# Patient Record
Sex: Female | Born: 1960
Health system: Southern US, Community
[De-identification: ages and names within clinical notes are randomized; demographics above are authoritative.]

## PROBLEM LIST (undated history)

## (undated) ENCOUNTER — Ambulatory Visit

## (undated) DIAGNOSIS — IMO0002 Reserved for concepts with insufficient information to code with codable children: Secondary | ICD-10-CM

## (undated) DIAGNOSIS — Z803 Family history of malignant neoplasm of breast: Secondary | ICD-10-CM

## (undated) DIAGNOSIS — Z800 Family history of malignant neoplasm of digestive organs: Secondary | ICD-10-CM

## (undated) DIAGNOSIS — R011 Cardiac murmur, unspecified: Secondary | ICD-10-CM

## (undated) DIAGNOSIS — E785 Hyperlipidemia, unspecified: Secondary | ICD-10-CM

## (undated) DIAGNOSIS — I10 Essential (primary) hypertension: Secondary | ICD-10-CM

## (undated) DIAGNOSIS — E119 Type 2 diabetes mellitus without complications: Secondary | ICD-10-CM

## (undated) DIAGNOSIS — R42 Dizziness and giddiness: Secondary | ICD-10-CM

## (undated) DIAGNOSIS — Z8041 Family history of malignant neoplasm of ovary: Secondary | ICD-10-CM

## (undated) DIAGNOSIS — K219 Gastro-esophageal reflux disease without esophagitis: Secondary | ICD-10-CM

## (undated) DIAGNOSIS — T7840XA Allergy, unspecified, initial encounter: Secondary | ICD-10-CM

## (undated) DIAGNOSIS — Z807 Family history of other malignant neoplasms of lymphoid, hematopoietic and related tissues: Secondary | ICD-10-CM

## (undated) DIAGNOSIS — R87619 Unspecified abnormal cytological findings in specimens from cervix uteri: Secondary | ICD-10-CM

## (undated) DIAGNOSIS — N939 Abnormal uterine and vaginal bleeding, unspecified: Secondary | ICD-10-CM

## (undated) HISTORY — DX: Hyperlipidemia, unspecified: E78.5

## (undated) HISTORY — DX: Family history of malignant neoplasm of breast: Z80.3

## (undated) HISTORY — PX: TUBAL LIGATION: SHX77

## (undated) HISTORY — DX: Type 2 diabetes mellitus without complications: E11.9

## (undated) HISTORY — DX: Family history of other malignant neoplasms of lymphoid, hematopoietic and related tissues: Z80.7

## (undated) HISTORY — PX: KNEE ARTHROSCOPY W/ ACL RECONSTRUCTION: SHX1858

## (undated) HISTORY — PX: KNEE SURGERY: SHX244

## (undated) HISTORY — DX: Cardiac murmur, unspecified: R01.1

## (undated) HISTORY — PX: COLPOSCOPY: SHX161

## (undated) HISTORY — DX: Family history of malignant neoplasm of ovary: Z80.41

## (undated) HISTORY — PX: DILATION AND CURETTAGE OF UTERUS: SHX78

## (undated) HISTORY — DX: Reserved for concepts with insufficient information to code with codable children: IMO0002

## (undated) HISTORY — DX: Allergy, unspecified, initial encounter: T78.40XA

## (undated) HISTORY — DX: Unspecified abnormal cytological findings in specimens from cervix uteri: R87.619

## (undated) HISTORY — DX: Family history of malignant neoplasm of digestive organs: Z80.0

---

## 2000-02-06 ENCOUNTER — Encounter (INDEPENDENT_AMBULATORY_CARE_PROVIDER_SITE_OTHER): Payer: Self-pay | Admitting: Specialist

## 2000-02-06 ENCOUNTER — Other Ambulatory Visit: Admission: RE | Admit: 2000-02-06 | Discharge: 2000-02-06 | Payer: Self-pay | Admitting: *Deleted

## 2000-09-20 ENCOUNTER — Other Ambulatory Visit: Admission: RE | Admit: 2000-09-20 | Discharge: 2000-09-20 | Payer: Self-pay | Admitting: *Deleted

## 2001-11-13 ENCOUNTER — Other Ambulatory Visit: Admission: RE | Admit: 2001-11-13 | Discharge: 2001-11-13 | Payer: Self-pay | Admitting: *Deleted

## 2002-11-13 ENCOUNTER — Other Ambulatory Visit: Admission: RE | Admit: 2002-11-13 | Discharge: 2002-11-13 | Payer: Self-pay | Admitting: Obstetrics and Gynecology

## 2004-08-24 ENCOUNTER — Other Ambulatory Visit: Admission: RE | Admit: 2004-08-24 | Discharge: 2004-08-24 | Payer: Self-pay | Admitting: Obstetrics and Gynecology

## 2005-09-15 ENCOUNTER — Other Ambulatory Visit: Admission: RE | Admit: 2005-09-15 | Discharge: 2005-09-15 | Payer: Self-pay | Admitting: Obstetrics and Gynecology

## 2009-02-07 ENCOUNTER — Emergency Department (HOSPITAL_BASED_OUTPATIENT_CLINIC_OR_DEPARTMENT_OTHER): Admission: EM | Admit: 2009-02-07 | Discharge: 2009-02-07 | Payer: Self-pay | Admitting: Emergency Medicine

## 2009-05-04 ENCOUNTER — Ambulatory Visit: Payer: Self-pay | Admitting: Diagnostic Radiology

## 2009-05-04 ENCOUNTER — Emergency Department (HOSPITAL_BASED_OUTPATIENT_CLINIC_OR_DEPARTMENT_OTHER): Admission: EM | Admit: 2009-05-04 | Discharge: 2009-05-04 | Payer: Self-pay | Admitting: Emergency Medicine

## 2009-10-21 ENCOUNTER — Emergency Department (HOSPITAL_BASED_OUTPATIENT_CLINIC_OR_DEPARTMENT_OTHER): Admission: EM | Admit: 2009-10-21 | Discharge: 2009-10-21 | Payer: Self-pay | Admitting: Emergency Medicine

## 2011-02-14 ENCOUNTER — Emergency Department (INDEPENDENT_AMBULATORY_CARE_PROVIDER_SITE_OTHER): Payer: Self-pay

## 2011-02-14 ENCOUNTER — Emergency Department (HOSPITAL_BASED_OUTPATIENT_CLINIC_OR_DEPARTMENT_OTHER)
Admission: EM | Admit: 2011-02-14 | Discharge: 2011-02-14 | Disposition: A | Discharge status: Home / Self Care | Payer: Self-pay | Attending: Emergency Medicine | Admitting: Emergency Medicine

## 2011-02-14 DIAGNOSIS — M549 Dorsalgia, unspecified: Secondary | ICD-10-CM | POA: Insufficient documentation

## 2011-02-14 DIAGNOSIS — R0602 Shortness of breath: Secondary | ICD-10-CM | POA: Insufficient documentation

## 2011-02-14 DIAGNOSIS — M62838 Other muscle spasm: Secondary | ICD-10-CM | POA: Insufficient documentation

## 2011-02-14 DIAGNOSIS — E669 Obesity, unspecified: Secondary | ICD-10-CM | POA: Insufficient documentation

## 2011-02-14 DIAGNOSIS — M542 Cervicalgia: Secondary | ICD-10-CM | POA: Insufficient documentation

## 2011-02-14 LAB — POCT CARDIAC MARKERS: Myoglobin, poc: 68.7 ng/mL (ref 12–200)

## 2011-02-14 LAB — BASIC METABOLIC PANEL
CO2: 28 mEq/L (ref 19–32)
Calcium: 9.4 mg/dL (ref 8.4–10.5)
Chloride: 108 mEq/L (ref 96–112)
GFR calc Af Amer: >60 mL/min (ref >60)
Glucose, Bld: 110 mg/dL — ABNORMAL HIGH (ref 70–99)
Sodium: 144 mEq/L (ref 135–145)

## 2011-02-14 LAB — DIFFERENTIAL
Basophils Absolute: 0 10*3/uL (ref 0.0–0.1)
Basophils Relative: 0 % (ref 0–1)
Eosinophils Relative: 3 % (ref 0–5)
Lymphocytes Relative: 37 % (ref 12–46)
Monocytes Absolute: 0.6 10*3/uL (ref 0.1–1.0)
Monocytes Relative: 8 % (ref 3–12)

## 2011-02-14 LAB — CBC
HCT: 38 % (ref 36.0–46.0)
Hemoglobin: 12.7 g/dL (ref 12.0–15.0)
MCH: 29.1 pg (ref 26.0–34.0)
MCHC: 33.4 g/dL (ref 30.0–36.0)
RDW: 13.1 % (ref 11.5–15.5)

## 2011-03-15 LAB — URINALYSIS, ROUTINE W REFLEX MICROSCOPIC
Glucose, UA: NEGATIVE mg/dL
Hgb urine dipstick: NEGATIVE
Protein, ur: NEGATIVE mg/dL
Specific Gravity, Urine: 1.023 (ref 1.005–1.030)
Urobilinogen, UA: 1 mg/dL (ref 0.0–1.0)

## 2011-03-15 LAB — DIFFERENTIAL
Eosinophils Absolute: 0.3 10*3/uL (ref 0.0–0.7)
Lymphocytes Relative: 36 % (ref 12–46)
Lymphs Abs: 3 10*3/uL (ref 0.7–4.0)
Monocytes Relative: 7 % (ref 3–12)
Neutro Abs: 4.4 10*3/uL (ref 1.7–7.7)
Neutrophils Relative %: 52 % (ref 43–77)

## 2011-03-15 LAB — COMPREHENSIVE METABOLIC PANEL
ALT: 17 U/L (ref 0–35)
BUN: 13 mg/dL (ref 6–23)
CO2: 26 mEq/L (ref 19–32)
Calcium: 8.9 mg/dL (ref 8.4–10.5)
Creatinine, Ser: 0.7 mg/dL (ref 0.4–1.2)
GFR calc non Af Amer: >60 mL/min (ref >60)
Glucose, Bld: 86 mg/dL (ref 70–99)
Total Protein: 7.1 g/dL (ref 6.0–8.3)

## 2011-03-15 LAB — CBC
HCT: 38.5 % (ref 36.0–46.0)
Hemoglobin: 13.4 g/dL (ref 12.0–15.0)
MCHC: 34.6 g/dL (ref 30.0–36.0)
MCV: 87.8 fL (ref 78.0–100.0)
RBC: 4.39 MIL/uL (ref 3.87–5.11)
RDW: 13 % (ref 11.5–15.5)

## 2011-03-21 LAB — BASIC METABOLIC PANEL
Calcium: 9.3 mg/dL (ref 8.4–10.5)
Chloride: 108 mEq/L (ref 96–112)
Creatinine, Ser: 0.7 mg/dL (ref 0.4–1.2)
GFR calc Af Amer: >60 mL/min (ref >60)
Sodium: 142 mEq/L (ref 135–145)

## 2011-03-21 LAB — POCT CARDIAC MARKERS
CKMB, poc: 1.3 ng/mL (ref 1.0–8.0)
CKMB, poc: <1 ng/mL — ABNORMAL LOW (ref 1.0–8.0)
Myoglobin, poc: 99 ng/mL (ref 12–200)
Troponin i, poc: <0.05 ng/mL (ref 0.00–0.09)
Troponin i, poc: <0.05 ng/mL (ref 0.00–0.09)

## 2011-03-21 LAB — DIFFERENTIAL
Lymphs Abs: 2.4 10*3/uL (ref 0.7–4.0)
Monocytes Absolute: 0.5 10*3/uL (ref 0.1–1.0)
Monocytes Relative: 6 % (ref 3–12)
Neutro Abs: 4.9 10*3/uL (ref 1.7–7.7)
Neutrophils Relative %: 61 % (ref 43–77)

## 2011-03-21 LAB — CBC
Hemoglobin: 13.9 g/dL (ref 12.0–15.0)
RBC: 4.57 MIL/uL (ref 3.87–5.11)
WBC: 8.1 10*3/uL (ref 4.0–10.5)

## 2011-03-21 LAB — POCT B-TYPE NATRIURETIC PEPTIDE (BNP): B Natriuretic Peptide, POC: <5 pg/mL (ref 0–100)

## 2012-02-16 ENCOUNTER — Other Ambulatory Visit: Payer: Self-pay | Admitting: Obstetrics and Gynecology

## 2012-02-16 DIAGNOSIS — N6310 Unspecified lump in the right breast, unspecified quadrant: Secondary | ICD-10-CM

## 2012-02-20 ENCOUNTER — Ambulatory Visit (INDEPENDENT_AMBULATORY_CARE_PROVIDER_SITE_OTHER): Payer: Self-pay | Admitting: *Deleted

## 2012-02-20 VITALS — BP 122/86 | HR 72 | Temp 97.6°F | Ht 63.0 in | Wt 255.0 lb

## 2012-02-20 DIAGNOSIS — Z1239 Encounter for other screening for malignant neoplasm of breast: Secondary | ICD-10-CM

## 2012-02-20 NOTE — Patient Instructions (Signed)
Taught patient how to perform BSE and gave educational materials to take home. Patient did not need a Pap smear today due to last Pap smear was 02/05/12 at the free Pap smear screening at the [facility]. Patient stated she has a history of abnormal Pap smears. Told her based on her recent result will depend on the follow up needed and when next Pap smear will be recommended. Let patient know have not received Pap smear result and that will get a letter if normal and a phone call if abnormal. Let her know [facility] will cover follow up if needed.  Patient is scheduled for a diagnostic mammogram and possible right breast ultrasound tomorrow February 21, 2012 at 1430 at the [facility]. Patient aware of appointment and will be there. Let patient know will follow up with her within the next couple weeks with results. Patient verbalized understanding.

## 2012-02-20 NOTE — Progress Notes (Signed)
Patient referred to [facility] due to two lumps palpated at the free Breast screening at the [facility] on 02/05/12  Pap Smear:    Pap smear not performed today. Patients last Pap smear was 02/05/12 at the [facility] free Pap smear screening and awaiting results. Per patient has had multiple abnormal Pap smears. Patient stated prior to recent Pap smear that last Pap smear was in 2006 and was abnormal requiring a colposcopy for follow up. No Pap smear results in EPIC.  Physical exam: Breasts Breasts symmetrical. No skin abnormalities bilateral breasts. No nipple retraction bilateral breasts. No nipple discharge bilateral breasts. No lymphadenopathy. Palpated two small lumps in right breast around 9 o'clock next to areola and around 10 o'clock. See attached scanned report with description.Palpated a small pea sized lump in the left axilla. Patient complained of tenderness in the left axilla when palpated lump. Otherwise no complaints of pain or tenderness on exam. Patient referred to the [facility] for Diagnostic Mammogram and possible breast ultrasound Wednesday, February 21, 2012 at 1430.         Pelvic/Bimanual No Pap smear completed today since last Pap smear was 02/05/12. Pap smear not indicated per [facility] guidelines.

## 2012-02-21 ENCOUNTER — Ambulatory Visit
Admission: RE | Admit: 2012-02-21 | Discharge: 2012-02-21 | Disposition: A | Discharge status: Home / Self Care | Payer: No Typology Code available for payment source | Source: Ambulatory Visit | Attending: Obstetrics and Gynecology | Admitting: Obstetrics and Gynecology

## 2012-02-21 ENCOUNTER — Other Ambulatory Visit: Payer: Self-pay | Admitting: Obstetrics and Gynecology

## 2012-02-21 DIAGNOSIS — N6310 Unspecified lump in the right breast, unspecified quadrant: Secondary | ICD-10-CM

## 2012-02-27 ENCOUNTER — Telehealth: Payer: Self-pay | Admitting: *Deleted

## 2012-02-27 NOTE — Telephone Encounter (Signed)
Telephoned pt at home #. Spoke with patient and advised pap smear results were normal and diagnostic mammogram was benign.

## 2015-08-21 ENCOUNTER — Encounter (HOSPITAL_BASED_OUTPATIENT_CLINIC_OR_DEPARTMENT_OTHER): Payer: Self-pay | Admitting: Emergency Medicine

## 2015-08-21 ENCOUNTER — Emergency Department (HOSPITAL_BASED_OUTPATIENT_CLINIC_OR_DEPARTMENT_OTHER): Payer: Self-pay

## 2015-08-21 ENCOUNTER — Emergency Department (HOSPITAL_BASED_OUTPATIENT_CLINIC_OR_DEPARTMENT_OTHER)
Admission: EM | Admit: 2015-08-21 | Discharge: 2015-08-21 | Disposition: A | Discharge status: Home / Self Care | Payer: Self-pay | Attending: Emergency Medicine | Admitting: Emergency Medicine

## 2015-08-21 DIAGNOSIS — R42 Dizziness and giddiness: Secondary | ICD-10-CM | POA: Insufficient documentation

## 2015-08-21 DIAGNOSIS — R011 Cardiac murmur, unspecified: Secondary | ICD-10-CM | POA: Insufficient documentation

## 2015-08-21 DIAGNOSIS — Z79899 Other long term (current) drug therapy: Secondary | ICD-10-CM | POA: Insufficient documentation

## 2015-08-21 LAB — CBC
HCT: 41 % (ref 36.0–46.0)
Hemoglobin: 13.1 g/dL (ref 12.0–15.0)
MCH: 28.5 pg (ref 26.0–34.0)
MCHC: 32 g/dL (ref 30.0–36.0)
MCV: 89.1 fL (ref 78.0–100.0)
PLATELETS: 225 10*3/uL (ref 150–400)
RBC: 4.6 MIL/uL (ref 3.87–5.11)
RDW: 13.4 % (ref 11.5–15.5)
WBC: 7.9 10*3/uL (ref 4.0–10.5)

## 2015-08-21 LAB — DIFFERENTIAL
Basophils Absolute: 0 10*3/uL (ref 0.0–0.1)
Basophils Relative: 0 % (ref 0–1)
Eosinophils Absolute: 0.2 10*3/uL (ref 0.0–0.7)
Eosinophils Relative: 3 % (ref 0–5)
LYMPHS PCT: 48 % — AB (ref 12–46)
Lymphs Abs: 3.9 10*3/uL (ref 0.7–4.0)
Monocytes Absolute: 0.7 10*3/uL (ref 0.1–1.0)
Monocytes Relative: 9 % (ref 3–12)
NEUTROS PCT: 40 % — AB (ref 43–77)
Neutro Abs: 3.1 10*3/uL (ref 1.7–7.7)

## 2015-08-21 LAB — PROTIME-INR
INR: 0.95 (ref 0.00–1.49)
PROTHROMBIN TIME: 12.9 s (ref 11.6–15.2)

## 2015-08-21 LAB — COMPREHENSIVE METABOLIC PANEL
ALBUMIN: 3.6 g/dL (ref 3.5–5.0)
ALK PHOS: 51 U/L (ref 38–126)
ALT: 18 U/L (ref 14–54)
AST: 24 U/L (ref 15–41)
Anion gap: 7 (ref 5–15)
BUN: 7 mg/dL (ref 6–20)
CALCIUM: 8.8 mg/dL — AB (ref 8.9–10.3)
CO2: 28 mmol/L (ref 22–32)
CREATININE: 0.75 mg/dL (ref 0.44–1.00)
Chloride: 108 mmol/L (ref 101–111)
GFR calc Af Amer: >60 mL/min (ref >60)
GFR calc non Af Amer: >60 mL/min (ref >60)
GLUCOSE: 104 mg/dL — AB (ref 65–99)
Potassium: 3.3 mmol/L — ABNORMAL LOW (ref 3.5–5.1)
SODIUM: 143 mmol/L (ref 135–145)
Total Bilirubin: 0.4 mg/dL (ref 0.3–1.2)
Total Protein: 6.9 g/dL (ref 6.5–8.1)

## 2015-08-21 LAB — APTT: aPTT: 30 seconds (ref 24–37)

## 2015-08-21 LAB — TROPONIN I: Troponin I: <0.03 ng/mL (ref <0.031)

## 2015-08-21 LAB — CBG MONITORING, ED: Glucose-Capillary: 84 mg/dL (ref 65–99)

## 2015-08-21 MED ORDER — MECLIZINE HCL 25 MG PO TABS
50.0000 mg | ORAL_TABLET | Freq: Once | ORAL | Status: AC
  Administered 2015-08-21: 50 mg via ORAL
  Filled 2015-08-21: qty 2

## 2015-08-21 MED ORDER — ONDANSETRON HCL 4 MG/2ML IJ SOLN
4.0000 mg | Freq: Once | INTRAMUSCULAR | Status: AC
  Administered 2015-08-21: 4 mg via INTRAVENOUS
  Filled 2015-08-21: qty 2

## 2015-08-21 MED ORDER — PROMETHAZINE HCL 25 MG PO TABS: 25.0000 mg | ORAL_TABLET | Freq: Four times a day (QID) | ORAL | Status: DC | PRN

## 2015-08-21 MED ORDER — PROMETHAZINE HCL 25 MG/ML IJ SOLN
25.0000 mg | Freq: Once | INTRAMUSCULAR | Status: AC
  Administered 2015-08-21: 25 mg via INTRAVENOUS
  Filled 2015-08-21: qty 1

## 2015-08-21 MED ORDER — SODIUM CHLORIDE 0.9 % IV BOLUS (SEPSIS)
1000.0000 mL | Freq: Once | INTRAVENOUS | Status: AC
  Administered 2015-08-21: 1000 mL via INTRAVENOUS

## 2015-08-21 NOTE — ED Notes (Signed)
MD at bedside. 

## 2015-08-21 NOTE — Discharge Instructions (Signed)
Return without fail for worsening symptoms, including speech or vision changes, difficulty walking, or any other symptoms concerning to you.  Benign Positional Vertigo Vertigo means you feel like you or your surroundings are moving when they are not. Benign positional vertigo is the most common form of vertigo. Benign means that the cause of your condition is not serious. Benign positional vertigo is more common in older adults. CAUSES  Benign positional vertigo is the result of an upset in the labyrinth system. This is an area in the middle ear that helps control your balance. This may be caused by a viral infection, head injury, or repetitive motion. However, often no specific cause is found. SYMPTOMS  Symptoms of benign positional vertigo occur when you move your head or eyes in different directions. Some of the symptoms may include:  Loss of balance and falls.  Vomiting.  Blurred vision.  Dizziness.  Nausea.  Involuntary eye movements (nystagmus). DIAGNOSIS  Benign positional vertigo is usually diagnosed by physical exam. If the specific cause of your benign positional vertigo is unknown, your caregiver may perform imaging tests, such as magnetic resonance imaging (MRI) or computed tomography (CT). TREATMENT  Your caregiver may recommend movements or procedures to correct the benign positional vertigo. Medicines such as meclizine, benzodiazepines, and medicines for nausea may be used to treat your symptoms. In rare cases, if your symptoms are caused by certain conditions that affect the inner ear, you may need surgery. HOME CARE INSTRUCTIONS   Follow your caregiver's instructions.  Move slowly. Do not make sudden body or head movements.  Avoid driving.  Avoid operating heavy machinery.  Avoid performing any tasks that would be dangerous to you or others during a vertigo episode.  Drink enough fluids to keep your urine clear or pale yellow. SEEK IMMEDIATE MEDICAL CARE IF:    You develop problems with walking, weakness, numbness, or using your arms, hands, or legs.  You have difficulty speaking.  You develop severe headaches.  Your nausea or vomiting continues or gets worse.  You develop visual changes.  Your family or friends notice any behavioral changes.  Your condition gets worse.  You have a fever.  You develop a stiff neck or sensitivity to light. MAKE SURE YOU:   Understand these instructions.  Will watch your condition.  Will get help right away if you are not doing well or get worse. Document Released: 09/04/2006 Document Revised: 02/19/2012 Document Reviewed: 08/17/2011 [facility] Patient Information 2015 [place], [place]. This information is not intended to replace advice given to you by your health care provider. Make sure you discuss any questions you have with your health care provider.  Dizziness  Dizziness means you feel unsteady or lightheaded. You might feel like you are going to pass out (faint). HOME CARE   Drink enough fluids to keep your pee (urine) clear or pale yellow.  Take your medicines exactly as told by your doctor. If you take blood pressure medicine, always stand up slowly from the lying or sitting position. Hold on to something to steady yourself.  If you need to stand in one place for a long time, move your legs often. Tighten and relax your leg muscles.  Have someone stay with you until you feel okay.  Do not drive or use heavy machinery if you feel dizzy.  Do not drink alcohol. GET HELP RIGHT AWAY IF:   You feel dizzy or lightheaded and it gets worse.  You feel sick to your stomach (nauseous), or you  throw up (vomit).  You have trouble talking or walking.  You feel weak or have trouble using your arms, hands, or legs.  You cannot think clearly or have trouble forming sentences.  You have chest pain, belly (abdominal) pain, sweating, or you are short of breath.  Your vision changes.  You are  bleeding.  You have problems from your medicine that seem to be getting worse. MAKE SURE YOU:   Understand these instructions.  Will watch your condition.  Will get help right away if you are not doing well or get worse. Document Released: 11/16/2011 Document Revised: 02/19/2012 Document Reviewed: 11/16/2011 [facility] Patient Information 2015 [place], [place]. This information is not intended to replace advice given to you by your health care provider. Make sure you discuss any questions you have with your health care provider.

## 2015-08-21 NOTE — ED Notes (Signed)
Dizziness & nausea resolved

## 2015-08-21 NOTE — ED Notes (Signed)
Pt states she has felt dizzy off and on since about 1110 this morning on her way to work, along with nausea and "a funny feeling" in both eyes.

## 2015-08-21 NOTE — ED Notes (Signed)
Patient transported to CT 

## 2015-08-21 NOTE — ED Provider Notes (Signed)
CSN: [id number]     Arrival date & time 08/21/15  1252 History   First MD Initiated Contact with Patient 08/21/15 1339     Chief Complaint  Patient presents with  . Dizziness     (Consider location/radiation/quality/duration/timing/severity/associated sxs/prior Treatment) HPI 54 year old female who presents with dizziness. History of obesity. Reports that around 11 AM today, she experienced intermittent dizziness. Reports that whenever she would hitting her head backwards related backwards she would have the sensation that the room was spinning. This is associated with diaphoresis and nausea. She had some associating gait instability due to the dizziness. Reports that this would get better, but worse again when she would lay her head backwards. Reports that in 2010 she was diagnosed with vertigo, but this was not a recurrent issue and feels like symptoms today may be similar. Has also had recent mild cold with congestion and runny nose, but symptoms have now resolved. Denies any headache, double vision, dysarthria or aphasia, focal weakness or numbness, or dysphasia. Denies any recent trauma.    Past Medical History  Diagnosis Date  . Abnormal Pap smear     colposcopy  . Heart murmur    Past Surgical History  Procedure Laterality Date  . Dilation and curettage of uterus    . Knee surgery    . Colposcopy     Family History  Problem Relation Age of Onset  . Heart disease Paternal Grandmother   . Hypertension Paternal Grandmother   . Diabetes Maternal Grandmother   . Hypertension Maternal Grandmother   . Cancer Father     colon  . Breast cancer Mother   . Heart disease Mother   . Diabetes Mother   . Hypertension Mother   . Heart disease Brother   . Diabetes Brother   . Diabetes Sister   . Diabetes Brother   . Hodgkin's lymphoma Sister    Social History  Substance Use Topics  . Smoking status: Never Smoker   . Smokeless tobacco: Never Used  . Alcohol Use: No   OB  History    Gravida Para Term Preterm AB TAB SAB Ectopic Multiple Living   3 3 3       3      Review of Systems 10/14 systems reviewed and are negative other than those stated in the HPI   Allergies  Iodine  Home Medications   Prior to Admission medications   Medication Sig Start Date End Date Taking? Authorizing Provider  Multiple Vitamin (MULTIVITAMIN) tablet Take 1 tablet by mouth daily.    Historical Provider, MD   BP 116/74 mmHg  Pulse 62  Temp(Src) 98.5 F (36.9 C) (Oral)  Resp 19  Ht 5\' 3"  (1.6 m)  Wt 278 lb (126.1 kg)  BMI 49.26 kg/m2  SpO2 100%  LMP  (LMP Unknown) Physical Exam Physical Exam  Nursing note and vitals reviewed. Constitutional: Well developed, well nourished, non-toxic, and in no acute distress Head: Normocephalic and atraumatic.  Mouth/Throat: Oropharynx is clear and moist.  Neck: Normal range of motion. Neck supple.  Cardiovascular: Normal rate and regular rhythm.   Pulmonary/Chest: Effort normal and breath sounds normal.  Abdominal: Soft. There is no tenderness. There is no rebound and no guarding.  Musculoskeletal: Normal range of motion.  Skin: Skin is warm and dry.  Psychiatric: Cooperative Neurological:  Alert, oriented to person, place, time, and situation. Memory grossly in tact. Fluent speech. No dysarthria or aphasia.  Cranial nerves: VF are full. Fundoscopic exam-unable to get good  visualization of the discs. Pupils are symmetric, and reactive to light. EOMI without nystagmus. No abnormal test of skew. No gaze deviation. Facial muscles symmetric with activation. Sensation to light touch over face in tact bilaterally. Hearing grossly in tact. Palate elevates symmetrically. Head turn and shoulder shrug are intact. Tongue midline.  Muscle bulk and tone normal. No pronator drift. Moves all extremities symmetrically. No drift with leg raise bilaterally. Sensation to light touch is in tact throughout in bilateral upper and lower  extremities. Coordination reveals no dysmetria with finger to nose. Gait is narrow-based and steady. Non-ataxic.   ED Course  Procedures (including critical care time) Labs Review Labs Reviewed  DIFFERENTIAL - Abnormal; Notable for the following:    Neutrophils Relative % 40 (*)    Lymphocytes Relative 48 (*)    All other components within normal limits  COMPREHENSIVE METABOLIC PANEL - Abnormal; Notable for the following:    Potassium 3.3 (*)    Glucose, Bld 104 (*)    Calcium 8.8 (*)    All other components within normal limits  PROTIME-INR  APTT  CBC  TROPONIN I  CBG MONITORING, ED    Imaging Review Ct Head Wo Contrast  08/21/2015   CLINICAL DATA:  Dizziness and nausea  EXAM: CT HEAD WITHOUT CONTRAST  TECHNIQUE: Contiguous axial images were obtained from the base of the skull through the vertex without intravenous contrast.  COMPARISON:  None.  FINDINGS: No acute hemorrhage, infarct, or mass lesion is identified. No midline shift. Ventricles are normal in size. Orbits are unremarkable. No skull fracture. Mild ethmoid sinusitis.  IMPRESSION: No acute intracranial finding.   Electronically Signed   By: Conchita Paris M.D.   On: 08/21/2015 14:28   I have personally reviewed and evaluated these images and lab results as part of my medical decision-making.   EKG Interpretation   Date/Time:  Saturday August 21 2015 13:17:00 EDT Ventricular Rate:  62 PR Interval:  158 QRS Duration: 94 QT Interval:  446 QTC Calculation: 452 R Axis:   46 Text Interpretation:  Normal sinus rhythm Normal ECG No significant change  since last tracing Confirmed by Danette Weinfeld MD, Hinton Dyer ([id number]) on 08/21/2015 1:17:26  PM      MDM   Final diagnoses:  Vertigo    In short, this is a 54 year old female who presents with dizziness, consistent with vertigo. Vital signs are within normal limits on arrival. She is not toxic and in no acute distress. Symptoms reproducible when she is laid flat. Her  neurological exam is intact. No bidirectional or vertical nystagmus, no abnormal test of skew, no dysmetria or abnormal coordination testing, and no ataxia with ambulation. Findings are not consistent with central etiology of her vertigo. The patient has history of peripheral vertigo, and given that symptoms are induced by certain positional changes, this is most consistent with peripheral vertigo.`CT head in triage is unremarkable, and I do not suspect CVA or TIA. Blood work is unremarkable. She is given meclizine and Phenergan for symptoms, and continues to be able to ambulate steadily. She is felt appropriate for discharge home with supportive care. Strict return follow-up instructions are reviewed. She expressed understanding of all discharge instructions and felt comfortable to plan of care.   Forde Dandy, MD 08/21/15 1758

## 2017-05-21 ENCOUNTER — Other Ambulatory Visit: Payer: Self-pay

## 2017-05-25 LAB — CYTOLOGY - PAP: Diagnosis: NEGATIVE

## 2017-07-15 ENCOUNTER — Emergency Department (HOSPITAL_BASED_OUTPATIENT_CLINIC_OR_DEPARTMENT_OTHER): Payer: Self-pay

## 2017-07-15 ENCOUNTER — Encounter (HOSPITAL_BASED_OUTPATIENT_CLINIC_OR_DEPARTMENT_OTHER): Payer: Self-pay | Admitting: Adult Health

## 2017-07-15 ENCOUNTER — Emergency Department (HOSPITAL_BASED_OUTPATIENT_CLINIC_OR_DEPARTMENT_OTHER)
Admission: EM | Admit: 2017-07-15 | Discharge: 2017-07-15 | Disposition: A | Discharge status: Home / Self Care | Payer: Self-pay | Attending: Emergency Medicine | Admitting: Emergency Medicine

## 2017-07-15 DIAGNOSIS — H81399 Other peripheral vertigo, unspecified ear: Secondary | ICD-10-CM

## 2017-07-15 DIAGNOSIS — R42 Dizziness and giddiness: Secondary | ICD-10-CM | POA: Insufficient documentation

## 2017-07-15 HISTORY — DX: Dizziness and giddiness: R42

## 2017-07-15 LAB — BASIC METABOLIC PANEL
ANION GAP: 6 (ref 5–15)
BUN: 7 mg/dL (ref 6–20)
CALCIUM: 8.8 mg/dL — AB (ref 8.9–10.3)
CO2: 28 mmol/L (ref 22–32)
CREATININE: 0.68 mg/dL (ref 0.44–1.00)
Chloride: 106 mmol/L (ref 101–111)
GFR calc non Af Amer: >60 mL/min (ref >60)
Glucose, Bld: 121 mg/dL — ABNORMAL HIGH (ref 65–99)
Potassium: 3.5 mmol/L (ref 3.5–5.1)
SODIUM: 140 mmol/L (ref 135–145)

## 2017-07-15 LAB — CBC WITH DIFFERENTIAL/PLATELET
BASOS ABS: 0 10*3/uL (ref 0.0–0.1)
BASOS PCT: 1 %
EOS ABS: 0.2 10*3/uL (ref 0.0–0.7)
Eosinophils Relative: 3 %
HEMATOCRIT: 41.7 % (ref 36.0–46.0)
Hemoglobin: 13.6 g/dL (ref 12.0–15.0)
Lymphocytes Relative: 41 %
Lymphs Abs: 2.6 10*3/uL (ref 0.7–4.0)
MCH: 29.2 pg (ref 26.0–34.0)
MCHC: 32.6 g/dL (ref 30.0–36.0)
MCV: 89.5 fL (ref 78.0–100.0)
Monocytes Absolute: 0.5 10*3/uL (ref 0.1–1.0)
Monocytes Relative: 8 %
NEUTROS ABS: 3 10*3/uL (ref 1.7–7.7)
NEUTROS PCT: 47 %
Platelets: 224 10*3/uL (ref 150–400)
RBC: 4.66 MIL/uL (ref 3.87–5.11)
RDW: 13.4 % (ref 11.5–15.5)
WBC: 6.3 10*3/uL (ref 4.0–10.5)

## 2017-07-15 MED ORDER — LORAZEPAM 1 MG PO TABS: 1.0000 mg | ORAL_TABLET | Freq: Three times a day (TID) | ORAL | 0 refills | Status: DC | PRN

## 2017-07-15 MED ORDER — MECLIZINE HCL 25 MG PO TABS
25.0000 mg | ORAL_TABLET | Freq: Once | ORAL | Status: AC
  Administered 2017-07-15: 25 mg via ORAL
  Filled 2017-07-15: qty 1

## 2017-07-15 NOTE — ED Notes (Signed)
Pt on auto VS  

## 2017-07-15 NOTE — Discharge Instructions (Signed)
Ativan as prescribed as needed for dizziness.  Follow-up with your primary Dr. if not improving in the next week.

## 2017-07-15 NOTE — ED Provider Notes (Signed)
[facility] Provider Note   CSN: [id number] Arrival date & time: 07/15/17  1258     History   Chief Complaint Chief Complaint  Patient presents with  . Dizziness    HPI Anna Mann is a 56 y.o. female.  Patient is a 56 year old female with past medical history of vertigo. She presents today for evaluation of dizziness. She reports a spinning sensation that occurs primarily when she lies on her left side. It also occurs when she turns her head to the left. She denies any weakness, numbness of the extremities. She denies any visual disturbances. She denies any headache.   The history is provided by the patient.  Dizziness  Quality:  Head spinning and imbalance Severity:  Moderate Onset quality:  Sudden Duration:  8 hours Timing:  Intermittent Progression:  Worsening Chronicity:  New Context: head movement   Relieved by:  Being still Worsened by:  Movement Ineffective treatments:  None tried Associated symptoms: nausea   Associated symptoms: no blood in stool, no chest pain and no shortness of breath     Past Medical History:  Diagnosis Date  . Abnormal Pap smear    colposcopy  . Heart murmur   . Vertigo     There are no active problems to display for this patient.   Past Surgical History:  Procedure Laterality Date  . COLPOSCOPY    . DILATION AND CURETTAGE OF UTERUS    . KNEE SURGERY      OB History    Gravida Para Term Preterm AB Living   3 3 3     3    SAB TAB Ectopic Multiple Live Births                   Home Medications    Prior to Admission medications   Medication Sig Start Date End Date Taking? Authorizing Provider  Multiple Vitamin (MULTIVITAMIN) tablet Take 1 tablet by mouth daily.    [provider]  promethazine (PHENERGAN) 25 MG tablet Take 1 tablet (25 mg total) by mouth every 6 (six) hours as needed for nausea or vomiting (dizziness). 08/21/15   Forde Dandy, MD    Family History Family History  Problem  Relation Age of Onset  . Heart disease Paternal Grandmother   . Hypertension Paternal Grandmother   . Diabetes Maternal Grandmother   . Hypertension Maternal Grandmother   . Cancer Father        colon  . Breast cancer Mother   . Heart disease Mother   . Diabetes Mother   . Hypertension Mother   . Diabetes Sister   . Diabetes Brother   . Hodgkin's lymphoma Sister   . Heart disease Brother   . Diabetes Brother     Social History Social History  Substance Use Topics  . Smoking status: Never Smoker  . Smokeless tobacco: Never Used  . Alcohol use No     Allergies   Iodine   Review of Systems Review of Systems  Respiratory: Negative for shortness of breath.   Cardiovascular: Negative for chest pain.  Gastrointestinal: Positive for nausea. Negative for blood in stool.  Neurological: Positive for dizziness.  All other systems reviewed and are negative.    Physical Exam Updated Vital Signs BP (!) 150/91 (BP Location: Left Arm)   Pulse 83   Temp 98.6 F (37 C) (Oral)   Resp 18   Ht 5\' 3"  (1.6 m)   Wt 131.5 kg (290 lb)  LMP  (LMP Unknown) Comment: irregular  SpO2 97%   BMI 51.37 kg/m   Physical Exam  Constitutional: She is oriented to person, place, and time. She appears well-developed and well-nourished. No distress.  HENT:  Head: Normocephalic and atraumatic.  Eyes: Pupils are equal, round, and reactive to light. EOM are normal.  Neck: Normal range of motion. Neck supple.  Cardiovascular: Normal rate and regular rhythm.  Exam reveals no gallop and no friction rub.   No murmur heard. Pulmonary/Chest: Effort normal and breath sounds normal. No respiratory distress. She has no wheezes.  Abdominal: Soft. Bowel sounds are normal. She exhibits no distension. There is no tenderness.  Musculoskeletal: Normal range of motion.  Neurological: She is alert and oriented to person, place, and time. No cranial nerve deficit. She exhibits normal muscle tone. Coordination  normal.  Skin: Skin is warm and dry. She is not diaphoretic.  Nursing note and vitals reviewed.    ED Treatments / Results  Labs (all labs ordered are listed, but only abnormal results are displayed) Labs Reviewed  BASIC METABOLIC PANEL  CBC WITH DIFFERENTIAL/PLATELET    EKG  EKG Interpretation  Date/Time:  Sunday July 15 2017 13:17:32 EDT Ventricular Rate:  69 PR Interval:  166 QRS Duration: 92 QT Interval:  418 QTC Calculation: 447 R Axis:   43 Text Interpretation:  Normal sinus rhythm Normal ECG Confirmed by Veryl Speak [phone]) on 07/15/2017 1:31:49 PM       Radiology Ct Head Wo Contrast  Result Date: 07/15/2017 CLINICAL DATA:  Ataxia and dizziness beginning this morning. Suspected stroke. EXAM: CT HEAD WITHOUT CONTRAST TECHNIQUE: Contiguous axial images were obtained from the base of the skull through the vertex without intravenous contrast. COMPARISON:  08/21/2015 FINDINGS: Brain: No evidence of acute infarction, hemorrhage, hydrocephalus, extra-axial collection, or mass lesion/mass effect. Vascular:  No hyperdense vessel or other acute findings. Skull: No evidence of fracture or other significant bone abnormality. Sinuses/Orbits:  No acute findings. Other: Increased size of 1 cm polypoid mass in the left frontal scalp, which has nonspecific features. IMPRESSION: No acute intracranial abnormality. Increased size of 1 cm polypoid soft tissue mass in the left frontal scalp. Recommend dermatologic consultation and consider surgical excision. Electronically Signed   By: Earle Gell M.D.   On: 07/15/2017 14:40    Procedures Procedures (including critical care time)  Medications Ordered in ED Medications  meclizine (ANTIVERT) tablet 25 mg (25 mg Oral Given 07/15/17 1434)     Initial Impression / Assessment and Plan / ED Course  I have reviewed the triage vital signs and the nursing notes.  Pertinent labs & imaging results that were available during my care of the patient  were reviewed by me and considered in my medical decision making (see chart for details).  Patient presents with symptoms that are most consistent with peripheral vertigo. She is neurologically intact, head CT is negative, and laboratory studies are unremarkable. She was given meclizine, however it itched. She will be discharged with Ativan as needed for recurrent vertigo and follow-up as needed if she is not improving.  Final Clinical Impressions(s) / ED Diagnoses   Final diagnoses:  None    New Prescriptions New Prescriptions   No medications on file     Veryl Speak, MD 07/15/17 [phone]

## 2017-07-15 NOTE — ED Triage Notes (Signed)
PREsents with dizziness that occurs when patiemt is lying on left side only and then when she stands she feels off balance. She deneis numbness, tingling and weakness. NO facial droop or arm drift. Dizziness began this AM with waking. and is worse with lying on left side and when eyes are closed.  Denies pain.

## 2017-07-15 NOTE — ED Notes (Signed)
Pt c/o facial and LLE itching; no hives noted; NAD; EDP at bedside.

## 2017-08-10 ENCOUNTER — Encounter: Payer: Self-pay | Admitting: Obstetrics & Gynecology

## 2017-10-19 ENCOUNTER — Encounter (HOSPITAL_COMMUNITY): Payer: Self-pay

## 2018-10-20 IMAGING — CT CT HEAD W/O CM
3 series · 15 of 46 positions shown, 18 images · non-contrast
Comparison: 08/21/2015

CLINICAL DATA: Ataxia and dizziness beginning this morning.
Suspected stroke.

EXAM:
CT HEAD WITHOUT CONTRAST
TECHNIQUE: Contiguous axial images were obtained from the base of the skull
through the vertex without intravenous contrast.

[Series 2: head wo · axial · 0.39mm/px · z∈[-10,+110]mm · 9 of 29 slices shown, 12 images]
[im 3/29  brain]
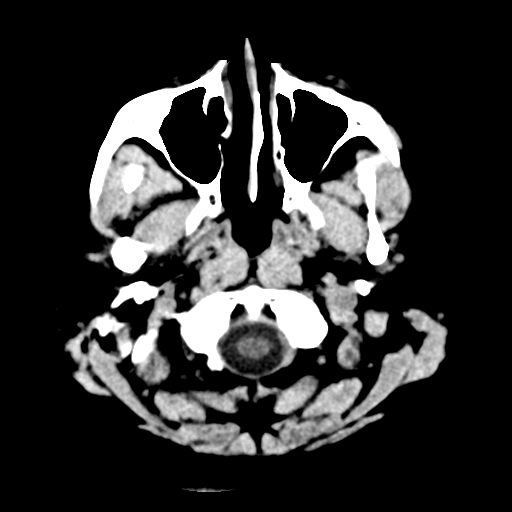
[im 3/29  bone]
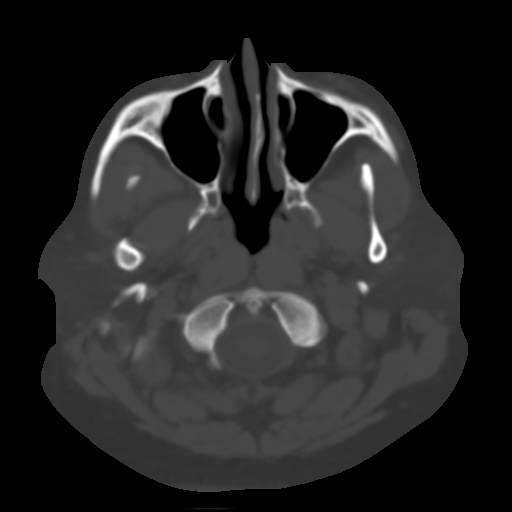
[im 6/29  brain]
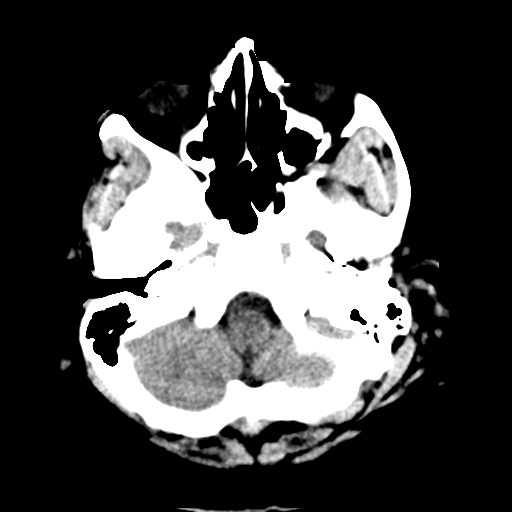
[im 9/29  brain]
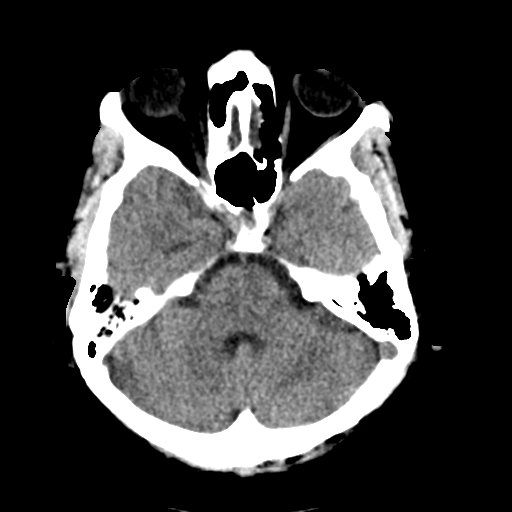
[im 12/29  brain]
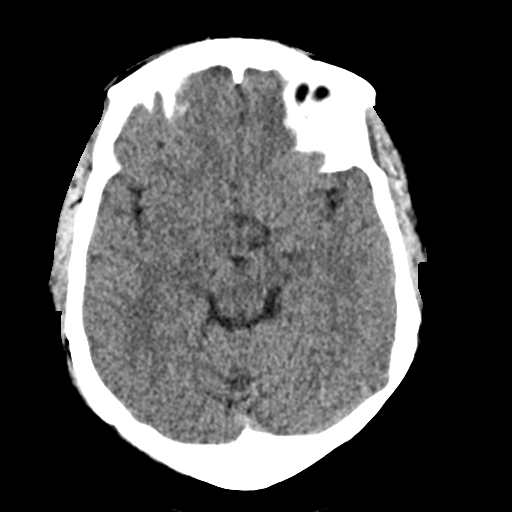
[im 15/29  brain]
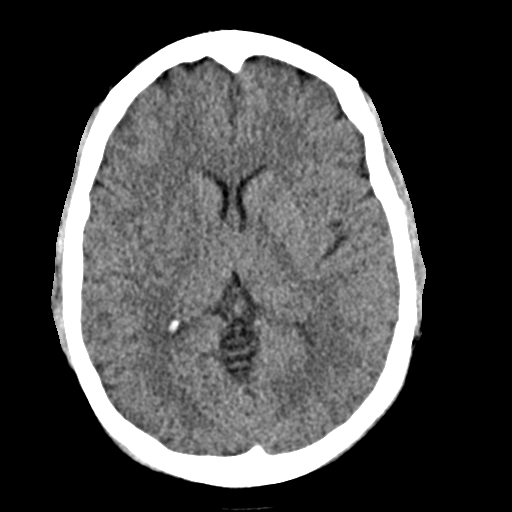
[im 15/29  bone]
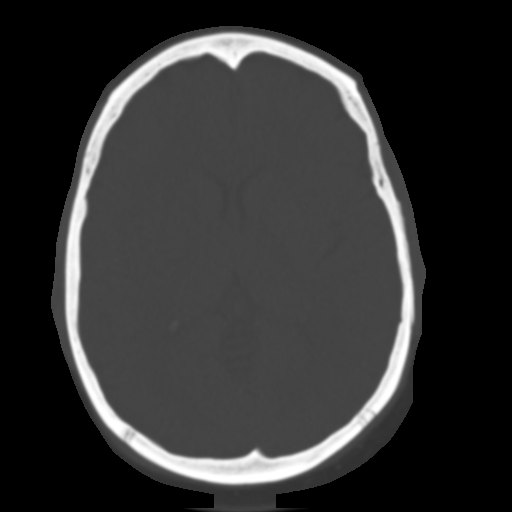
[im 18/29  brain]
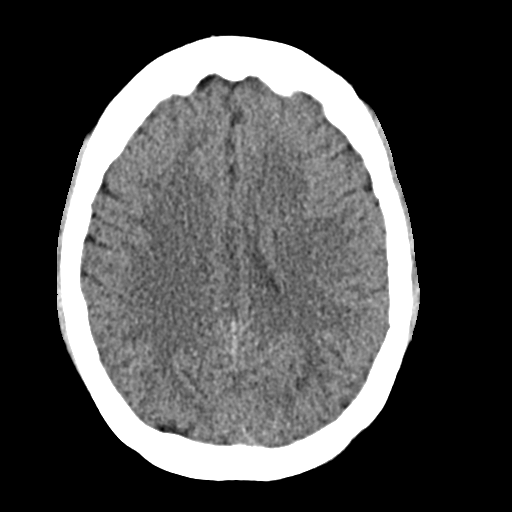
[im 21/29  brain]
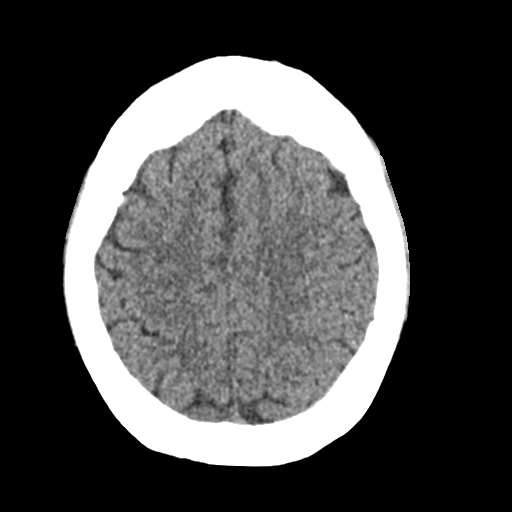
[im 24/29  brain]
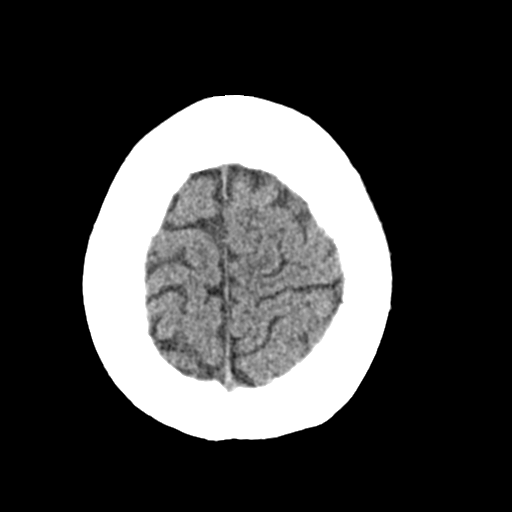
[im 27/29  brain]
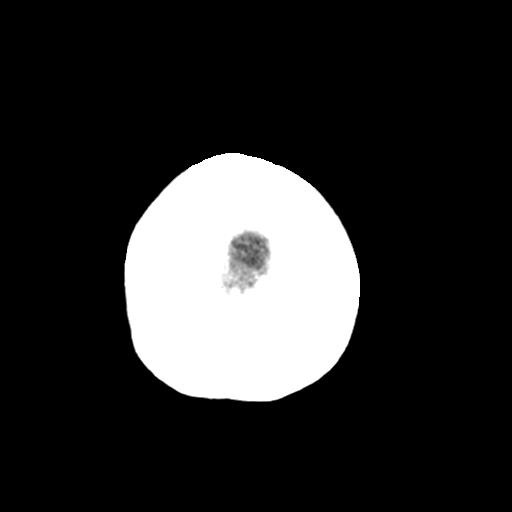
[im 27/29  bone]
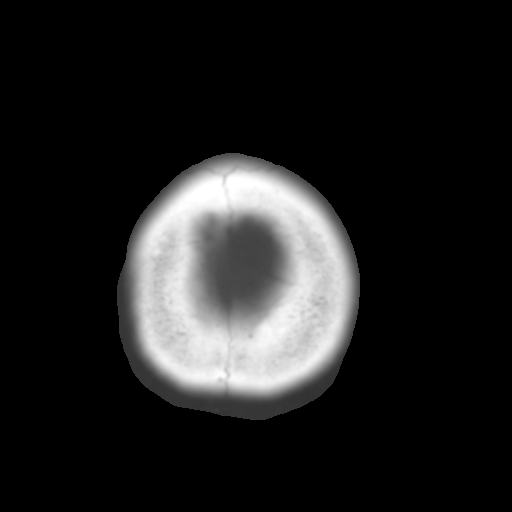

[Series 4: coronal soft · coronal · 0.31mm/px · 3 of 61 slices shown]
[im 21/61  brain]
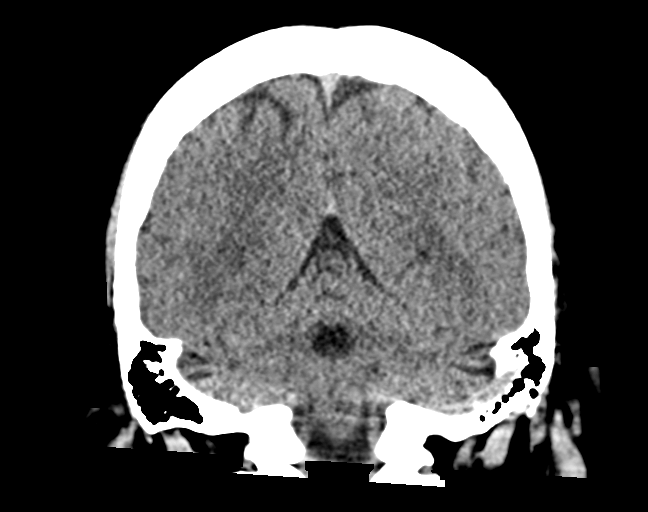
[im 27/61  brain]
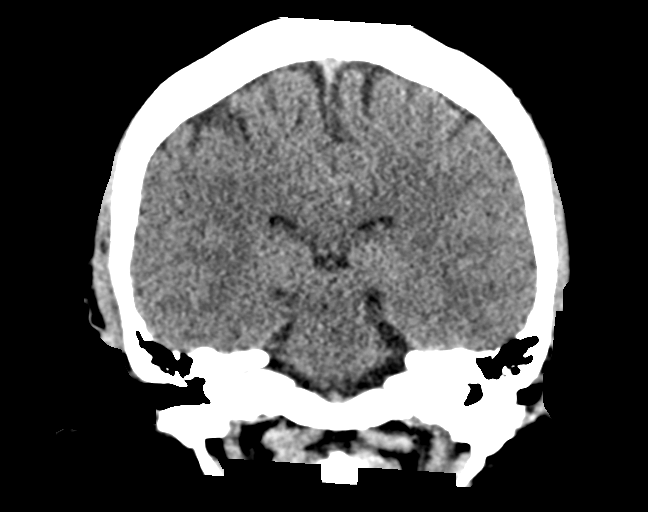
[im 34/61  brain]
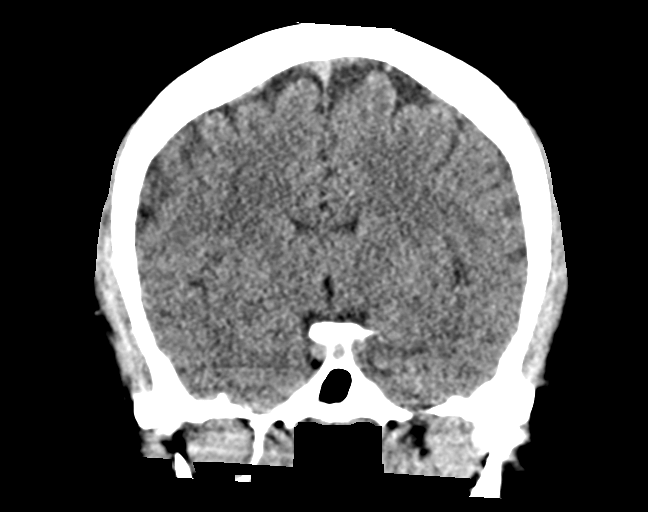

[Series 5: sag soft · sagittal · 0.31mm/px · 3 of 67 slices shown]
[im 23/67  brain]
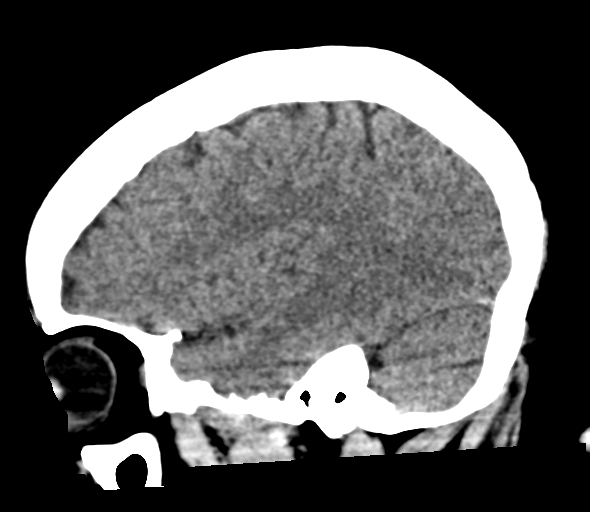
[im 34/67  brain]
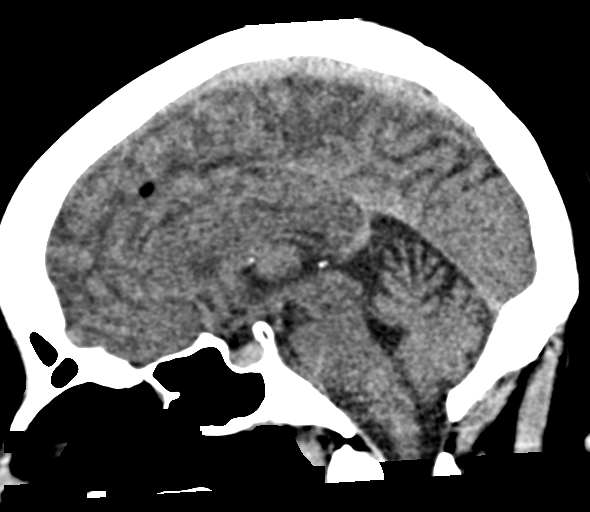
[im 45/67  brain]
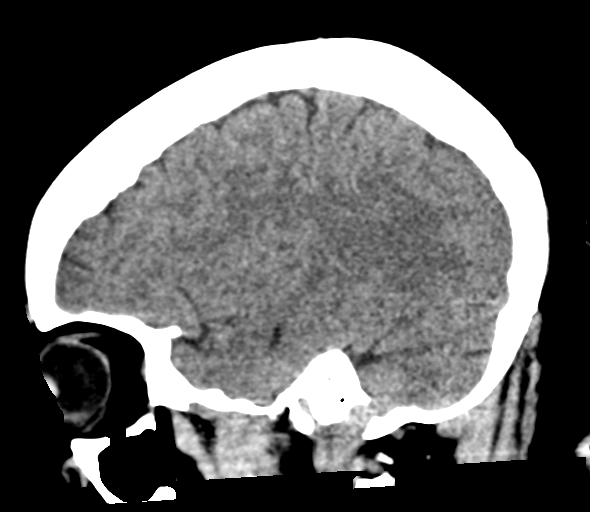

[15 of 46 positions shown; findings below may reference images not displayed]

FINDINGS: Brain: No evidence of acute infarction, hemorrhage, hydrocephalus,
extra-axial collection, or mass lesion/mass effect.

Vascular:  No hyperdense vessel or other acute findings.

Skull: No evidence of fracture or other significant bone
abnormality.

Sinuses/Orbits:  No acute findings.

Other: Increased size of 1 cm polypoid mass in the left frontal
scalp, which has nonspecific features.
IMPRESSION: No acute intracranial abnormality.

Increased size of 1 cm polypoid soft tissue mass in the left frontal
scalp. Recommend dermatologic consultation and consider surgical
excision.

## 2019-09-05 ENCOUNTER — Other Ambulatory Visit: Payer: Self-pay

## 2019-09-05 ENCOUNTER — Emergency Department (HOSPITAL_COMMUNITY): Payer: Self-pay

## 2019-09-05 ENCOUNTER — Emergency Department (HOSPITAL_COMMUNITY)
Admission: EM | Admit: 2019-09-05 | Discharge: 2019-09-05 | Disposition: A | Discharge status: Home / Self Care | Payer: Self-pay | Attending: Emergency Medicine | Admitting: Emergency Medicine

## 2019-09-05 ENCOUNTER — Encounter (HOSPITAL_COMMUNITY): Payer: Self-pay

## 2019-09-05 DIAGNOSIS — N939 Abnormal uterine and vaginal bleeding, unspecified: Secondary | ICD-10-CM | POA: Insufficient documentation

## 2019-09-05 DIAGNOSIS — R1013 Epigastric pain: Secondary | ICD-10-CM | POA: Insufficient documentation

## 2019-09-05 DIAGNOSIS — Z79899 Other long term (current) drug therapy: Secondary | ICD-10-CM | POA: Insufficient documentation

## 2019-09-05 LAB — COMPREHENSIVE METABOLIC PANEL
ALT: 22 U/L (ref 0–44)
AST: 23 U/L (ref 15–41)
Albumin: 3.8 g/dL (ref 3.5–5.0)
Alkaline Phosphatase: 57 U/L (ref 38–126)
Anion gap: 11 (ref 5–15)
BUN: 5 mg/dL — ABNORMAL LOW (ref 6–20)
CO2: 27 mmol/L (ref 22–32)
Calcium: 9.2 mg/dL (ref 8.9–10.3)
Chloride: 105 mmol/L (ref 98–111)
Creatinine, Ser: 0.71 mg/dL (ref 0.44–1.00)
GFR calc Af Amer: >60 mL/min (ref >60)
GFR calc non Af Amer: >60 mL/min (ref >60)
Glucose, Bld: 163 mg/dL — ABNORMAL HIGH (ref 70–99)
Potassium: 4.4 mmol/L (ref 3.5–5.1)
Sodium: 143 mmol/L (ref 135–145)
Total Bilirubin: 0.6 mg/dL (ref 0.3–1.2)
Total Protein: 6.8 g/dL (ref 6.5–8.1)

## 2019-09-05 LAB — CBC
HCT: 42.8 % (ref 36.0–46.0)
Hemoglobin: 13.9 g/dL (ref 12.0–15.0)
MCH: 29.4 pg (ref 26.0–34.0)
MCHC: 32.5 g/dL (ref 30.0–36.0)
MCV: 90.5 fL (ref 80.0–100.0)
Platelets: 229 10*3/uL (ref 150–400)
RBC: 4.73 MIL/uL (ref 3.87–5.11)
RDW: 12.9 % (ref 11.5–15.5)
WBC: 5.8 10*3/uL (ref 4.0–10.5)
nRBC: 0 % (ref 0.0–0.2)

## 2019-09-05 LAB — I-STAT BETA HCG BLOOD, ED (MC, WL, AP ONLY): I-stat hCG, quantitative: <5 m[IU]/mL (ref <5)

## 2019-09-05 LAB — LIPASE, BLOOD: Lipase: 25 U/L (ref 11–51)

## 2019-09-05 MED ORDER — SODIUM CHLORIDE 0.9% FLUSH: 3.0000 mL | Freq: Once | INTRAVENOUS | Status: DC

## 2019-09-05 MED ORDER — OMEPRAZOLE 20 MG PO CPDR: 20.0000 mg | DELAYED_RELEASE_CAPSULE | Freq: Every day | ORAL | 0 refills | Status: DC

## 2019-09-05 NOTE — Discharge Instructions (Addendum)
Please start Omeprazole 20mg  daily  Make an appointment with [facility] to establish care with a primary doctor Make an appointment with

## 2019-09-05 NOTE — ED Triage Notes (Signed)
Pt c.o mid epigastric pain for the last week, bloating feeling that radiates up her chest. Pt also c.o increased vaginal bleeding recently but states she also has slight vaginal bleeding. Pt a.o, nad noted.

## 2019-09-05 NOTE — ED Provider Notes (Signed)
[facility] EMERGENCY DEPARTMENT Anna Mann Note   CSN: [id number] Arrival date & time: 09/05/19  1417     History   Chief Complaint Chief Complaint  Patient presents with  . Abdominal Pain    HPI Anna Mann is a 58 y.o. female who presents with abdominal pain. PMH significant for vertigo, obesity. She states that for the past couple of weeks she has had persistent and gradually worsening epigastric abdominal pain. It started as a dull sensation and has worsened and now feels more like severe gas pain. It occurs whether she has had something to eat or not. She feels like she has a lot of bloating. She has been taking antacids with relief at times. She denies fever, chills, chest pain, SOB, N/V/D, urinary symptoms. What brought her today is she became worried because she has had several family members who have died from colon cancer. She has never had an endoscopy or colonoscopy.  Additionally she reports having vaginal spotting. This has been ongoing for years. She reports that she is unsure if she's gone through menopause or not. She did have about 2 years without a period but then started having spotting after that. It has been gradually worsening and sometimes is vaginal bleeding. She has not seen OBGYN for this problem because she doesn't have insurance.     HPI  Past Medical History:  Diagnosis Date  . Abnormal Pap smear    colposcopy  . Heart murmur   . Vertigo     There are no active problems to display for this patient.   Past Surgical History:  Procedure Laterality Date  . COLPOSCOPY    . DILATION AND CURETTAGE OF UTERUS    . KNEE SURGERY       OB History    Gravida  3   Para  3   Term  3   Preterm      AB      Living  3     SAB      TAB      Ectopic      Multiple      Live Births               Home Medications    Prior to Admission medications   Medication Sig Start Date End Date Taking? Authorizing Jozey Janco   LORazepam (ATIVAN) 1 MG tablet Take 1 tablet (1 mg total) by mouth 3 (three) times daily as needed for anxiety. 07/15/17   Veryl Speak, MD  Multiple Vitamin (MULTIVITAMIN) tablet Take 1 tablet by mouth daily.    Kailynn Satterly, Historical, MD  promethazine (PHENERGAN) 25 MG tablet Take 1 tablet (25 mg total) by mouth every 6 (six) hours as needed for nausea or vomiting (dizziness). 08/21/15   Forde Dandy, MD    Family History Family History  Problem Relation Age of Onset  . Heart disease Paternal Grandmother   . Hypertension Paternal Grandmother   . Diabetes Maternal Grandmother   . Hypertension Maternal Grandmother   . Cancer Father        colon  . Breast cancer Mother   . Heart disease Mother   . Diabetes Mother   . Hypertension Mother   . Diabetes Sister   . Diabetes Brother   . Hodgkin's lymphoma Sister   . Heart disease Brother   . Diabetes Brother     Social History Social History   Tobacco Use  . Smoking status: Never Smoker  . Smokeless  tobacco: Never Used  Substance Use Topics  . Alcohol use: No  . Drug use: No     Allergies   Iodine   Review of Systems Review of Systems  Constitutional: Negative for appetite change, chills and fever.  Respiratory: Negative for shortness of breath.   Cardiovascular: Negative for chest pain.  Gastrointestinal: Positive for abdominal distention and abdominal pain. Negative for constipation, diarrhea, nausea and vomiting.  Genitourinary: Positive for vaginal bleeding (spotting). Negative for pelvic pain.  Neurological: Positive for dizziness (intermittent).  All other systems reviewed and are negative.    Physical Exam Updated Vital Signs BP (!) 151/95   Pulse 71   Temp 98.8 F (37.1 C) (Oral)   Resp (!) 68   LMP  (LMP Unknown) Comment: irregular  SpO2 96%   Physical Exam Vitals signs and nursing note reviewed.  Constitutional:      General: She is not in acute distress.    Appearance: She is well-developed. She is  obese. She is not ill-appearing.     Comments: Calm, cooperative. NAD  HENT:     Head: Normocephalic and atraumatic.  Eyes:     General: No scleral icterus.       Right eye: No discharge.        Left eye: No discharge.     Conjunctiva/sclera: Conjunctivae normal.     Pupils: Pupils are equal, round, and reactive to light.  Neck:     Musculoskeletal: Normal range of motion.  Cardiovascular:     Rate and Rhythm: Normal rate and regular rhythm.  Pulmonary:     Effort: Pulmonary effort is normal. No respiratory distress.     Breath sounds: Normal breath sounds.  Abdominal:     General: There is no distension.     Palpations: Abdomen is soft.     Tenderness: There is abdominal tenderness (epigastric, RUQ).  Skin:    General: Skin is warm and dry.  Neurological:     Mental Status: She is alert and oriented to person, place, and time.  Psychiatric:        Behavior: Behavior normal.      ED Treatments / Results  Labs (all labs ordered are listed, but only abnormal results are displayed) Labs Reviewed  COMPREHENSIVE METABOLIC PANEL - Abnormal; Notable for the following components:      Result Value   Glucose, Bld 163 (*)    BUN 5 (*)    All other components within normal limits  LIPASE, BLOOD  CBC  URINALYSIS, ROUTINE W REFLEX MICROSCOPIC  I-STAT BETA HCG BLOOD, ED ([facility], [facility], [facility] ONLY)    EKG None  Radiology Ct Abdomen Pelvis Wo Contrast  Result Date: 09/05/2019 CLINICAL DATA:  Epigastric pain for the last week. Bloating that radiates to chest. Increased vaginal bleeding. EXAM: CT ABDOMEN AND PELVIS WITHOUT CONTRAST TECHNIQUE: Multidetector CT imaging of the abdomen and pelvis was performed following the standard protocol without IV contrast. COMPARISON:  None. FINDINGS: Lower chest: Lung bases are clear. Hepatobiliary: No focal hepatic lesion. No biliary duct dilatation. Gallbladder is normal. Common bile duct is normal. Pancreas: Pancreas is normal. No ductal dilatation. No  pancreatic inflammation. Spleen: Normal spleen Adrenals/urinary tract: Adrenal glands and kidneys are normal. The ureters and bladder normal. Stomach/Bowel: Stomach, small bowel, appendix, and cecum are normal. The colon and rectosigmoid colon are normal. Vascular/Lymphatic: Abdominal aorta is normal caliber. No periportal or retroperitoneal adenopathy. No pelvic adenopathy. Reproductive: Uterus and adnexa are normal by CT imaging. Uterus poorly  evaluated by CT. Other: A small fat filled umbilical hernia measures 1.6 cm in greatest dimension. No inguinal hernia Musculoskeletal: No aggressive osseous lesion. IMPRESSION: 1. No acute findings in the abdomen pelvis. 2. No explanation for epigastric pain. Electronically Signed   By: Suzy Bouchard M.D.   On: 09/05/2019 19:12    Procedures Procedures (including critical care time)  Medications Ordered in ED Medications  sodium chloride flush (NS) 0.9 % injection 3 mL (3 mLs Intravenous Not Given 09/05/19 1949)     Initial Impression / Assessment and Plan / ED Course  I have reviewed the triage vital signs and the nursing notes.  Pertinent labs & imaging results that were available during my care of the patient were reviewed by me and considered in my medical decision making (see chart for details).  58 year old female presents with epigastric abdominal pain and bloating for a couple of weeks. Vitals are normal. On exam she is minimally tender in the epigastric area and RUQ. Pelvic was deferred since her spotting has been ongoing for years. Labs are reassuring. CT of abdomen/pelvis were obtained due to poor outpatient f/u, age, and family hx of colon cancer. She has reported hx of anaphylaxis to contrast so non-contrast study was obtained and was negative. Suspect she likely has gastritis/dyspepsia. She will benefit from further outpatient work up with colonoscopy and OBGYN f/u. Case management was consulted to help her establish appt with PCP. She was  given rx for Omeprazole and advised to return if worsening.  Final Clinical Impressions(s) / ED Diagnoses   Final diagnoses:  Epigastric abdominal pain  Vaginal spotting    ED Discharge Orders    None       Recardo Evangelist, PA-C 09/05/19 2026    Maudie Flakes, MD 09/05/19 2318

## 2019-09-05 NOTE — ED Notes (Signed)
Patient verbalizes understanding of discharge instructions. Opportunity for questioning and answers were provided. Armband removed by staff, pt discharged from ED via wheelchair to home.  

## 2019-09-06 ENCOUNTER — Telehealth: Payer: Self-pay | Admitting: Surgery

## 2019-09-06 NOTE — Telephone Encounter (Signed)
ED CM contacted patient via phone to assist with establishing PCP for follow-up.  Discussed [place] and services provided patient is agreeable with establishing care at the clinic. Patient provided permission to forward information to CM at the clinic, also provided patient with contact information for clinic.  No further ED CM needs identified.

## 2019-09-08 ENCOUNTER — Telehealth: Payer: Self-pay

## 2019-09-08 NOTE — Telephone Encounter (Signed)
Message received from Laurena Slimmer, RN CM requesting a hospital follow up appointment for the patient.   Call placed to patient and scheduled her for appointment at [facility] tomorrow  - 09/09/2019 @ 0930. Patient has the clinic address

## 2019-09-09 ENCOUNTER — Encounter: Payer: Self-pay | Admitting: Nurse Practitioner

## 2019-09-09 ENCOUNTER — Other Ambulatory Visit: Payer: Self-pay

## 2019-09-09 ENCOUNTER — Ambulatory Visit: Payer: Self-pay

## 2019-09-09 ENCOUNTER — Ambulatory Visit: Payer: Self-pay | Attending: Nurse Practitioner | Admitting: Nurse Practitioner

## 2019-09-09 DIAGNOSIS — R7309 Other abnormal glucose: Secondary | ICD-10-CM

## 2019-09-09 DIAGNOSIS — Z7689 Persons encountering health services in other specified circumstances: Secondary | ICD-10-CM

## 2019-09-09 DIAGNOSIS — K219 Gastro-esophageal reflux disease without esophagitis: Secondary | ICD-10-CM

## 2019-09-09 DIAGNOSIS — Z1211 Encounter for screening for malignant neoplasm of colon: Secondary | ICD-10-CM

## 2019-09-09 DIAGNOSIS — N939 Abnormal uterine and vaginal bleeding, unspecified: Secondary | ICD-10-CM

## 2019-09-09 MED ORDER — OMEPRAZOLE 20 MG PO CPDR: 20.0000 mg | DELAYED_RELEASE_CAPSULE | Freq: Two times a day (BID) | ORAL | 1 refills | Status: DC

## 2019-09-09 MED FILL — OMEPRAZOLE 20 MG CAP: 20 | 30 days supply | Qty: 60 | Fill #0

## 2019-09-09 NOTE — Progress Notes (Signed)
Virtual Visit via Telephone Note Due to national recommendations of social distancing due to [place] 19, telehealth visit is felt to be most appropriate for this patient at this time.  I discussed the limitations, risks, security and privacy concerns of performing an evaluation and management service by telephone and the availability of in person appointments. I also discussed with the patient that there may be a patient responsible charge related to this service. The patient expressed understanding and agreed to proceed.    I connected with Anna Mann on 09/09/19  at   9:30 AM EDT  EDT by telephone and verified that I am speaking with the correct person using two identifiers.   Consent I discussed the limitations, risks, security and privacy concerns of performing an evaluation and management service by telephone and the availability of in person appointments. I also discussed with the patient that there may be a patient responsible charge related to this service. The patient expressed understanding and agreed to proceed.   Location of Patient: Private Residence    Location of Provider: [place] [facility] [place] participating in Telemedicine visit: Geryl Rankins FNP-BC [place]    History of Present Illness: Telemedicine visit for: Establish care  has a past medical history of Abnormal Pap smear, Heart murmur, and Vertigo.  She has not seen a PCP in over 13 years. Her last PAP was 2018 and mammogram was 2013. She has never had a colonoscopy.   She has AUB with history of abnormal PAP requiring colposcopy.  Per her report she had been postmenopausal in her early 4s. Then she started having a menstrual cycle a few years ago. She states her bleeding is  "like a regular menstrual cycle" Has not been evaluated for this. Will need PAP and likely referral to women's clinic.  Blood pressures have been essentially normal.  BP Readings from Last  3 Encounters:  09/05/19 124/75  07/15/17 136/87  08/21/15 126/65    GERD She could not afford the prescription omeprazole so she has been taking omeprazole OTC. She continue with heartburn, bloating and pressure under her ribcage after eating.     Past Medical History:  Diagnosis Date  . Abnormal Pap smear    colposcopy  . Heart murmur   . Vertigo     Past Surgical History:  Procedure Laterality Date  . COLPOSCOPY    . DILATION AND CURETTAGE OF UTERUS    . KNEE SURGERY    . TUBAL LIGATION      Family History  Problem Relation Age of Onset  . Heart disease Paternal Grandmother   . Hypertension Paternal Grandmother   . Diabetes Maternal Grandmother   . Hypertension Maternal Grandmother   . Cancer Father        colon  . Breast cancer Mother   . Heart disease Mother   . Diabetes Mother   . Hypertension Mother   . Diabetes Sister   . Diabetes Brother   . Hodgkin's lymphoma Sister   . Heart disease Brother   . Diabetes Brother     Social History   Socioeconomic History  . Marital status: Divorced    Spouse name: Not on file  . Number of children: Not on file  . Years of education: Not on file  . Highest education level: Not on file  Occupational History  . Not on file  Social Needs  . Financial resource strain: Not on file  . Food  insecurity    Worry: Not on file    Inability: Not on file  . Transportation needs    Medical: Not on file    Non-medical: Not on file  Tobacco Use  . Smoking status: Never Smoker  . Smokeless tobacco: Never Used  Substance and Sexual Activity  . Alcohol use: No  . Drug use: No  . Sexual activity: Not Currently    Birth control/protection: None  Lifestyle  . Physical activity    Days per week: Not on file    Minutes per session: Not on file  . Stress: Not on file  Relationships  . Social Herbalist on phone: Not on file    Gets together: Not on file    Attends religious service: Not on file    Active member  of club or organization: Not on file    Attends meetings of clubs or organizations: Not on file    Relationship status: Not on file  Other Topics Concern  . Not on file  Social History Narrative  . Not on file     Observations/Objective: Awake, alert and oriented x 3   Review of Systems  Constitutional: Negative for fever, malaise/fatigue and weight loss.  HENT: Negative.  Negative for nosebleeds.   Eyes: Negative.  Negative for blurred vision, double vision and photophobia.  Respiratory: Negative.  Negative for cough and shortness of breath.   Cardiovascular: Negative.  Negative for chest pain, palpitations and leg swelling.  Gastrointestinal: Positive for heartburn. Negative for nausea and vomiting.  Genitourinary:       AUB  Musculoskeletal: Negative.  Negative for myalgias.  Neurological: Negative.  Negative for dizziness, focal weakness, seizures and headaches.  Psychiatric/Behavioral: Negative.  Negative for suicidal ideas.    Assessment and Plan: Anna Mann was seen today for hospitalization follow-up.  Diagnoses and all orders for this visit:  Encounter to establish care  Abnormal uterine bleeding (AUB) -     Ambulatory referral to Gynecology -     US PELVIC COMPLETE WITH TRANSVAGINAL; Future  Colon cancer screening -     Fecal occult blood, imunochemical([facility]); Future  Elevated glucose -     Hemoglobin A1c; Future  Gastroesophageal reflux disease, unspecified whether esophagitis present -     omeprazole (PRILOSEC) 20 MG capsule; Take 1 capsule (20 mg total) by mouth 2 (two) times daily before a meal. INSTRUCTIONS: Avoid GERD Triggers: acidic, spicy or fried foods, caffeine, coffee, sodas,  alcohol and chocolate.     Follow Up Instructions Return in about 4 weeks (around 10/07/2019).     I discussed the assessment and treatment plan with the patient. The patient was provided an opportunity to ask questions and all were answered. The patient agreed  with the plan and demonstrated an understanding of the instructions.   The patient was advised to call back or seek an in-person evaluation if the symptoms worsen or if the condition fails to improve as anticipated.  I provided 24 minutes of non-face-to-face time during this encounter including median intraservice time, reviewing previous notes, labs, imaging, medications and explaining diagnosis and management.  Gildardo Pounds, FNP-BC

## 2019-09-10 ENCOUNTER — Other Ambulatory Visit: Payer: Self-pay | Admitting: Nurse Practitioner

## 2019-09-10 LAB — HEMOGLOBIN A1C
Est. average glucose Bld gHb Est-mCnc: 197 mg/dL
Hgb A1c MFr Bld: 8.5 % — ABNORMAL HIGH (ref 4.8–5.6)

## 2019-09-10 MED ORDER — TRUE METRIX METER W/DEVICE KIT: PACK | 0 refills | Status: DC

## 2019-09-10 MED ORDER — METFORMIN HCL 500 MG PO TABS: ORAL_TABLET | ORAL | 3 refills | Status: DC

## 2019-09-10 MED ORDER — TRUEPLUS LANCETS 28G MISC
3 refills | Status: DC
Start: 2019-09-10 — End: 2019-11-22

## 2019-09-10 MED ORDER — TRUE METRIX BLOOD GLUCOSE TEST VI STRP: ORAL_STRIP | 12 refills | Status: DC

## 2019-09-11 MED FILL — metFORMIN HCL 500 MG TABS: 500 | 39 days supply | Qty: 120 | Fill #0

## 2019-09-11 MED FILL — !TRUE METRIX BLOOD GLUCOSE: 1 days supply | Qty: 1 | Fill #0

## 2019-09-11 MED FILL — TRUE METRIX TEST STRIP: 100 days supply | Qty: 100 | Fill #0

## 2019-09-11 MED FILL — TRUEplus LANCETS 28G MISC: 50 days supply | Qty: 100 | Fill #0

## 2019-09-14 ENCOUNTER — Encounter: Payer: Self-pay | Admitting: Nurse Practitioner

## 2019-09-30 ENCOUNTER — Ambulatory Visit: Payer: Self-pay | Admitting: Nurse Practitioner

## 2019-10-01 ENCOUNTER — Other Ambulatory Visit: Payer: Self-pay

## 2019-10-01 ENCOUNTER — Encounter (HOSPITAL_COMMUNITY): Payer: Self-pay | Admitting: Emergency Medicine

## 2019-10-01 ENCOUNTER — Emergency Department (HOSPITAL_COMMUNITY)
Admission: EM | Admit: 2019-10-01 | Discharge: 2019-10-01 | Disposition: A | Discharge status: Home / Self Care | Payer: Self-pay | Attending: Emergency Medicine | Admitting: Emergency Medicine

## 2019-10-01 DIAGNOSIS — R531 Weakness: Secondary | ICD-10-CM | POA: Insufficient documentation

## 2019-10-01 DIAGNOSIS — Z79899 Other long term (current) drug therapy: Secondary | ICD-10-CM | POA: Insufficient documentation

## 2019-10-01 DIAGNOSIS — Z7984 Long term (current) use of oral hypoglycemic drugs: Secondary | ICD-10-CM | POA: Insufficient documentation

## 2019-10-01 LAB — URINALYSIS, ROUTINE W REFLEX MICROSCOPIC
Bilirubin Urine: NEGATIVE
Glucose, UA: NEGATIVE mg/dL
Ketones, ur: NEGATIVE mg/dL
Leukocytes,Ua: NEGATIVE
Nitrite: NEGATIVE
Protein, ur: NEGATIVE mg/dL
Specific Gravity, Urine: 1.024 (ref 1.005–1.030)
pH: 6 (ref 5.0–8.0)

## 2019-10-01 LAB — BASIC METABOLIC PANEL
Anion gap: 9 (ref 5–15)
BUN: 10 mg/dL (ref 6–20)
CO2: 25 mmol/L (ref 22–32)
Calcium: 9.1 mg/dL (ref 8.9–10.3)
Chloride: 107 mmol/L (ref 98–111)
Creatinine, Ser: 0.69 mg/dL (ref 0.44–1.00)
GFR calc Af Amer: >60 mL/min (ref >60)
GFR calc non Af Amer: >60 mL/min (ref >60)
Glucose, Bld: 119 mg/dL — ABNORMAL HIGH (ref 70–99)
Potassium: 3.7 mmol/L (ref 3.5–5.1)
Sodium: 141 mmol/L (ref 135–145)

## 2019-10-01 LAB — TSH: TSH: 1.464 u[IU]/mL (ref 0.350–4.500)

## 2019-10-01 LAB — MAGNESIUM: Magnesium: 2 mg/dL (ref 1.7–2.4)

## 2019-10-01 LAB — CBG MONITORING, ED: Glucose-Capillary: 137 mg/dL — ABNORMAL HIGH (ref 70–99)

## 2019-10-01 LAB — TROPONIN I (HIGH SENSITIVITY)
Troponin I (High Sensitivity): 7 ng/L (ref <18)
Troponin I (High Sensitivity): 7 ng/L (ref <18)

## 2019-10-01 NOTE — ED Provider Notes (Signed)
[facility] EMERGENCY DEPARTMENT Provider Note   CSN: [id number] Arrival date & time: 10/01/19  0177     History   Chief Complaint Chief Complaint  Patient presents with  . Weakness    HPI Anna Mann is a 58 y.o. female.     HPI Patient presents with generalized weakness.  States that she felt week all over this morning when she woke up. States that she was sweaty last night.  States she has had episodes of this over the last few days to.  States she has been adjusting when she takes her evening Metformin.  States that this morning when she felt that her sugar was 104.  She states this is low for her.  However she had episodes the other nights to and while she was doing it they were also up to 170.  States she has had cramping in her arms and legs.  States she has a follow-up appointment with her PCP on Friday with today being Wednesday.  No chest pain.  No fevers or chills.  Good oral intake.  States she has been losing weight but states it is because she is been eating healthier. Past Medical History:  Diagnosis Date  . Abnormal Pap smear    colposcopy  . Heart murmur   . Vertigo     There are no active problems to display for this patient.   Past Surgical History:  Procedure Laterality Date  . COLPOSCOPY    . DILATION AND CURETTAGE OF UTERUS    . KNEE SURGERY    . TUBAL LIGATION       OB History    Gravida  3   Para  3   Term  3   Preterm      AB      Living  3     SAB      TAB      Ectopic      Multiple      Live Births               Home Medications    Prior to Admission medications   Medication Sig Start Date End Date Taking? Authorizing Provider  Blood Glucose Monitoring Suppl (TRUE METRIX METER) w/Device KIT Use as instructed. Check blood glucose level by fingerstick twice per day. 09/10/19   Gildardo Pounds, NP  glucose blood (TRUE METRIX BLOOD GLUCOSE TEST) test strip Use as instructed 09/10/19   Gildardo Pounds, NP  metFORMIN (GLUCOPHAGE) 500 MG tablet Take 1 tablet by mouth twice a day with a meal. After 2 weeks may increase to 2 tablets twice a day with meals if able to tolerate. 09/10/19   Gildardo Pounds, NP  Multiple Vitamin (MULTIVITAMIN) tablet Take 1 tablet by mouth daily.    [provider]  omeprazole (PRILOSEC) 20 MG capsule Take 1 capsule (20 mg total) by mouth 2 (two) times daily before a meal. 09/09/19 12/08/19  Gildardo Pounds, NP  Simethicone (GAS RELIEF PO) Take by mouth.    [provider]  TRUEplus Lancets 28G MISC Use as instructed. Check blood glucose level by fingerstick twice per day. 09/10/19   Gildardo Pounds, NP    Family History Family History  Problem Relation Age of Onset  . Heart disease Paternal Grandmother   . Hypertension Paternal Grandmother   . Diabetes Maternal Grandmother   . Hypertension Maternal Grandmother   . Cancer Father  colon  . Breast cancer Mother   . Heart disease Mother   . Diabetes Mother   . Hypertension Mother   . Diabetes Sister   . Diabetes Brother   . Hodgkin's lymphoma Sister   . Heart disease Brother   . Diabetes Brother     Social History Social History   Tobacco Use  . Smoking status: Never Smoker  . Smokeless tobacco: Never Used  Substance Use Topics  . Alcohol use: No  . Drug use: No     Allergies   Iodine   Review of Systems Review of Systems  Constitutional: Positive for diaphoresis. Negative for appetite change.  HENT: Negative for congestion.   Respiratory: Negative for shortness of breath.   Cardiovascular: Negative for chest pain.  Gastrointestinal: Negative for abdominal pain.  Genitourinary: Negative for flank pain.  Musculoskeletal: Negative for back pain.  Skin: Negative for rash.  Neurological: Positive for weakness. Negative for light-headedness.  Psychiatric/Behavioral: Negative for confusion.     Physical Exam Updated Vital Signs BP (!) 104/57   Pulse 64    Temp 98.1 F (36.7 C) (Oral)   Resp 16   Ht '5\' 3"'  (1.6 m)   Wt 131.5 kg   LMP  (LMP Unknown) Comment: irregular  SpO2 98%   BMI 51.37 kg/m   Physical Exam Vitals signs and nursing note reviewed.  Constitutional:      Appearance: She is not diaphoretic.  HENT:     Head: Normocephalic.  Eyes:     Extraocular Movements: Extraocular movements intact.  Cardiovascular:     Rate and Rhythm: Regular rhythm.  Chest:     Chest wall: No tenderness.  Abdominal:     Tenderness: There is no abdominal tenderness.  Musculoskeletal:     Right lower leg: No edema.     Left lower leg: No edema.  Skin:    General: Skin is warm.     Capillary Refill: Capillary refill takes less than 2 seconds.  Neurological:     Mental Status: She is alert and oriented to person, place, and time.      ED Treatments / Results  Labs (all labs ordered are listed, but only abnormal results are displayed) Labs Reviewed  BASIC METABOLIC PANEL - Abnormal; Notable for the following components:      Result Value   Glucose, Bld 119 (*)    All other components within normal limits  URINALYSIS, ROUTINE W REFLEX MICROSCOPIC - Abnormal; Notable for the following components:   APPearance HAZY (*)    Hgb urine dipstick SMALL (*)    Bacteria, UA FEW (*)    All other components within normal limits  CBG MONITORING, ED - Abnormal; Notable for the following components:   Glucose-Capillary 137 (*)    All other components within normal limits  MAGNESIUM  TSH  TROPONIN I (HIGH SENSITIVITY)  TROPONIN I (HIGH SENSITIVITY)    EKG None  Radiology No results found.  Procedures Procedures (including critical care time)  Medications Ordered in ED Medications - No data to display   Initial Impression / Assessment and Plan / ED Course  I have reviewed the triage vital signs and the nursing notes.  Pertinent labs & imaging results that were available during my care of the patient were reviewed by me and  considered in my medical decision making (see chart for details).        Patient presents with weakness.  Feels worse in the morning.  Feels that her  sugar may be off.  However checking it has been down to just above 100 and up to 170 not consistently either high or low.  Labs reassuring.  Has a follow-up appointment with her doctor in 2 days.  I think she is stable for discharge home.  Final Clinical Impressions(s) / ED Diagnoses   Final diagnoses:  Weakness    ED Discharge Orders    None       Davonna Belling, MD 10/01/19 1206

## 2019-10-01 NOTE — ED Triage Notes (Signed)
Pt has been feeling generalized weakness for past couple days, woke up today 0630 feeling weaker than previous. Pt thought CBG was low, took at home read 103. Pt ate breakfast, took metformin and still feels weak.

## 2019-10-03 ENCOUNTER — Ambulatory Visit: Payer: Self-pay | Attending: Nurse Practitioner | Admitting: Nurse Practitioner

## 2019-10-03 ENCOUNTER — Ambulatory Visit (HOSPITAL_BASED_OUTPATIENT_CLINIC_OR_DEPARTMENT_OTHER): Payer: Self-pay | Admitting: Pharmacist

## 2019-10-03 ENCOUNTER — Encounter: Payer: Self-pay | Admitting: Nurse Practitioner

## 2019-10-03 ENCOUNTER — Other Ambulatory Visit: Payer: Self-pay

## 2019-10-03 VITALS — BP 137/79 | HR 67 | Temp 98.5°F | Ht 63.0 in | Wt 263.0 lb

## 2019-10-03 DIAGNOSIS — Z23 Encounter for immunization: Secondary | ICD-10-CM

## 2019-10-03 DIAGNOSIS — Z1211 Encounter for screening for malignant neoplasm of colon: Secondary | ICD-10-CM

## 2019-10-03 DIAGNOSIS — E118 Type 2 diabetes mellitus with unspecified complications: Secondary | ICD-10-CM

## 2019-10-03 DIAGNOSIS — Z6841 Body Mass Index (BMI) 40.0 and over, adult: Secondary | ICD-10-CM

## 2019-10-03 LAB — GLUCOSE, POCT (MANUAL RESULT ENTRY): POC Glucose: 135 mg/dl — AB (ref 70–99)

## 2019-10-03 MED ORDER — GLIMEPIRIDE 2 MG PO TABS: 2.0000 mg | ORAL_TABLET | Freq: Every day | ORAL | 2 refills | Status: DC

## 2019-10-03 MED ORDER — PRAVASTATIN SODIUM 20 MG PO TABS: 20.0000 mg | ORAL_TABLET | Freq: Every day | ORAL | 3 refills | Status: DC

## 2019-10-03 MED ORDER — TRUE METRIX BLOOD GLUCOSE TEST VI STRP: ORAL_STRIP | 12 refills | Status: DC

## 2019-10-03 MED FILL — TRUE METRIX TEST STRIP: 25 days supply | Qty: 100 | Fill #0

## 2019-10-03 MED FILL — PRAVASTATIN SODIUM 20 MG TA: 20 | 30 days supply | Qty: 30 | Fill #0

## 2019-10-03 MED FILL — GLIMEPIRIDE 2 MG TABS: 2 | 30 days supply | Qty: 60 | Fill #0

## 2019-10-03 NOTE — Progress Notes (Signed)
Patient presents for vaccination against s. pneumo per orders of Anna Mann. Consent given. Counseling provided. No contraindications exists. Vaccine administered without incident.

## 2019-10-03 NOTE — Addendum Note (Signed)
Addended byMariane Baumgarten on: 10/03/2019 09:43 AM   Modules accepted: Orders

## 2019-10-03 NOTE — Progress Notes (Signed)
Assessment & Plan:  Anna Mann was seen today for follow-up.  Diagnoses and all orders for this visit:  Controlled type 2 diabetes mellitus with complication, without long-term current use of insulin (HCC) -     Glucose (CBG) -     glimepiride (AMARYL) 2 MG tablet; Take 1 tablet (2 mg total) by mouth daily before breakfast. May increase to 2 tablets with breakfast if fasting blood sugars >30. -     glucose blood (TRUE METRIX BLOOD GLUCOSE TEST) test strip; Use as instructed -     Ambulatory referral to Ophthalmology -     pravastatin (PRAVACHOL) 20 MG tablet; Take 1 tablet (20 mg total) by mouth daily. Continue blood sugar control as discussed in office today, low carbohydrate diet, and regular physical exercise as tolerated, 150 minutes per week (30 min each day, 5 days per week, or 50 min 3 days per week). Keep blood sugar logs with fasting goal of 90-130 mg/dl, post prandial (after you eat) less than 180.  For Hypoglycemia: BS <60 and Hyperglycemia BS >400; contact the clinic ASAP. Annual eye exams and foot exams are recommended.   Colon cancer screening -     Fecal occult blood, imunochemical([facility])  Morbid obesity with BMI of 45.0-49.9, adult (HCC) Discussed diet and exercise for person with BMI >46. Instructed: You must burn more calories than you eat. Losing 5 percent of your body weight should be considered a success. In the longer term, losing more than 15 percent of your body weight and staying at this weight is an extremely good result. However, keep in mind that even losing 5 percent of your body weight leads to important health benefits, so try not to get discouraged if you're not able to lose more than this. Will recheck weight in 3-6 months.   Patient has been counseled on age-appropriate routine health concerns for screening and prevention. These are reviewed and up-to-date. Referrals have been placed accordingly. Immunizations are up-to-date or declined.     Subjective:   Chief Complaint  Patient presents with  . Follow-up    Pt. is here for a follow up.    HPI Anna Mann 58 y.o. female presents to office today for follow up. Her last visit was tele with me to establish care so I had her come into the office today for meter check.   She was referred to the breast clinic today for mammogram.  States she stopped taking metformin a few days ago due to weakness and feeling faint. Will try her on glimepiride 59m instead.   DM TYPE 2 Not well controlled however she is making dietary changes and has started to try to transition to plant based. Glucometer averages as follows: 7 day 143; 14 day 143; 30 day 140. She endorses hypoglycemic symptoms of weakness and feeling as if she is going to pass out when her blood sugars are in the low 100s. Blood pressure is well controlled today. Denies chest pain, shortness of breath, palpitations, lightheadedness, dizziness, headaches or BLE edema. Will obtain fasting lipid panel at next office visit.  Lab Results  Component Value Date   HGBA1C 8.5 (H) 09/09/2019   BP Readings from Last 3 Encounters:  10/03/19 137/79  10/01/19 (!) 104/57  09/05/19 124/75   GERD Symptoms well controlled with Omeprazole 20 mg BID.   She denies abdominal bloating, belching, bilious reflux, chest pain, choking on food, deep pressure at base of neck, hematemesis, melena, midespigastric pain and regurgitation of undigested food.  Review of Systems  Constitutional: Negative for fever, malaise/fatigue and weight loss.  HENT: Negative.  Negative for nosebleeds.   Eyes: Negative.  Negative for blurred vision, double vision and photophobia.  Respiratory: Negative.  Negative for cough and shortness of breath.   Cardiovascular: Negative.  Negative for chest pain, palpitations and leg swelling.  Gastrointestinal: Positive for heartburn. Negative for nausea and vomiting.  Musculoskeletal: Negative.  Negative for myalgias.   Neurological: Negative.  Negative for dizziness, focal weakness, seizures and headaches.  Psychiatric/Behavioral: Negative.  Negative for suicidal ideas.    Past Medical History:  Diagnosis Date  . Abnormal Pap smear    colposcopy  . Heart murmur   . Vertigo     Past Surgical History:  Procedure Laterality Date  . COLPOSCOPY    . DILATION AND CURETTAGE OF UTERUS    . KNEE SURGERY    . TUBAL LIGATION      Family History  Problem Relation Age of Onset  . Heart disease Paternal Grandmother   . Hypertension Paternal Grandmother   . Diabetes Maternal Grandmother   . Hypertension Maternal Grandmother   . Cancer Father        colon  . Breast cancer Mother   . Heart disease Mother   . Diabetes Mother   . Hypertension Mother   . Diabetes Sister   . Diabetes Brother   . Hodgkin's lymphoma Sister   . Heart disease Brother   . Diabetes Brother     Social History Reviewed with no changes to be made today.   Outpatient Medications Prior to Visit  Medication Sig Dispense Refill  . Blood Glucose Monitoring Suppl (TRUE METRIX METER) w/Device KIT Use as instructed. Check blood glucose level by fingerstick twice per day. 1 kit 0  . Multiple Vitamin (MULTIVITAMIN) tablet Take 1 tablet by mouth daily.    Marland Kitchen omeprazole (PRILOSEC) 20 MG capsule Take 1 capsule (20 mg total) by mouth 2 (two) times daily before a meal. 180 capsule 1  . Simethicone (GAS RELIEF PO) Take by mouth.    . TRUEplus Lancets 28G MISC Use as instructed. Check blood glucose level by fingerstick twice per day. 100 each 3  . glucose blood (TRUE METRIX BLOOD GLUCOSE TEST) test strip Use as instructed 100 each 12  . metFORMIN (GLUCOPHAGE) 500 MG tablet Take 1 tablet by mouth twice a day with a meal. After 2 weeks may increase to 2 tablets twice a day with meals if able to tolerate. 180 tablet 3   No facility-administered medications prior to visit.     Allergies  Allergen Reactions  . Iodine   . Shellfish Allergy         Objective:    BP 137/79 (BP Location: Left Arm, Patient Position: Sitting, Cuff Size: Large)   Pulse 67   Temp 98.5 F (36.9 C) (Oral)   Ht '5\' 3"'  (1.6 m)   Wt 263 lb (119.3 kg)   LMP  (LMP Unknown) Comment: irregular  SpO2 95%   BMI 46.59 kg/m  Wt Readings from Last 3 Encounters:  10/03/19 263 lb (119.3 kg)  10/01/19 290 lb (131.5 kg)  07/15/17 290 lb (131.5 kg)    Physical Exam Vitals signs and nursing note reviewed.  Constitutional:      Appearance: She is well-developed.  HENT:     Head: Normocephalic and atraumatic.  Neck:     Musculoskeletal: Normal range of motion.  Cardiovascular:     Rate and Rhythm: Normal rate  and regular rhythm.  Pulmonary:     Effort: Pulmonary effort is normal. No tachypnea or respiratory distress.     Breath sounds: Normal breath sounds. No decreased breath sounds, wheezing, rhonchi or rales.  Chest:     Chest wall: No tenderness.  Abdominal:     General: Bowel sounds are normal.     Palpations: Abdomen is soft.  Musculoskeletal: Normal range of motion.  Skin:    General: Skin is warm and dry.  Neurological:     Mental Status: She is alert and oriented to person, place, and time.     Coordination: Coordination normal.  Psychiatric:        Behavior: Behavior normal. Behavior is cooperative.        Thought Content: Thought content normal.        Judgment: Judgment normal.        Patient has been counseled extensively about nutrition and exercise as well as the importance of adherence with medications and regular follow-up. The patient was given clear instructions to go to ER or return to medical center if symptoms don't improve, worsen or new problems develop. The patient verbalized understanding.   Follow-up: Return in about 2 months (around 12/09/2019).   Gildardo Pounds, FNP-BC [facility], [place]   10/03/2019, 1:43 PM

## 2019-10-06 ENCOUNTER — Other Ambulatory Visit: Payer: Self-pay

## 2019-10-06 DIAGNOSIS — Z20822 Contact with and (suspected) exposure to covid-19: Secondary | ICD-10-CM

## 2019-10-07 LAB — NOVEL CORONAVIRUS, NAA: SARS-CoV-2, NAA: NOT DETECTED

## 2019-10-17 ENCOUNTER — Other Ambulatory Visit: Payer: Self-pay

## 2019-10-17 ENCOUNTER — Encounter: Payer: Self-pay | Admitting: Obstetrics & Gynecology

## 2019-10-17 ENCOUNTER — Ambulatory Visit (INDEPENDENT_AMBULATORY_CARE_PROVIDER_SITE_OTHER): Payer: Self-pay | Admitting: Obstetrics & Gynecology

## 2019-10-17 VITALS — BP 139/74 | HR 74 | Wt 265.2 lb

## 2019-10-17 DIAGNOSIS — N950 Postmenopausal bleeding: Secondary | ICD-10-CM

## 2019-10-17 NOTE — Progress Notes (Signed)
   Subjective:    Patient ID: Anna Mann, female    DOB: 1961-05-04, 58 y.o.   MRN: [id number]  HPI 58 yo divorced P3 (32, 21, and 69 yo kids) here today with the issue of PMB. She had no periods for at least 2 years but then her periods started again in 2013 and they haven't stopped. She has bled daily since 2013.   She has been abstinent since 2007.   Review of Systems     Objective:   Physical Exam Breathing, conversing, and ambulating normally Well nourished, well hydrated Black female, no apparent distress Abd- benign,obese    Assessment & Plan:  PMB- she is unable to stay today for an [facility] She will get an [place] asap and come back for [facility] after u/s

## 2019-10-24 ENCOUNTER — Other Ambulatory Visit: Payer: Self-pay

## 2019-10-24 ENCOUNTER — Ambulatory Visit (HOSPITAL_COMMUNITY)
Admission: RE | Admit: 2019-10-24 | Discharge: 2019-10-24 | Disposition: A | Discharge status: Home / Self Care | Payer: Self-pay | Source: Ambulatory Visit | Attending: Nurse Practitioner | Admitting: Nurse Practitioner

## 2019-10-24 DIAGNOSIS — N939 Abnormal uterine and vaginal bleeding, unspecified: Secondary | ICD-10-CM

## 2019-10-24 MED FILL — OMEPRAZOLE 20 MG CAP: 20 | 30 days supply | Qty: 60 | Fill #1

## 2019-10-31 LAB — FECAL OCCULT BLOOD, IMMUNOCHEMICAL

## 2019-11-19 ENCOUNTER — Other Ambulatory Visit: Payer: Self-pay

## 2019-11-19 ENCOUNTER — Ambulatory Visit (INDEPENDENT_AMBULATORY_CARE_PROVIDER_SITE_OTHER): Payer: Self-pay | Admitting: Obstetrics & Gynecology

## 2019-11-19 ENCOUNTER — Encounter: Payer: Self-pay | Admitting: Obstetrics & Gynecology

## 2019-11-19 ENCOUNTER — Other Ambulatory Visit (HOSPITAL_COMMUNITY)
Admission: RE | Admit: 2019-11-19 | Discharge: 2019-11-19 | Disposition: A | Discharge status: Home / Self Care | Payer: Self-pay | Source: Ambulatory Visit | Attending: Obstetrics & Gynecology | Admitting: Obstetrics & Gynecology

## 2019-11-19 VITALS — BP 156/76 | HR 68 | Wt 270.6 lb

## 2019-11-19 DIAGNOSIS — N840 Polyp of corpus uteri: Secondary | ICD-10-CM

## 2019-11-19 DIAGNOSIS — N950 Postmenopausal bleeding: Secondary | ICD-10-CM

## 2019-11-19 NOTE — Progress Notes (Signed)
   Subjective:    Patient ID: Anna Mann, female    DOB: 21-Jul-1961, 58 y.o.   MRN: [id number]  HPI 58 yo divorced P3 here today for a EMBX. She has had some PMB. Her u/s showed a 2.7 cm endometrium.   Review of Systems She had a normal pap smear in 2018.    Objective:   Physical Exam Breathing, conversing, and ambulating normally Well nourished, well hydrated Black female, no apparent distress Abd- benign, obese  UPT negative, consent signed, time out done Cervix prepped with betadine and grasped with a single tooth tenaculum Uterus sounded to 12 cm Pipelle used for 2 passes with a moderate amount of tissue obtained. She tolerated the procedure well.  Moderate decensus Excellent pubic arch for vaginal surgery Difficult bimanual exam due to body habitus     Assessment & Plan:  pmb with thickened endometrium- await EMBX She will schedule a follow up visit with Dr. Rip Harbour in 2 weeks

## 2019-11-20 LAB — SURGICAL PATHOLOGY

## 2019-11-21 ENCOUNTER — Other Ambulatory Visit: Payer: Self-pay

## 2019-11-21 ENCOUNTER — Ambulatory Visit: Payer: Self-pay | Attending: Nurse Practitioner | Admitting: Nurse Practitioner

## 2019-11-21 ENCOUNTER — Encounter: Payer: Self-pay | Admitting: Nurse Practitioner

## 2019-11-21 DIAGNOSIS — Z130 Encounter for screening for diseases of the blood and blood-forming organs and certain disorders involving the immune mechanism: Secondary | ICD-10-CM

## 2019-11-21 DIAGNOSIS — E1165 Type 2 diabetes mellitus with hyperglycemia: Secondary | ICD-10-CM

## 2019-11-21 DIAGNOSIS — Z1159 Encounter for screening for other viral diseases: Secondary | ICD-10-CM

## 2019-11-21 DIAGNOSIS — Z13220 Encounter for screening for lipoid disorders: Secondary | ICD-10-CM

## 2019-11-21 DIAGNOSIS — Z13228 Encounter for screening for other metabolic disorders: Secondary | ICD-10-CM

## 2019-11-21 DIAGNOSIS — E785 Hyperlipidemia, unspecified: Secondary | ICD-10-CM

## 2019-11-21 DIAGNOSIS — Z114 Encounter for screening for human immunodeficiency virus [HIV]: Secondary | ICD-10-CM

## 2019-11-21 MED ORDER — TRUE METRIX BLOOD GLUCOSE TEST VI STRP: ORAL_STRIP | 12 refills | Status: DC

## 2019-11-21 MED ORDER — PRAVASTATIN SODIUM 20 MG PO TABS: 20.0000 mg | ORAL_TABLET | Freq: Every day | ORAL | 3 refills | Status: DC

## 2019-11-21 MED ORDER — GLIMEPIRIDE 2 MG PO TABS: 2.0000 mg | ORAL_TABLET | Freq: Every day | ORAL | 1 refills | Status: DC

## 2019-11-21 NOTE — Progress Notes (Addendum)
Virtual Visit via Telephone Note Due to national recommendations of social distancing due to [place] 19, telehealth visit is felt to be most appropriate for this patient at this time.  I discussed the limitations, risks, security and privacy concerns of performing an evaluation and management service by telephone and the availability of in person appointments. I also discussed with the patient that there may be a patient responsible charge related to this service. The patient expressed understanding and agreed to proceed.    I connected with Anna Mann on 11/21/19  at   8:30 AM EST  EDT by telephone and verified that I am speaking with the correct person using two identifiers.   Consent I discussed the limitations, risks, security and privacy concerns of performing an evaluation and management service by telephone and the availability of in person appointments. I also discussed with the patient that there may be a patient responsible charge related to this service. The patient expressed understanding and agreed to proceed.   Location of Patient: Private Residence   Location of Provider: [place] [facility] [place] participating in Telemedicine visit: Geryl Rankins FNP-BC [place]    History of Present Illness: Telemedicine visit for: Follow UP  has a past medical history of Abnormal Pap smear, Diabetes mellitus without complication ([place]), Heart murmur, and Vertigo.    Doing well overall despite the fact that she was recently laid off.  She is in the process of having her mammogram scheduled through the breast clinic scholarship program.   DM TYPE 2 Doing well today. Glucometer Readings taken mostly postprandial with averages:130-150s. A few fasting reading averages: 100s.  She does endorse symptomatic hypoglycemia when blood glucose readings are in the 100s.  I have instructed her that although she is symptomatic with these readings,  they are normal blood sugars and her carb intake during these times should not increase as this will increase her blood sugars as well.  She is overdue for eye exam and referral has been placed today. Current medications include: Amaryl 63m daily.  Lab Results  Component Value Date   HGBA1C 8.5 (H) 09/09/2019   Hyperlipidemia Patient presents for follow up to hyperlipidemia.  She is medication compliant taking pravastatin 20 mg daily as prescribed. She is not consistently diet adherent. No formal exercise routine.  Denies statin intolerance including myalgias. BMI 47.     Past Medical History:  Diagnosis Date  . Abnormal Pap smear    colposcopy  . Diabetes mellitus without complication ([place]   . Heart murmur   . Vertigo     Past Surgical History:  Procedure Laterality Date  . COLPOSCOPY    . DILATION AND CURETTAGE OF UTERUS    . KNEE SURGERY    . TUBAL LIGATION      Family History  Problem Relation Age of Onset  . Heart disease Paternal Grandmother   . Hypertension Paternal Grandmother   . Diabetes Maternal Grandmother   . Hypertension Maternal Grandmother   . Cancer Father        colon  . Breast cancer Mother   . Heart disease Mother   . Diabetes Mother   . Hypertension Mother   . Diabetes Sister   . Diabetes Brother   . Hodgkin's lymphoma Sister   . Heart disease Brother   . Diabetes Brother     Social History   Socioeconomic History  . Marital status: Divorced    Spouse name: Not  on file  . Number of children: Not on file  . Years of education: Not on file  . Highest education level: Not on file  Occupational History  . Not on file  Tobacco Use  . Smoking status: Never Smoker  . Smokeless tobacco: Never Used  Substance and Sexual Activity  . Alcohol use: No  . Drug use: No  . Sexual activity: Not Currently    Birth control/protection: None  Other Topics Concern  . Not on file  Social History Narrative  . Not on file   Social Determinants of Health    Financial Resource Strain:   . Difficulty of Paying Living Expenses: Not on file  Food Insecurity:   . Worried About Charity fundraiser in the Last Year: Not on file  . Ran Out of Food in the Last Year: Not on file  Transportation Needs:   . Lack of Transportation (Medical): Not on file  . Lack of Transportation (Non-Medical): Not on file  Physical Activity:   . Days of Exercise per Week: Not on file  . Minutes of Exercise per Session: Not on file  Stress:   . Feeling of Stress : Not on file  Social Connections:   . Frequency of Communication with Friends and Family: Not on file  . Frequency of Social Gatherings with Friends and Family: Not on file  . Attends Religious Services: Not on file  . Active Member of Clubs or Organizations: Not on file  . Attends Archivist Meetings: Not on file  . Marital Status: Not on file   This note is  being shared with the patient   Observations/Objective: Awake, alert and oriented x 3   Review of Systems  Constitutional: Negative for fever, malaise/fatigue and weight loss.  HENT: Negative.  Negative for nosebleeds.   Eyes: Negative.  Negative for blurred vision, double vision and photophobia.  Respiratory: Negative.  Negative for cough and shortness of breath.   Cardiovascular: Negative.  Negative for chest pain, palpitations and leg swelling.  Gastrointestinal: Positive for heartburn. Negative for nausea and vomiting.  Musculoskeletal: Negative.  Negative for myalgias.  Neurological: Negative.  Negative for dizziness, focal weakness, seizures and headaches.  Psychiatric/Behavioral: Negative.  Negative for suicidal ideas.    Assessment and Plan: Anna Mann was seen today for diabetes and hyperlipidemia.  Diagnoses and all orders for this visit:  Controlled type 2 diabetes mellitus with hyperglycemia, without long-term current use of insulin (HCC) -     glimepiride (AMARYL) 2 MG tablet; Take 1 tablet (2 mg total) by mouth  daily before breakfast. E11.65 -     pravastatin (PRAVACHOL) 20 MG tablet; Take 1 tablet (20 mg total) by mouth daily. -     glucose blood (TRUE METRIX BLOOD GLUCOSE TEST) test strip; Use as instructed. Check blood glucose level by fingerstick twice per day.  E11.65 -     A1c; Future -     urine micro; Future -     Lipid Panel; Future -     Ambulatory referral to Ophthalmology Continue blood sugar control as discussed in office today, low carbohydrate diet, and regular physical exercise as tolerated, 150 minutes per week (30 min each day, 5 days per week, or 50 min 3 days per week). Keep blood sugar logs with fasting goal of 90-130 mg/dl, post prandial (after you eat) less than 180.  For Hypoglycemia: BS <60 and Hyperglycemia BS >400; contact the clinic ASAP. Annual eye exams and foot exams  are recommended.   Screening for deficiency anemia -     CBC; Future  Encounter for hepatitis C screening test for low risk patient -     Hepatitis C Antibody; Future  Screening for metabolic disorder -     SLP53+YYFR; Future  Encounter for screening for HIV -     HIV antibody (with reflex); Future  Lipid screening -     Lipid Panel; Future     Follow Up Instructions Return in about 3 months (around 02/19/2020) for DM. HPL.     I discussed the assessment and treatment plan with the patient. The patient was provided an opportunity to ask questions and all were answered. The patient agreed with the plan and demonstrated an understanding of the instructions.   The patient was advised to call back or seek an in-person evaluation if the symptoms worsen or if the condition fails to improve as anticipated.  I provided 18 minutes of non-face-to-face time during this encounter including median intraservice time, reviewing previous notes, labs, imaging, medications and explaining diagnosis and management.  Gildardo Pounds, FNP-BC

## 2019-11-21 NOTE — Progress Notes (Deleted)
DM/Chol follow up.  States that fasting readings have been in the 100s. Afternoon readings have been in the 130s-150s.

## 2019-11-22 ENCOUNTER — Other Ambulatory Visit: Payer: Self-pay | Admitting: Nurse Practitioner

## 2019-11-22 DIAGNOSIS — E1165 Type 2 diabetes mellitus with hyperglycemia: Secondary | ICD-10-CM

## 2019-11-22 DIAGNOSIS — K219 Gastro-esophageal reflux disease without esophagitis: Secondary | ICD-10-CM

## 2019-11-22 MED ORDER — PRAVASTATIN SODIUM 20 MG PO TABS: 20.0000 mg | ORAL_TABLET | Freq: Every day | ORAL | 3 refills | Status: DC

## 2019-11-22 MED ORDER — TRUE METRIX BLOOD GLUCOSE TEST VI STRP: ORAL_STRIP | 12 refills | Status: AC

## 2019-11-22 MED ORDER — TRUEPLUS LANCETS 28G MISC: 3 refills | Status: AC

## 2019-11-22 MED ORDER — OMEPRAZOLE 20 MG PO CPDR: 20.0000 mg | DELAYED_RELEASE_CAPSULE | Freq: Two times a day (BID) | ORAL | 1 refills | Status: DC

## 2019-11-22 MED ORDER — GLIMEPIRIDE 2 MG PO TABS: 2.0000 mg | ORAL_TABLET | Freq: Every day | ORAL | 1 refills | Status: DC

## 2019-11-24 MED FILL — GLIMEPIRIDE 2 MG TABS: 2 | 30 days supply | Qty: 30 | Fill #0

## 2019-11-24 MED FILL — TRUEplus LANCETS 28G MISC: 50 days supply | Qty: 100 | Fill #0

## 2019-11-24 MED FILL — OMEPRAZOLE 20 MG CAP: 20 | 30 days supply | Qty: 60 | Fill #0

## 2019-11-24 MED FILL — TRUE METRIX TEST STRIP: 50 days supply | Qty: 100 | Fill #0

## 2019-11-24 MED FILL — PRAVASTATIN SODIUM 20 MG TA: 20 | 30 days supply | Qty: 30 | Fill #0

## 2019-11-27 ENCOUNTER — Encounter: Payer: Self-pay | Admitting: *Deleted

## 2019-12-18 ENCOUNTER — Encounter: Payer: Self-pay | Admitting: Obstetrics and Gynecology

## 2019-12-18 ENCOUNTER — Other Ambulatory Visit: Payer: Self-pay

## 2019-12-18 ENCOUNTER — Ambulatory Visit (INDEPENDENT_AMBULATORY_CARE_PROVIDER_SITE_OTHER): Payer: Self-pay | Admitting: Obstetrics and Gynecology

## 2019-12-18 DIAGNOSIS — D219 Benign neoplasm of connective and other soft tissue, unspecified: Secondary | ICD-10-CM

## 2019-12-18 DIAGNOSIS — N950 Postmenopausal bleeding: Secondary | ICD-10-CM

## 2019-12-18 MED ORDER — MEGESTROL ACETATE 40 MG PO TABS: 20.0000 mg | ORAL_TABLET | Freq: Every day | ORAL | 5 refills | Status: DC

## 2019-12-18 MED FILL — MEGESTROL 40 MG TABLET: 40 | 15 days supply | Qty: 60 | Fill #0

## 2019-12-18 NOTE — Progress Notes (Signed)
Patient ID: Anna Mann, female   DOB: 1961/08/30, 59 y.o.   MRN: [id number]    VIRTUAL GYNECOLOGY VISIT ENCOUNTER NOTE  I connected with Anna Mann on 12/18/19 at  9:15 AM EST by my chart at home and verified that I am speaking with the correct person using two identifiers.   I discussed the limitations, risks, security and privacy concerns of performing an evaluation and management service by telephone and the availability of in person appointments. I also discussed with the patient that there may be a patient responsible charge related to this service. The patient expressed understanding and agreed to proceed.   History:  Anna Mann is a 59 y.o. G35P3003 female being evaluated today for PMB. She saw Dr Hulan Fray in November for PMB. She underwent an [facility] in December. She had a 3 day cycle in December. She has some spotting as well. She had an U/S which demonstrated uterine fibroids.         Past Medical History:  Diagnosis Date  . Abnormal Pap smear    colposcopy  . Diabetes mellitus without complication ([place])   . Heart murmur   . Vertigo    Past Surgical History:  Procedure Laterality Date  . COLPOSCOPY    . DILATION AND CURETTAGE OF UTERUS    . KNEE SURGERY    . TUBAL LIGATION     The following portions of the patient's history were reviewed and updated as appropriate: allergies, current medications, past family history, past medical history, past social history, past surgical history and problem list.   Health Maintenance:  Normal pap and negative HRHPV on 2018.    Review of Systems:  Pertinent items noted in HPI and remainder of comprehensive ROS otherwise negative.  Physical Exam:   General:  Alert, oriented and cooperative.   Mental Status: Normal mood and affect perceived. Normal judgment and thought content.  Physical exam deferred due to nature of the encounter  Labs and Imaging No results found for this or any previous visit (from the past 336 hour(s)). No  results found.    Assessment and Plan:     1. Postmenopausal bleeding Stable EMBX results reviewed with pt. Tx options reviewed with pt. She desires to try medication first. Will start Megace 20 mg qd. U/R/B reviewed with pt  2. Fibroids Stable     F/U face to face in 2 months.   I discussed the assessment and treatment plan with the patient. The patient was provided an opportunity to ask questions and all were answered. The patient agreed with the plan and demonstrated an understanding of the instructions.   The patient was advised to call back or seek an in-person evaluation/go to the ED if the symptoms worsen or if the condition fails to improve as anticipated.  I provided 10 minutes of non-face-to-face time during this encounter.   Chancy Milroy, MD [facility], [place]

## 2020-01-02 MED FILL — GLIMEPIRIDE 2 MG TABS: 2 | 90 days supply | Qty: 90 | Fill #1

## 2020-01-02 MED FILL — PRAVASTATIN SODIUM 20 MG TA: 20 | 90 days supply | Qty: 90 | Fill #1

## 2020-01-19 ENCOUNTER — Telehealth: Payer: Self-pay | Admitting: Nurse Practitioner

## 2020-01-21 ENCOUNTER — Ambulatory Visit: Payer: BLUE CROSS/BLUE SHIELD | Attending: Internal Medicine

## 2020-01-21 ENCOUNTER — Other Ambulatory Visit: Payer: Self-pay

## 2020-01-21 ENCOUNTER — Ambulatory Visit: Payer: BLUE CROSS/BLUE SHIELD | Attending: Nurse Practitioner | Admitting: Physician Assistant

## 2020-01-21 DIAGNOSIS — E1165 Type 2 diabetes mellitus with hyperglycemia: Secondary | ICD-10-CM

## 2020-01-21 DIAGNOSIS — Z20822 Contact with and (suspected) exposure to covid-19: Secondary | ICD-10-CM

## 2020-01-21 NOTE — Progress Notes (Signed)
Patient ID: Anna Mann, female   DOB: 03-Nov-1961, 59 y.o.   MRN: [id number] Virtual Visit via Telephone Note  I connected with Margaretta Cottrill on 01/21/20 at  9:30 AM EST by telephone and verified that I am speaking with the correct person using two identifiers.   I discussed the limitations, risks, security and privacy concerns of performing an evaluation and management service by telephone and the availability of in person appointments. I also discussed with the patient that there may be a patient responsible charge related to this service. The patient expressed understanding and agreed to proceed.  PATIENT visit by telephone virtually in the context of Covid-19 pandemic. Patient location: My Location:  [place] office Persons on the call:   History of Present Illness: Diarrhea 5 days ago for 1 day.  HA.  Feeling hot then cold then sweating.  +body aches. No N/V.  Minimal cough.  +runny nose.  No SOB.  She is out of work.   No respiratory distress.  No energy.  Work is not understanding of her being out.  She wants to go for covid testing.  Blood sugars running 104-130   Observations/Objective:  A&Ox3   Assessment and Plan: 1. Suspected 2019 novel coronavirus infection Gave instructions for testing.  Note for work for this week. Follow CDC guidelines for quarantine.  To ED if worsens/develops breathing issues or unable to stay hydrated.    2. Controlled type 2 diabetes mellitus with hyperglycemia, without long-term current use of insulin (HCC) Continue current regimen    Follow Up Instructions: See PCP in 1 months.    I discussed the assessment and treatment plan with the patient. The patient was provided an opportunity to ask questions and all were answered. The patient agreed with the plan and demonstrated an understanding of the instructions.   The patient was advised to call back or seek an in-person evaluation if the symptoms worsen or if the condition fails to improve as  anticipated.  I provided 12 minutes of non-face-to-face time during this encounter.   Freeman Caldron, PA-C

## 2020-01-21 NOTE — Progress Notes (Signed)
Televisit Name and DOB verified  Having COVID symptoms/ Requested to be tested C /o Chills , headache, muscle aches, feeling jittery, stomach discomfort pain 6/10 at this time. Denies taking any OTC meds CBG 104 last night as per pt statement  Needs refills on Prilosec

## 2020-01-22 LAB — NOVEL CORONAVIRUS, NAA: SARS-CoV-2, NAA: NOT DETECTED

## 2020-01-23 ENCOUNTER — Encounter: Payer: Self-pay | Admitting: Nurse Practitioner

## 2020-01-25 ENCOUNTER — Other Ambulatory Visit: Payer: Self-pay | Admitting: Nurse Practitioner

## 2020-01-25 MED ORDER — FLUTICASONE PROPIONATE 50 MCG/ACT NA SUSP: 1.0000 | Freq: Every day | NASAL | 1 refills | Status: DC

## 2020-01-26 MED FILL — FLUTICASONE PROP 50 MCG SPR: 50 | 30 days supply | Qty: 16 | Fill #0

## 2020-02-12 ENCOUNTER — Encounter: Payer: Self-pay | Admitting: Nurse Practitioner

## 2020-02-12 ENCOUNTER — Ambulatory Visit: Payer: BLUE CROSS/BLUE SHIELD | Attending: Nurse Practitioner | Admitting: Nurse Practitioner

## 2020-02-12 ENCOUNTER — Other Ambulatory Visit: Payer: Self-pay

## 2020-02-12 DIAGNOSIS — Z7984 Long term (current) use of oral hypoglycemic drugs: Secondary | ICD-10-CM | POA: Diagnosis not present

## 2020-02-12 DIAGNOSIS — Z1231 Encounter for screening mammogram for malignant neoplasm of breast: Secondary | ICD-10-CM

## 2020-02-12 DIAGNOSIS — E1165 Type 2 diabetes mellitus with hyperglycemia: Secondary | ICD-10-CM | POA: Diagnosis not present

## 2020-02-12 DIAGNOSIS — E785 Hyperlipidemia, unspecified: Secondary | ICD-10-CM | POA: Diagnosis not present

## 2020-02-12 DIAGNOSIS — K219 Gastro-esophageal reflux disease without esophagitis: Secondary | ICD-10-CM

## 2020-02-12 MED ORDER — PAROXETINE HCL 10 MG PO TABS: 10.0000 mg | ORAL_TABLET | Freq: Every day | ORAL | 1 refills | Status: DC

## 2020-02-12 MED ORDER — OMEPRAZOLE 20 MG PO CPDR: 20.0000 mg | DELAYED_RELEASE_CAPSULE | Freq: Two times a day (BID) | ORAL | 1 refills | Status: DC

## 2020-02-12 MED FILL — PAROXETINE HCL 10 MG TABS: 10 | 30 days supply | Qty: 30 | Fill #0

## 2020-02-12 MED FILL — OMEPRAZOLE 20 MG CAP: 20 | 90 days supply | Qty: 180 | Fill #0

## 2020-02-12 NOTE — Progress Notes (Signed)
Virtual Visit via Telephone Note Due to national recommendations of social distancing due to [place] 19, telehealth visit is felt to be most appropriate for this patient at this time.  I discussed the limitations, risks, security and privacy concerns of performing an evaluation and management service by telephone and the availability of in person appointments. I also discussed with the patient that there may be a patient responsible charge related to this service. The patient expressed understanding and agreed to proceed.    I connected with Anna Mann on 02/12/20  at   9:10 AM EST  EDT by telephone and verified that I am speaking with the correct person using two identifiers.   Consent I discussed the limitations, risks, security and privacy concerns of performing an evaluation and management service by telephone and the availability of in person appointments. I also discussed with the patient that there may be a patient responsible charge related to this service. The patient expressed understanding and agreed to proceed.   Location of Patient: Private Residence   Location of Provider: [place] and [place] participating in Telemedicine visit: Geryl Rankins FNP-BC [place]    History of Present Illness: Telemedicine visit for: F/U  DM TYPE 2 Well controlled. Down from 8.5 to 6.2  Denies any symptoms of hypoglycemia.  Just had a new grandson in January so has not been frequently monitoring her blood glucose levels at home. Endorses medication compliance taking glimepiride 2 mg daily. Referral for ophthalmology placed today.   Lab Results  Component Value Date   HGBA1C 6.2 (H) 02/16/2020    Dyslipidemia LDL nearing goal of <70. She is taking pravastatin 20 mg daily as prescribed. Does not endorse any statin intolerance or myalgias.  Lab Results  Component Value Date   [place] 73 02/16/2020    Past Medical History:  Diagnosis  Date  . Abnormal Pap smear    colposcopy  . Diabetes mellitus without complication ([place])   . Heart murmur   . Vertigo     Past Surgical History:  Procedure Laterality Date  . COLPOSCOPY    . DILATION AND CURETTAGE OF UTERUS    . KNEE SURGERY    . TUBAL LIGATION      Family History  Problem Relation Age of Onset  . Heart disease Paternal Grandmother   . Hypertension Paternal Grandmother   . Diabetes Maternal Grandmother   . Hypertension Maternal Grandmother   . Cancer Father        colon  . Breast cancer Mother   . Heart disease Mother   . Diabetes Mother   . Hypertension Mother   . Diabetes Sister   . Diabetes Brother   . Hodgkin's lymphoma Sister   . Heart disease Brother   . Diabetes Brother     Social History   Socioeconomic History  . Marital status: Divorced    Spouse name: Not on file  . Number of children: Not on file  . Years of education: Not on file  . Highest education level: Not on file  Occupational History  . Not on file  Tobacco Use  . Smoking status: Never Smoker  . Smokeless tobacco: Never Used  Substance and Sexual Activity  . Alcohol use: No  . Drug use: No  . Sexual activity: Not Currently    Birth control/protection: None  Other Topics Concern  . Not on file  Social History Narrative  . Not on file  Social Determinants of Health   Financial Resource Strain:   . Difficulty of Paying Living Expenses:   Food Insecurity:   . Worried About Charity fundraiser in the Last Year:   . Arboriculturist in the Last Year:   Transportation Needs:   . Film/video editor (Medical):   Marland Kitchen Lack of Transportation (Non-Medical):   Physical Activity:   . Days of Exercise per Week:   . Minutes of Exercise per Session:   Stress:   . Feeling of Stress :   Social Connections:   . Frequency of Communication with Friends and Family:   . Frequency of Social Gatherings with Friends and Family:   . Attends Religious Services:   . Active Member of  Clubs or Organizations:   . Attends Archivist Meetings:   Marland Kitchen Marital Status:      Observations/Objective: Awake, alert and oriented x 3   Review of Systems  Constitutional: Negative for fever, malaise/fatigue and weight loss.       Hot flashes  HENT: Negative.  Negative for nosebleeds.   Eyes: Negative.  Negative for blurred vision, double vision and photophobia.  Respiratory: Negative.  Negative for cough and shortness of breath.   Cardiovascular: Negative.  Negative for chest pain, palpitations and leg swelling.  Gastrointestinal: Positive for heartburn. Negative for nausea and vomiting.  Musculoskeletal: Negative.  Negative for myalgias.  Neurological: Negative.  Negative for dizziness, focal weakness, seizures and headaches.  Psychiatric/Behavioral: Negative.  Negative for suicidal ideas.    Assessment and Plan: Anna Mann was seen today for follow-up.  Diagnoses and all orders for this visit:  Type 2 diabetes mellitus with hyperglycemia, without long-term current use of insulin (HCC) Continue blood sugar control as discussed in office today, low carbohydrate diet, and regular physical exercise as tolerated, 150 minutes per week (30 min each day, 5 days per week, or 50 min 3 days per week). Keep blood sugar logs with fasting goal of 90-130 mg/dl, post prandial (after you eat) less than 180.  For Hypoglycemia: BS <60 and Hyperglycemia BS >400; contact the clinic ASAP. Annual eye exams and foot exams are recommended.   Dyslipidemia, goal LDL below 70 INSTRUCTIONS: Work on a low fat, heart healthy diet and participate in regular aerobic exercise program by working out at least 150 minutes per week; 5 days a week-30 minutes per day. Avoid red meat/beef/steak,  fried foods. junk foods, sodas, sugary drinks, unhealthy snacking, alcohol and smoking.  Drink at least 80 oz of water per day and monitor your carbohydrate intake daily.    Gastroesophageal reflux disease,  unspecified whether esophagitis present -     omeprazole (PRILOSEC) 20 MG capsule; Take 1 capsule (20 mg total) by mouth 2 (two) times daily before a meal. INSTRUCTIONS: Avoid GERD Triggers: acidic, spicy or fried foods, caffeine, coffee, sodas,  alcohol and chocolate.   Other orders -     PARoxetine (PAXIL) 10 MG tablet; Take 1 tablet (10 mg total) by mouth daily.     Follow Up Instructions Return in about 3 months (around 05/14/2020).     I discussed the assessment and treatment plan with the patient. The patient was provided an opportunity to ask questions and all were answered. The patient agreed with the plan and demonstrated an understanding of the instructions.   The patient was advised to call back or seek an in-person evaluation if the symptoms worsen or if the condition fails to improve as anticipated.  I provided 17 minutes of non-face-to-face time during this encounter including median intraservice time, reviewing previous notes, labs, imaging, medications and explaining diagnosis and management.  Gildardo Pounds, FNP-BC

## 2020-02-16 ENCOUNTER — Ambulatory Visit: Payer: BLUE CROSS/BLUE SHIELD | Attending: Nurse Practitioner

## 2020-02-16 ENCOUNTER — Other Ambulatory Visit: Payer: Self-pay

## 2020-02-16 DIAGNOSIS — Z13228 Encounter for screening for other metabolic disorders: Secondary | ICD-10-CM

## 2020-02-16 DIAGNOSIS — Z114 Encounter for screening for human immunodeficiency virus [HIV]: Secondary | ICD-10-CM

## 2020-02-16 DIAGNOSIS — Z13220 Encounter for screening for lipoid disorders: Secondary | ICD-10-CM

## 2020-02-16 DIAGNOSIS — E1165 Type 2 diabetes mellitus with hyperglycemia: Secondary | ICD-10-CM

## 2020-02-16 DIAGNOSIS — Z130 Encounter for screening for diseases of the blood and blood-forming organs and certain disorders involving the immune mechanism: Secondary | ICD-10-CM

## 2020-02-16 DIAGNOSIS — Z1159 Encounter for screening for other viral diseases: Secondary | ICD-10-CM

## 2020-02-17 LAB — LIPID PANEL
Chol/HDL Ratio: 3.6 ratio (ref 0.0–4.4)
Cholesterol, Total: 123 mg/dL (ref 100–199)
HDL: 34 mg/dL — ABNORMAL LOW (ref >39)
LDL Chol Calc (NIH): 73 mg/dL (ref 0–99)
Triglycerides: 79 mg/dL (ref 0–149)
VLDL Cholesterol Cal: 16 mg/dL (ref 5–40)

## 2020-02-17 LAB — HEMOGLOBIN A1C
Est. average glucose Bld gHb Est-mCnc: 131 mg/dL
Hgb A1c MFr Bld: 6.2 % — ABNORMAL HIGH (ref 4.8–5.6)

## 2020-02-17 LAB — CMP14+EGFR
ALT: 16 IU/L (ref 0–32)
AST: 12 IU/L (ref 0–40)
Albumin/Globulin Ratio: 1.7 (ref 1.2–2.2)
Albumin: 4 g/dL (ref 3.8–4.9)
Alkaline Phosphatase: 56 IU/L (ref 39–117)
BUN/Creatinine Ratio: 8 — ABNORMAL LOW (ref 9–23)
BUN: 5 mg/dL — ABNORMAL LOW (ref 6–24)
Bilirubin Total: <0.2 mg/dL (ref 0.0–1.2)
CO2: 21 mmol/L (ref 20–29)
Calcium: 8.8 mg/dL (ref 8.7–10.2)
Chloride: 109 mmol/L — ABNORMAL HIGH (ref 96–106)
Creatinine, Ser: 0.61 mg/dL (ref 0.57–1.00)
GFR calc Af Amer: 116 mL/min/{1.73_m2} (ref >59)
GFR calc non Af Amer: 100 mL/min/{1.73_m2} (ref >59)
Globulin, Total: 2.3 g/dL (ref 1.5–4.5)
Glucose: 148 mg/dL — ABNORMAL HIGH (ref 65–99)
Potassium: 3.8 mmol/L (ref 3.5–5.2)
Sodium: 143 mmol/L (ref 134–144)
Total Protein: 6.3 g/dL (ref 6.0–8.5)

## 2020-02-17 LAB — CBC
Hematocrit: 35.1 % (ref 34.0–46.6)
Hemoglobin: 11.7 g/dL (ref 11.1–15.9)
MCH: 29.6 pg (ref 26.6–33.0)
MCHC: 33.3 g/dL (ref 31.5–35.7)
MCV: 89 fL (ref 79–97)
Platelets: 184 10*3/uL (ref 150–450)
RBC: 3.95 x10E6/uL (ref 3.77–5.28)
RDW: 13 % (ref 11.7–15.4)
WBC: 7.9 10*3/uL (ref 3.4–10.8)

## 2020-02-17 LAB — MICROALBUMIN / CREATININE URINE RATIO
Creatinine, Urine: 223.1 mg/dL
Microalb/Creat Ratio: 25 mg/g creat (ref 0–29)
Microalbumin, Urine: 56.6 ug/mL

## 2020-02-17 LAB — HEPATITIS C ANTIBODY: Hep C Virus Ab: <0.1 s/co ratio (ref 0.0–0.9)

## 2020-02-17 LAB — HIV ANTIBODY (ROUTINE TESTING W REFLEX): HIV Screen 4th Generation wRfx: NONREACTIVE

## 2020-03-09 ENCOUNTER — Encounter: Payer: Self-pay | Admitting: Nurse Practitioner

## 2020-03-17 ENCOUNTER — Ambulatory Visit: Payer: BLUE CROSS/BLUE SHIELD | Admitting: Obstetrics and Gynecology

## 2020-03-18 ENCOUNTER — Encounter: Payer: Self-pay | Admitting: Obstetrics and Gynecology

## 2020-03-18 ENCOUNTER — Other Ambulatory Visit: Payer: Self-pay

## 2020-03-18 ENCOUNTER — Ambulatory Visit (INDEPENDENT_AMBULATORY_CARE_PROVIDER_SITE_OTHER): Payer: BLUE CROSS/BLUE SHIELD | Admitting: Obstetrics and Gynecology

## 2020-03-18 VITALS — BP 128/86 | HR 87 | Ht 63.0 in | Wt 267.1 lb

## 2020-03-18 DIAGNOSIS — D219 Benign neoplasm of connective and other soft tissue, unspecified: Secondary | ICD-10-CM | POA: Diagnosis not present

## 2020-03-18 DIAGNOSIS — N950 Postmenopausal bleeding: Secondary | ICD-10-CM

## 2020-03-18 MED ORDER — METRONIDAZOLE 0.75 % VA GEL: 1.0000 | Freq: Every day | VAGINAL | 1 refills | Status: DC

## 2020-03-18 MED FILL — metroNIDAZOLE 0.75 % GEL: 0.75 | 5 days supply | Qty: 70 | Fill #0

## 2020-03-18 NOTE — Progress Notes (Signed)
Anna Mann presents for follow up for PMB. W/U uterine fibroids and negative EMBX. Started on Megace 20 mg daily. Doing well. Did have an episode last month of heavy bleeding but no more episodes since. She does report some vaginal odor.  PE AF VSS Lungs clear Heart RRR Abd soft + BS  A/P PMB        Uterine fibroids  Will continue with Megace. Metrogel for vaginal odor. F/U in 4 months.

## 2020-03-29 MED FILL — PRAVASTATIN SODIUM 20 MG TA: 20 | 90 days supply | Qty: 90 | Fill #2

## 2020-03-29 MED FILL — GLIMEPIRIDE 2 MG TABS: 2 | 60 days supply | Qty: 60 | Fill #2

## 2020-03-29 MED FILL — FLUTICASONE PROP 50 MCG SPR: 50 | 30 days supply | Qty: 16 | Fill #0

## 2020-05-13 NOTE — Telephone Encounter (Signed)
error 

## 2020-05-17 ENCOUNTER — Encounter: Payer: Self-pay | Admitting: Nurse Practitioner

## 2020-05-17 ENCOUNTER — Other Ambulatory Visit: Payer: Self-pay | Admitting: Nurse Practitioner

## 2020-05-17 MED ORDER — TRIAMCINOLONE ACETONIDE 0.1 % EX CREA
1.0000 | TOPICAL_CREAM | Freq: Two times a day (BID) | CUTANEOUS | 0 refills | Status: DC
Start: 2020-05-17 — End: 2020-12-15

## 2020-05-21 ENCOUNTER — Ambulatory Visit: Payer: BLUE CROSS/BLUE SHIELD | Admitting: Nurse Practitioner

## 2020-05-24 ENCOUNTER — Other Ambulatory Visit: Payer: Self-pay | Admitting: Nurse Practitioner

## 2020-05-24 DIAGNOSIS — E1165 Type 2 diabetes mellitus with hyperglycemia: Secondary | ICD-10-CM

## 2020-05-25 MED FILL — TRIAMCINOLONE 0.1% CREAM: 0.1 | 14 days supply | Qty: 60 | Fill #0

## 2020-05-25 MED FILL — GLIMEPIRIDE 2 MG TABLET: 2 | 60 days supply | Qty: 60 | Fill #0

## 2020-05-25 MED FILL — metroNIDAZOLE 0.75 % GEL: 0.75 | 5 days supply | Qty: 70 | Fill #0

## 2020-05-25 MED FILL — MEGESTROL 40 MG TABLET: 40 | 15 days supply | Qty: 60 | Fill #0

## 2020-07-15 MED FILL — IBUPROFEN 800 MG TABS: 800 | 10 days supply | Qty: 30 | Fill #0

## 2020-07-15 MED FILL — AMOXICILLIN 500 MG CAPSULE: 500 | 10 days supply | Qty: 30 | Fill #0

## 2020-07-15 MED FILL — PRAVASTATIN SODIUM 20 MG TA: 20 | 90 days supply | Qty: 90 | Fill #0

## 2020-07-15 MED FILL — GLIMEPIRIDE 2 MG TABLET: 2 | 90 days supply | Qty: 90 | Fill #0

## 2020-07-16 MED FILL — HYDROCODON-APAP 5-325: 5-325 | 3 days supply | Qty: 14 | Fill #0

## 2020-08-02 ENCOUNTER — Telehealth: Payer: Self-pay | Admitting: Family

## 2020-08-02 DIAGNOSIS — B9689 Other specified bacterial agents as the cause of diseases classified elsewhere: Secondary | ICD-10-CM

## 2020-08-02 DIAGNOSIS — J019 Acute sinusitis, unspecified: Secondary | ICD-10-CM

## 2020-08-02 MED ORDER — AMOXICILLIN-POT CLAVULANATE 875-125 MG PO TABS: 1.0000 | ORAL_TABLET | Freq: Two times a day (BID) | ORAL | 0 refills | Status: DC

## 2020-08-02 MED ORDER — PROMETHAZINE-DM 6.25-15 MG/5ML PO SYRP: 5.0000 mL | ORAL_SOLUTION | Freq: Four times a day (QID) | ORAL | 0 refills | Status: DC | PRN

## 2020-08-02 MED FILL — PROMETHAZINE W/DM SYRUP: 6.25-15 | 6 days supply | Qty: 118 | Fill #0

## 2020-08-02 MED FILL — AMOX-CLAV 875-125 MG TABLET: 875-125 | 7 days supply | Qty: 14 | Fill #0

## 2020-08-02 NOTE — Addendum Note (Signed)
Addended by: Dutch Quint B on: 08/02/2020 02:47 PM   Modules accepted: Orders

## 2020-08-02 NOTE — Progress Notes (Signed)

## 2020-10-05 ENCOUNTER — Other Ambulatory Visit (HOSPITAL_COMMUNITY): Payer: Self-pay | Admitting: *Deleted

## 2020-10-05 MED FILL — IBUPROFEN 800 MG TABS: 800 | 4 days supply | Qty: 14 | Fill #0

## 2020-10-07 ENCOUNTER — Other Ambulatory Visit (HOSPITAL_COMMUNITY): Payer: Self-pay | Admitting: *Deleted

## 2020-10-07 MED FILL — traMADol HCL 50 MG TABS: 50 | 3 days supply | Qty: 12 | Fill #0

## 2020-11-03 MED FILL — MEGESTROL 40 MG TABLET: 40 | 15 days supply | Qty: 60 | Fill #1

## 2020-11-03 MED FILL — OMEPRAZOLE DR 20 MG CAPSULE: 20 | 90 days supply | Qty: 180 | Fill #0

## 2020-11-03 MED FILL — PRAVASTATIN SODIUM 20 MG TA: 20 | 60 days supply | Qty: 60 | Fill #1

## 2020-11-03 MED FILL — GLIMEPIRIDE 2 MG TABLET: 2 | 90 days supply | Qty: 90 | Fill #1

## 2020-12-10 IMAGING — CT CT ABD-PELV W/O CM
2 of 4 series · 17 of 46 positions shown, 19 images · non-contrast
Comparison: None.

CLINICAL DATA: Epigastric pain for the last week. Bloating that
radiates to chest. Increased vaginal bleeding.

EXAM:
CT ABDOMEN AND PELVIS WITHOUT CONTRAST
TECHNIQUE: Multidetector CT imaging of the abdomen and pelvis was performed
following the standard protocol without IV contrast.

[Series 3: ap without · axial · non-contrast · 0.96mm/px · z∈[+511,+921]mm · 14 of 94 slices shown, 16 images]
[im 6/94  soft-tissue]
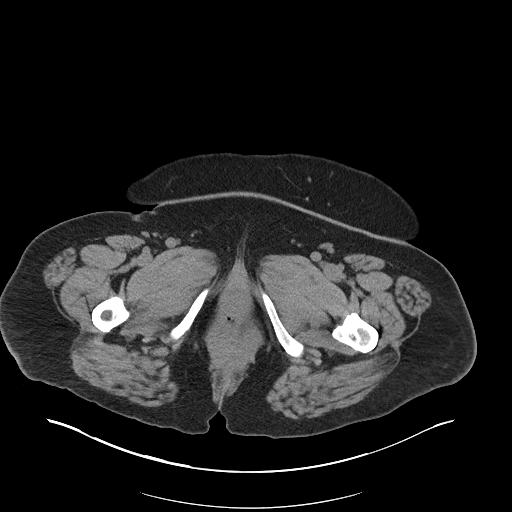
[im 6/94  bone]
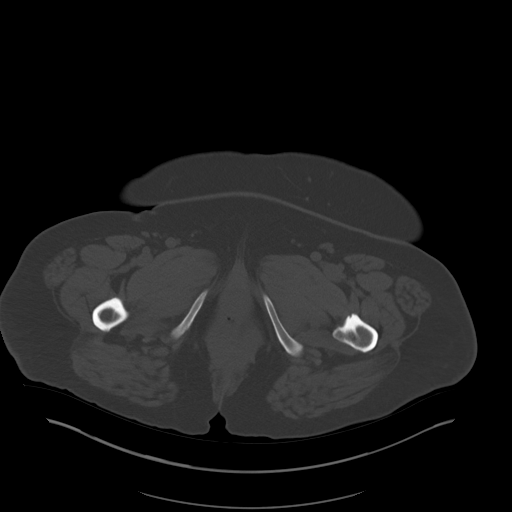
[im 12/94  soft-tissue]
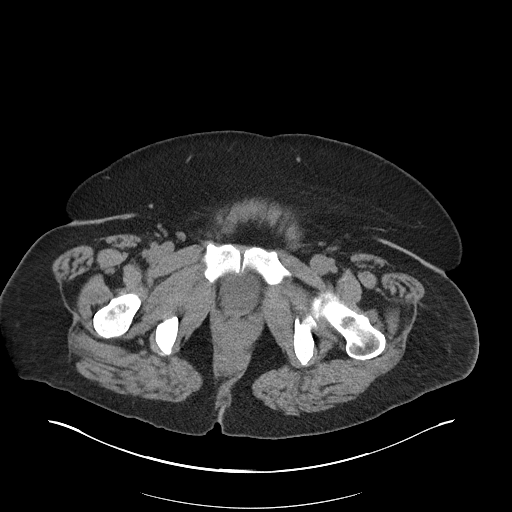
[im 18/94  soft-tissue]
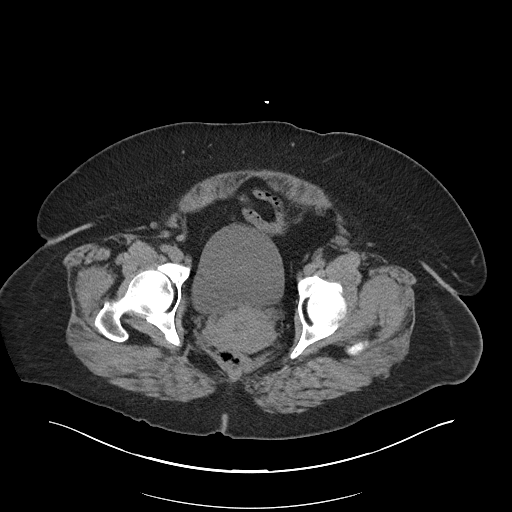
[im 24/94  soft-tissue]
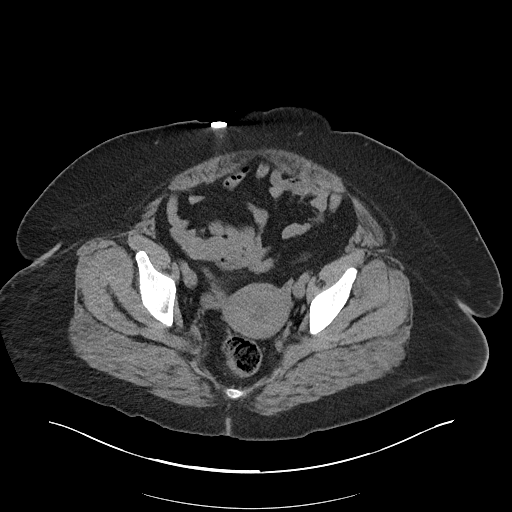
[im 30/94  soft-tissue]
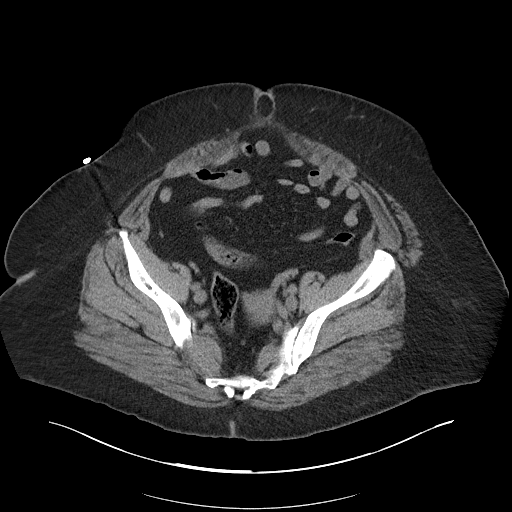
[im 35/94  soft-tissue]
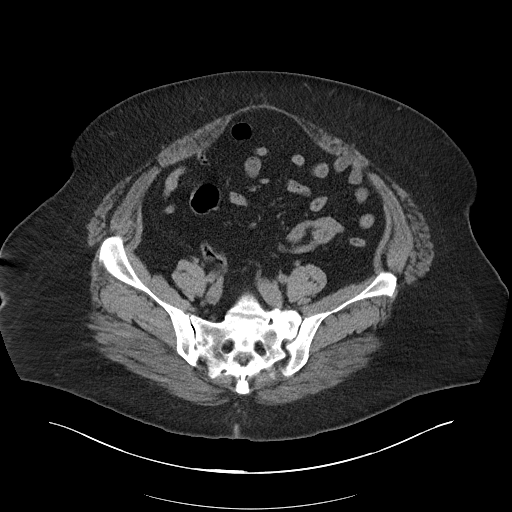
[im 41/94  soft-tissue]
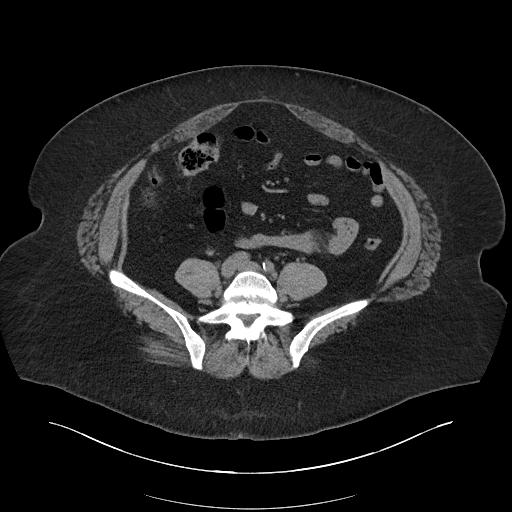
[im 53/94  soft-tissue]
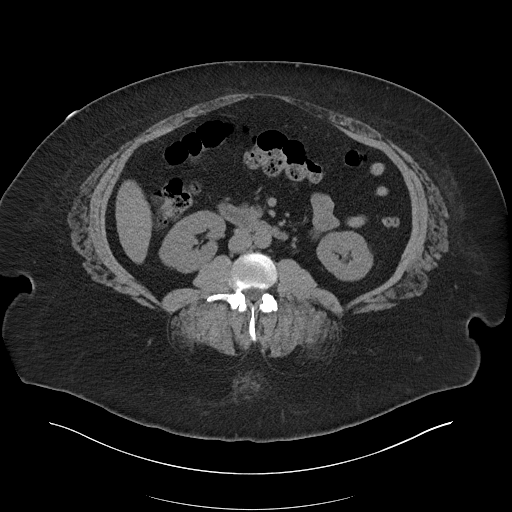
[im 59/94  soft-tissue]
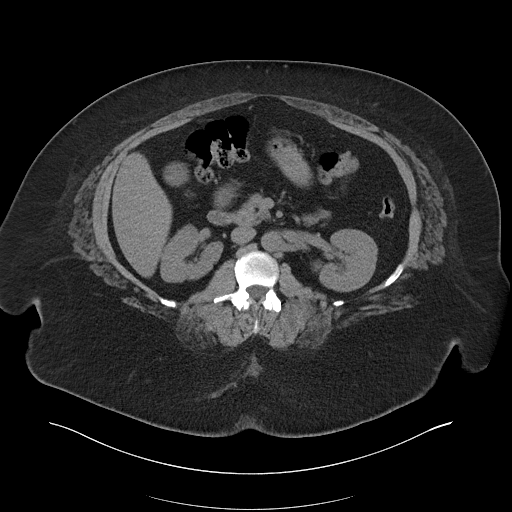
[im 59/94  bone]
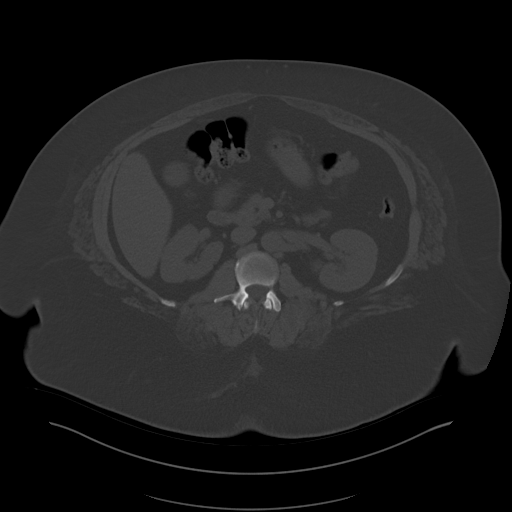
[im 64/94  soft-tissue]
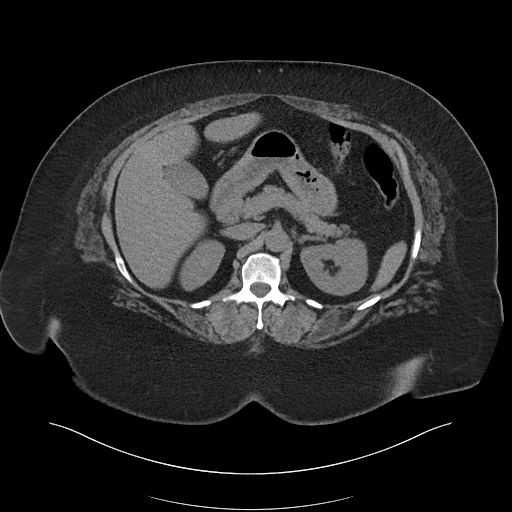
[im 70/94  soft-tissue]
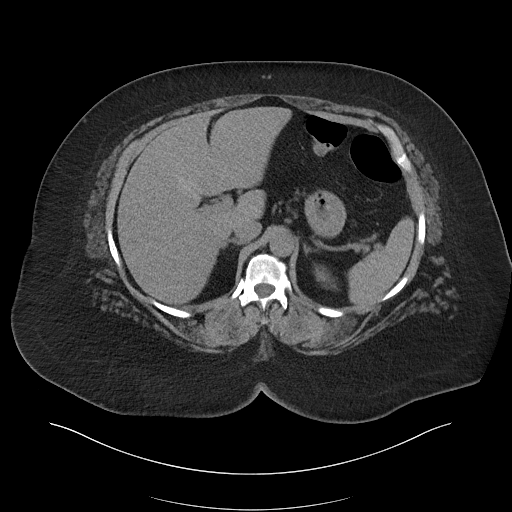
[im 76/94  soft-tissue]
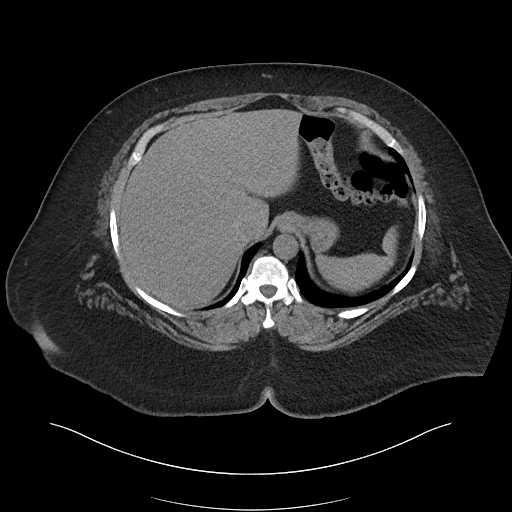
[im 82/94  soft-tissue]
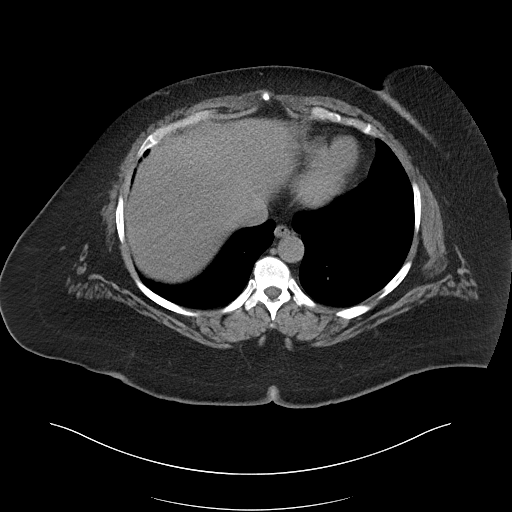
[im 88/94  soft-tissue]
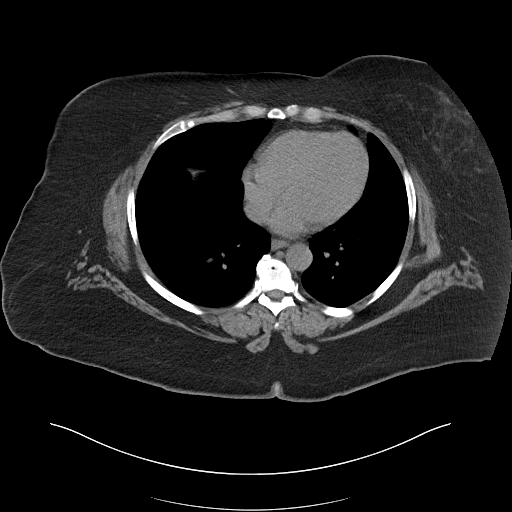

[Series 6: cor · coronal · 0.97mm/px · 3 of 128 slices shown]
[im 43/128  soft-tissue]
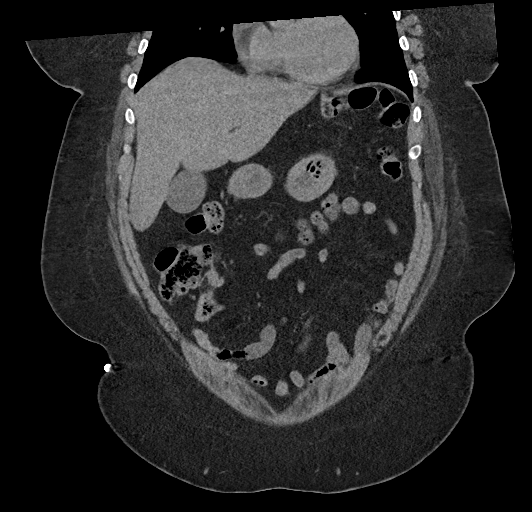
[im 57/128  soft-tissue]
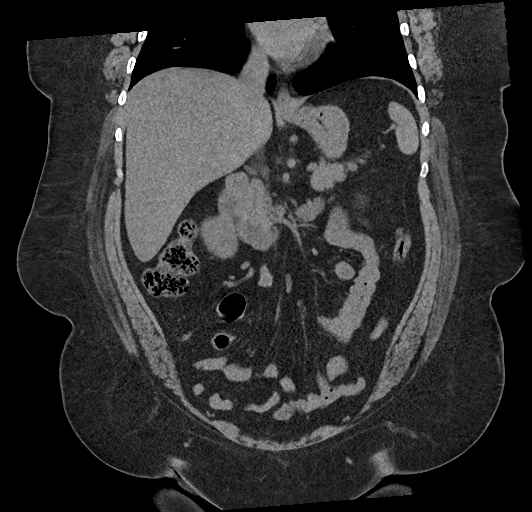
[im 71/128  soft-tissue]
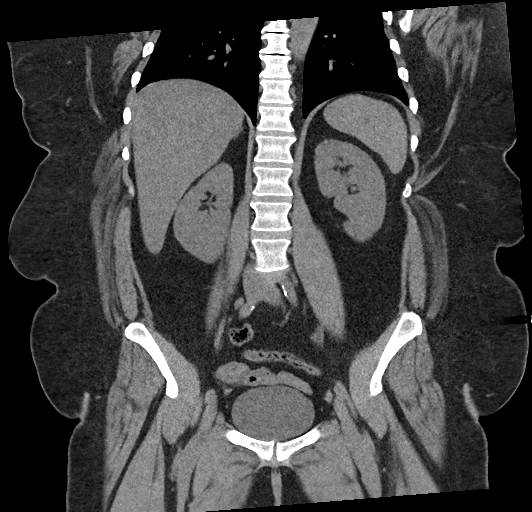

[17 of 46 positions shown; findings below may reference images not displayed]

FINDINGS: Lower chest: Lung bases are clear.

Hepatobiliary: No focal hepatic lesion. No biliary duct dilatation.
Gallbladder is normal. Common bile duct is normal.

Pancreas: Pancreas is normal. No ductal dilatation. No pancreatic
inflammation.

Spleen: Normal spleen

Adrenals/urinary tract: Adrenal glands and kidneys are normal. The
ureters and bladder normal.

Stomach/Bowel: Stomach, small bowel, appendix, and cecum are normal.
The colon and rectosigmoid colon are normal.

Vascular/Lymphatic: Abdominal aorta is normal caliber. No periportal
or retroperitoneal adenopathy. No pelvic adenopathy.

Reproductive: Uterus and adnexa are normal by CT imaging. Uterus
poorly evaluated by CT.

Other: A small fat filled umbilical hernia measures 1.6 cm in
greatest dimension. No inguinal hernia

Musculoskeletal: No aggressive osseous lesion.
IMPRESSION: 1. No acute findings in the abdomen pelvis.
2. No explanation for epigastric pain.

## 2020-12-15 ENCOUNTER — Ambulatory Visit
Admission: EM | Admit: 2020-12-15 | Discharge: 2020-12-15 | Disposition: A | Discharge status: Home / Self Care | Payer: No Typology Code available for payment source | Attending: Internal Medicine | Admitting: Internal Medicine

## 2020-12-15 ENCOUNTER — Other Ambulatory Visit: Payer: Self-pay

## 2020-12-15 ENCOUNTER — Other Ambulatory Visit: Payer: Self-pay | Admitting: Internal Medicine

## 2020-12-15 DIAGNOSIS — R42 Dizziness and giddiness: Secondary | ICD-10-CM | POA: Diagnosis not present

## 2020-12-15 MED ORDER — MECLIZINE HCL 25 MG PO TABS
25.0000 mg | ORAL_TABLET | Freq: Three times a day (TID) | ORAL | 0 refills | Status: DC | PRN
Start: 2020-12-15 — End: 2020-12-15

## 2020-12-15 MED ORDER — ONDANSETRON 4 MG PO TBDP
4.0000 mg | ORAL_TABLET | Freq: Three times a day (TID) | ORAL | 0 refills | Status: DC | PRN
Start: 2020-12-15 — End: 2020-12-15

## 2020-12-15 MED FILL — ONDANSETRON ODT 4 MG TABLET: 4 | 7 days supply | Qty: 20 | Fill #0

## 2020-12-15 MED FILL — MECLIZINE 25 MG TABLET: 25 | 10 days supply | Qty: 30 | Fill #0

## 2020-12-15 NOTE — ED Triage Notes (Signed)
Pt here for dizziness with room spinning that feels like vertigo with hx of same; pt sts some nausea; pt sts left ear pain today and facial pain

## 2020-12-15 NOTE — ED Provider Notes (Signed)
[facility]    CSN: [id number] Arrival date & time: 12/15/20  1052      History   Chief Complaint Chief Complaint  Patient presents with  . Dizziness  . Otalgia    HPI Denyce Mann is a 60 y.o. female comes to urgent care with complaints of dizziness and a feeling of room spinning as well as facial pain of a few days duration.  Patient symptoms started a few days ago and has been persistent.  Over the past day or so she complains of left ear pain with fullness as well as facial pain.  No ringing in the ears.  Patient has seasonal allergies.  No fever or chills.  Patient's grand child is just recovering from viral illness.  No nausea, vomiting or diarrhea.   HPI  Past Medical History:  Diagnosis Date  . Abnormal Pap smear    colposcopy  . Diabetes mellitus without complication (HCC)   . Heart murmur   . Vertigo     Patient Active Problem List   Diagnosis Date Noted  . Postmenopausal bleeding 12/18/2019  . Fibroids 12/18/2019    Past Surgical History:  Procedure Laterality Date  . COLPOSCOPY    . DILATION AND CURETTAGE OF UTERUS    . KNEE SURGERY    . TUBAL LIGATION      OB History    Gravida  3   Para  3   Term  3   Preterm      AB      Living  3     SAB      IAB      Ectopic      Multiple      Live Births               Home Medications    Prior to Admission medications   Medication Sig Start Date End Date Taking? Authorizing Provider  meclizine (ANTIVERT) 25 MG tablet Take 1 tablet (25 mg total) by mouth 3 (three) times daily as needed for dizziness. 12/15/20  Yes Taj Nevins, Britta Mccreedy, MD  ondansetron (ZOFRAN ODT) 4 MG disintegrating tablet Take 1 tablet (4 mg total) by mouth every 8 (eight) hours as needed for nausea or vomiting. 12/15/20  Yes Wilmoth Rasnic, Britta Mccreedy, MD  glimepiride (AMARYL) 2 MG tablet TAKE 1 TABLET (2 MG TOTAL) BY MOUTH DAILY BEFORE BREAKFAST. 05/24/20   Claiborne Rigg, NP  glucose blood (TRUE METRIX BLOOD  GLUCOSE TEST) test strip Use as instructed. Check blood glucose level by fingerstick twice per day.  E11.65 11/22/19   Claiborne Rigg, NP  megestrol (MEGACE) 40 MG tablet Take 0.5 tablets (20 mg total) by mouth daily. Can increase to two tablets twice a day in the event of heavy bleeding 12/18/19   Hermina Staggers, MD  Multiple Vitamin (MULTIVITAMIN) tablet Take 1 tablet by mouth daily.    [provider]  omeprazole (PRILOSEC) 20 MG capsule Take 1 capsule (20 mg total) by mouth 2 (two) times daily before a meal. 02/12/20 05/12/20  Claiborne Rigg, NP  PARoxetine (PAXIL) 10 MG tablet Take 1 tablet (10 mg total) by mouth daily. 02/12/20 03/13/20  Claiborne Rigg, NP  pravastatin (PRAVACHOL) 20 MG tablet Take 1 tablet (20 mg total) by mouth daily. 11/22/19   Claiborne Rigg, NP  TRUEplus Lancets 28G MISC Use as instructed. Check blood glucose level by fingerstick twice per day. 11/22/19   Claiborne Rigg, NP  fluticasone (  FLONASE) 50 MCG/ACT nasal spray Place 1 spray into both nostrils daily. Patient not taking: Reported on 03/18/2020 01/25/20 12/15/20  Claiborne Rigg, NP    Family History Family History  Problem Relation Age of Onset  . Heart disease Paternal Grandmother   . Hypertension Paternal Grandmother   . Diabetes Maternal Grandmother   . Hypertension Maternal Grandmother   . Cancer Father        colon  . Breast cancer Mother   . Heart disease Mother   . Diabetes Mother   . Hypertension Mother   . Diabetes Sister   . Diabetes Brother   . Hodgkin's lymphoma Sister   . Heart disease Brother   . Diabetes Brother     Social History Social History   Tobacco Use  . Smoking status: Never Smoker  . Smokeless tobacco: Never Used  Vaping Use  . Vaping Use: Never used  Substance Use Topics  . Alcohol use: No  . Drug use: No     Allergies   Iodine and Shellfish allergy   Review of Systems Review of Systems  Constitutional: Negative.   HENT: Positive for ear pain.  Negative for ear discharge, sore throat, tinnitus and voice change.   Gastrointestinal: Negative for abdominal pain, diarrhea, nausea and vomiting.  Genitourinary: Negative.   Neurological: Negative for dizziness, weakness and headaches.     Physical Exam Triage Vital Signs ED Triage Vitals  Enc Vitals Group     BP 12/15/20 1322 (!) 159/85     Pulse Rate 12/15/20 1322 81     Resp 12/15/20 1322 18     Temp 12/15/20 1322 98 F (36.7 C)     Temp Source 12/15/20 1322 Oral     SpO2 12/15/20 1322 97 %     Weight --      Height --      Head Circumference --      Peak Flow --      Pain Score 12/15/20 1323 5     Pain Loc --      Pain Edu? --      Excl. in GC? --    No data found.  Updated Vital Signs BP (!) 159/85 (BP Location: Right Arm)   Pulse 81   Temp 98 F (36.7 C) (Oral)   Resp 18   LMP  (LMP Unknown) Comment: irregular  SpO2 97%   Visual Acuity Right Eye Distance:   Left Eye Distance:   Bilateral Distance:    Right Eye Near:   Left Eye Near:    Bilateral Near:     Physical Exam Vitals and nursing note reviewed.  Constitutional:      General: She is not in acute distress.    Appearance: She is not ill-appearing.  HENT:     Right Ear: Tympanic membrane normal. There is no impacted cerumen.     Left Ear: Tympanic membrane normal. There is no impacted cerumen.     Mouth/Throat:     Pharynx: No oropharyngeal exudate or posterior oropharyngeal erythema.  Cardiovascular:     Rate and Rhythm: Normal rate and regular rhythm.     Pulses: Normal pulses.  Pulmonary:     Effort: Pulmonary effort is normal.     Breath sounds: Normal breath sounds.  Musculoskeletal:        General: Normal range of motion.  Neurological:     Mental Status: She is alert.      UC Treatments / Results  Labs (  all labs ordered are listed, but only abnormal results are displayed) Labs Reviewed - No data to display  EKG   Radiology No results found.  Procedures Procedures  (including critical care time)  Medications Ordered in UC Medications - No data to display  Initial Impression / Assessment and Plan / UC Course  I have reviewed the triage vital signs and the nursing notes.  Pertinent labs & imaging results that were available during my care of the patient were reviewed by me and considered in my medical decision making (see chart for details).     1.  Acute onset of vertigo (likely acute vestibular neuronitis): Saline nasal spray Patient does not tolerate Mucinex Meclizine 25 mg 3 times daily as needed for dizziness/vertigo Zofran as needed for nausea Increase oral fluid intake Return precautions given. Final Clinical Impressions(s) / UC Diagnoses   Final diagnoses:  Acute onset of severe vertigo     Discharge Instructions     Please use saline nasal spray as needed If you have worsening symptoms he can try a dose of promethazine DM at bedtime for cough Take medications as prescribed If you develop persistent dizziness, nausea or vomiting please return to urgent care to be reevaluated.   ED Prescriptions    Medication Sig Dispense Auth. Provider   meclizine (ANTIVERT) 25 MG tablet Take 1 tablet (25 mg total) by mouth 3 (three) times daily as needed for dizziness. 30 tablet Dezra Mandella, Britta Mccreedy, MD   ondansetron (ZOFRAN ODT) 4 MG disintegrating tablet Take 1 tablet (4 mg total) by mouth every 8 (eight) hours as needed for nausea or vomiting. 20 tablet Crystalyn Delia, Britta Mccreedy, MD     PDMP not reviewed this encounter.   Merrilee Jansky, MD 12/15/20 [phone]

## 2020-12-15 NOTE — Discharge Instructions (Addendum)
Please use saline nasal spray as needed If you have worsening symptoms he can try a dose of promethazine DM at bedtime for cough Take medications as prescribed If you develop persistent dizziness, nausea or vomiting please return to urgent care to be reevaluated.

## 2020-12-21 ENCOUNTER — Other Ambulatory Visit (HOSPITAL_COMMUNITY): Payer: Self-pay | Admitting: Physician Assistant

## 2020-12-21 MED FILL — LISINOPRIL 10 MG TABS: 10 | 90 days supply | Qty: 90 | Fill #0

## 2020-12-24 ENCOUNTER — Other Ambulatory Visit (HOSPITAL_COMMUNITY): Payer: Self-pay | Admitting: Physician Assistant

## 2020-12-24 MED FILL — RYBELSUS 3 MG TABS: 3 | 30 days supply | Qty: 30 | Fill #0

## 2020-12-24 MED FILL — ATORVASTATIN CALCIUM 20 MG: 20 | 30 days supply | Qty: 30 | Fill #0

## 2021-01-07 ENCOUNTER — Ambulatory Visit: Payer: No Typology Code available for payment source | Admitting: Dietician

## 2021-01-28 ENCOUNTER — Other Ambulatory Visit (HOSPITAL_COMMUNITY): Payer: Self-pay | Admitting: Physician Assistant

## 2021-01-28 MED FILL — JARDIANCE 10 MG TABLET: 10 | 30 days supply | Qty: 30 | Fill #0

## 2021-01-28 MED FILL — RYBELSUS 3 MG TABS: 3 | 30 days supply | Qty: 30 | Fill #0

## 2021-02-17 ENCOUNTER — Other Ambulatory Visit (HOSPITAL_COMMUNITY): Payer: Self-pay | Admitting: Physician Assistant

## 2021-02-17 MED FILL — ATORVASTATIN CALCIUM 20 MG: 20 | 30 days supply | Qty: 30 | Fill #0

## 2021-03-01 ENCOUNTER — Other Ambulatory Visit (HOSPITAL_COMMUNITY): Payer: Self-pay | Admitting: Physician Assistant

## 2021-03-01 MED FILL — RYBELSUS 3 MG TABS: 3 | 30 days supply | Qty: 30 | Fill #0

## 2021-03-11 ENCOUNTER — Encounter: Payer: No Typology Code available for payment source | Attending: Physician Assistant | Admitting: Registered"

## 2021-03-11 ENCOUNTER — Other Ambulatory Visit: Payer: Self-pay

## 2021-03-11 DIAGNOSIS — E1165 Type 2 diabetes mellitus with hyperglycemia: Secondary | ICD-10-CM | POA: Insufficient documentation

## 2021-03-11 NOTE — Patient Instructions (Addendum)
Consider checking your blood sugar 2x/day Fasting and 2 hours after some meals. If you want to use the meter than your insurance covers, call your doctor's office and have them send prescription to [place] for the meter that I should you in the visit today (Freestyle Lite) Record your blood sugar in the log book provided and bring to all your appointments Rethink your drink. Instead of sugar sweetened beverages, switch to water, infused water, or diet drinks. Consider picking up your prescription for additional and take as directed. If you have questions or concerns, call your doctor's office for more information.

## 2021-03-11 NOTE — Progress Notes (Signed)
[employer]  Diabetes Self-Management Education Visit Type: First/Initial  Appt. Start Time: 0800 Appt. End Time: 0930  03/11/2021  Anna Mann, identified by name and date of birth, is a 60 y.o. female with a diagnosis of Diabetes: Type 2.   ASSESSMENT  There were no vitals taken for this visit. There is no height or weight on file to calculate BMI.   A1c 8.1% 12/21/20  Medication: Rybelsus Patient states she has Rx for Jardiance but states she was not told she was supposed to start another diabetes medication so she didn't pick it up. Pt sates Glimepiride was discontinued by MD when starting Rybelsus Per referral notes cannot tolerate metformin Started Megace Jan 2020 for menstrual bleeding. Pt reports she continues to take because when she didn't take medication for 2 days bleeding was extra heavy. Pt states she will be seeing an OB to address bleeding. Pt states she had increased appetite and she felt like she had to eat 24/7. Pt reports since starting Rybelsus she has had reduced appetite.  Family Hx: Mother and Brother had diabetes. Mother had hx of amputations, brother died from diabetes related complications.   Diet: Mostly take out. 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 Usually drinks sweet tea with meals. Pt states on days lunch not provided will get fast food and eat half meal for lunch and other half for dinner. Weekends might have [employer] salad. Pt states she likes nuts, raisins and ranch dressing on salad. Pt states when she makes salad at home uses iceberg even though it causes her to hold water because it is cheaper than Romaine or spinach. Pt states she eats 7 small cookies with 1 scoop of ice cream at night to "kill the sugar craving."  Hypoglycemia: Pt states she can feel her sugar drop and doesn't like her blood sugar to go below 100 because of an experience she had last year. After not eating all day and may not have even had much water,  started feeling funny by 5-6 pm and due to having watched other people have hypoglycemia, even though she wasn't hungry stopped at [facility] and ate meal with soda and felt better.  Other concerns:  Body temperature regulation: at night gets cold, feels like she is in a freezer and also is hot.   Cramps at night and knees hurt when walking up stairs.  Acid reflux and bloating: Pt reports Oct/Nov 2021 bloated went away when started acid reflux medication and changed diet (stopped eating spaghetti sauce, pizza, cut back soda, flaming hot cheetos, also stopped laying down after eating and watch TV) Pt reports bloating returned last week and then acid reflux started a few days. Pt states she sometimes has 2-3 nabs and coffee for breakfast. Bloating with minimal food.  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  Pt states provides transportation for her son who has varied work hours and she often picks him up from work middle of the night. She is teaching him how to drive and soon he should be able to drive himself to work.   Diabetes Self-Management Education - 03/11/21 0854      Visit Information   Visit Type First/Initial      Initial Visit   Diabetes Type Type 2    Are you currently following a meal plan? No    Are you taking your medications as prescribed? Yes  Rybelsus 3 mg   Date Diagnosed 2020      Health Coping   How would you rate your overall health? Good      Psychosocial Assessment   Patient Belief/Attitude about Diabetes Motivated to manage diabetes    How often do you need to have someone help you when you read instructions, pamphlets, or other written materials from your doctor or pharmacy? 1 - Never    What is the last grade level you completed in school? associates degree      Complications   Last HgB A1C per patient/outside source 8.1 %    How often do you check your blood sugar? 0 times/day (not testing)    Have  you had a dilated eye exam in the past 12 months? No   has scheduled for today   Have you had a dental exam in the past 12 months? Yes    Are you checking your feet? Yes    How many days per week are you checking your feet? 1      Dietary Intake   Breakfast coffee, 1/2 pack nabs    Snack (morning) none    Lunch fast food, sweet tea    Snack (afternoon) none    Dinner left overs from lunch    Snack (evening) choc chip cookies, ice cream    Beverage(s) water, sweet tea, coffee      Exercise   Exercise Type Light (walking / raking leaves)    How many days per week to you exercise? 5    How many minutes per day do you exercise? 5    Total minutes per week of exercise 25      Patient Education   Previous Diabetes Education No    Disease state  Definition of diabetes, type 1 and 2, and the diagnosis of diabetes    Medications Reviewed patients medication for diabetes, action, purpose, timing of dose and side effects.    Monitoring Identified appropriate SMBG and/or A1C goals.      Individualized Goals (developed by patient)   Nutrition Other (comment)   stop drinking sweetened beverages   Medications take my medication as prescribed    Monitoring  test my blood glucose as discussed      Outcomes   Expected Outcomes Demonstrated interest in learning. Expect positive outcomes    Future DMSE 4-6 wks    Program Status Not Completed           Individualized Plan for Diabetes Self-Management Training:   Learning Objective:  Patient will have a greater understanding of diabetes self-management. Patient education plan is to attend individual and/or group sessions per assessed needs and concerns.   Patient Instructions  Consider checking your blood sugar 2x/day Fasting and 2 hours after some meals. Record your blood sugar in the log book provided and bring to all your appointments Rethink your drink. Instead of sugar sweetened beverages, switch to water, infused water, or diet  drinks. Consider picking up your prescription for additional and take as directed. If you have questions or concerns, call your doctor's office for more information.    Expected Outcomes:  Demonstrated interest in learning. Expect positive outcomes  Education material provided: ADA - How to Thrive: A Guide for Your Journey with Diabetes, A1c chart, Diabetes medications,  Glucose log book  If problems or questions, patient to contact team via:  Phone, and MyChart  Future DSME appointment: 4-6 wks

## 2021-03-28 ENCOUNTER — Other Ambulatory Visit (HOSPITAL_COMMUNITY): Payer: Self-pay

## 2021-03-28 MED ORDER — ATORVASTATIN CALCIUM 20 MG PO TABS
20.0000 mg | ORAL_TABLET | Freq: Every day | ORAL | 3 refills | Status: DC
  Filled 2021-03-28: qty 30, 30d supply, fill #0

## 2021-03-28 MED ORDER — RYBELSUS 3 MG PO TABS
3.0000 mg | ORAL_TABLET | Freq: Every day | ORAL | 2 refills | Status: DC
  Filled 2021-03-28: qty 30, 30d supply, fill #0

## 2021-03-29 ENCOUNTER — Other Ambulatory Visit (HOSPITAL_COMMUNITY): Payer: Self-pay

## 2021-04-18 ENCOUNTER — Other Ambulatory Visit (HOSPITAL_COMMUNITY): Payer: Self-pay

## 2021-04-18 MED ORDER — MEGESTROL ACETATE 40 MG PO TABS
ORAL_TABLET | ORAL | 3 refills | Status: DC
  Filled 2021-04-18: qty 90, 90d supply, fill #0
  Filled 2022-01-13: qty 90, 90d supply, fill #1
  Filled 2022-04-09: qty 90, 90d supply, fill #2

## 2021-04-22 ENCOUNTER — Encounter: Payer: No Typology Code available for payment source | Admitting: Registered"

## 2021-04-28 ENCOUNTER — Other Ambulatory Visit (HOSPITAL_COMMUNITY): Payer: Self-pay

## 2021-04-28 MED ORDER — ATORVASTATIN CALCIUM 20 MG PO TABS
20.0000 mg | ORAL_TABLET | Freq: Every day | ORAL | 3 refills | Status: DC
  Filled 2021-04-28: qty 30, 30d supply, fill #0
  Filled 2021-05-18: qty 30, 30d supply, fill #1
  Filled 2021-07-28: qty 60, 60d supply, fill #2

## 2021-04-28 MED ORDER — RYBELSUS 3 MG PO TABS
1.0000 | ORAL_TABLET | Freq: Every day | ORAL | 2 refills | Status: DC
  Filled 2021-04-28: qty 30, 30d supply, fill #0
  Filled 2021-05-19: qty 30, 30d supply, fill #1

## 2021-04-29 ENCOUNTER — Other Ambulatory Visit (HOSPITAL_COMMUNITY): Payer: Self-pay

## 2021-05-11 NOTE — H&P (Signed)
Anna Mann is an 60 y.o. [id number] who is admitted for Hysteroscopy D&C with possible Myosure polypectomy.  Patient was initially evaluated by Burman Riis, FNP-C on 01/20/2021 for AUB. Reports long-standing history of AUB and heavy bleeding. Reports benign EMB in 2020 by Dr. Arlyss Queen. She was prescribed Megace 40mg  (1/2 tablet) daily by Dr. Rip Harbour in 2021. While on Megace, she has had daily bleeding but it has become significantly lighter. Reports history of D&C in 2000s where polyps were removed.  History difficult to follow due to having had multiple providers previously - patient unable to remember names of all of them.   Work-up: Pap smear (12/20/20): NILM/HRHPV neg FSH: 6.0 (pre-menopausal range) LH: 2.8 Estradial 25.9 CBC: H/H 13/39 Ferritin 45 (wnl)  TVUS (02/18/2021): Uterus 8.72 x 6.13 x 6.44cm, endometrial thickness 1.04cm,  R ovary 2.39cm, L ovary 2.75cm. Retroverted uterus - anterior fibroid - 3.2 x 2.9 x 2.5cm. Endometrium - echogenic and thickened. Difficult to tell if there is an echogenic mass anterior to endometrium or if it is endometrial tissue. Mass measures 2.4 x 2.6 x 2.9cm. Ovaries - wnl. No adnexal masses or free fluid seen.  Patient Active Problem List   Diagnosis Date Noted  . Uncontrolled type 2 diabetes mellitus with hyperglycemia ([place]) 03/11/2021  . Postmenopausal bleeding 12/18/2019  . Fibroids 12/18/2019    MEDICAL/FAMILY/SOCIAL HX: No LMP recorded (lmp unknown). Patient is postmenopausal.    Past Medical History:  Diagnosis Date  . Abnormal Pap smear    colposcopy  . Diabetes mellitus without complication ([place])   . Heart murmur   . Vertigo     Past Surgical History:  Procedure Laterality Date  . COLPOSCOPY    . DILATION AND CURETTAGE OF UTERUS    . KNEE SURGERY    . TUBAL LIGATION      Family History  Problem Relation Age of Onset  . Heart disease Paternal Grandmother   . Hypertension Paternal Grandmother   . Diabetes Maternal  Grandmother   . Hypertension Maternal Grandmother   . Cancer Father        colon  . Breast cancer Mother   . Heart disease Mother   . Diabetes Mother   . Hypertension Mother   . Diabetes Sister   . Diabetes Brother   . Hodgkin's lymphoma Sister   . Heart disease Brother   . Diabetes Brother     Social History:  reports that she has never smoked. She has never used smokeless tobacco. She reports that she does not drink alcohol and does not use drugs.  ALLERGIES/MEDS:  Allergies:  Allergies  Allergen Reactions  . Iodine   . Shellfish Allergy     No medications prior to admission.     Review of Systems  Constitutional: Negative.   HENT: Negative.   Eyes: Negative.   Respiratory: Negative.   Cardiovascular: Negative.   Gastrointestinal: Negative.   Genitourinary: Negative.   Musculoskeletal: Negative.   Skin: Negative.   Neurological: Negative.   Endo/Heme/Allergies: Negative.   Psychiatric/Behavioral: Negative.     There were no vitals taken for this visit. Gen:  NAD, pleasant and cooperative Cardio:  RRR Pulm:  CTAB, no wheezes/rales/rhonchi Abd:  Soft, non-distended, non-tender throughout, no rebound/guarding Ext:  No bilateral LE edema, no bilateral calf tenderness Pelvic (performed by Evlyn Kanner 2/22): Vagina - normal pink mucosa without lesions or abnormal discharge, cervix - polyp at cervical os, no discharge, no CMT, uterus - difficult to assess due to body habitus, adnexa -  difficult to assess due to body habitus,   No results found for this or any previous visit (from the past 24 hour(s)).  No results found.   ASSESSMENT/PLAN: Anna Mann is a 60 y.o. [id number] who is admitted for Hysteroscopy D&C with possible Myosure polypectomy.  - Admit to [place] labs (CBC, T&S, COVID screen) - Diet:  NPO - IVF:  Per anesthesia - VTE Prophylaxis:  SCDs - Antibiotics: None indicated  Consents: I discussed with the patient that this surgery is  performed to look inside the uterus and remove the uterine lining.  Prior to surgery, the risks and benefits of the surgery, as well as alternative treatments, have been discussed.  The risks include, but are not limited to bleeding, including the need for a blood transfusion, infection, damage to organs and tissues, including uterine perforation, requiring additional surgery, postoperative pain, short-term and long-term, failure of the procedure to control symptoms, need for hysterectomy to control bleeding, fluid overload, which could create electrolyte abnormalities and the need to stop the procedure before completion, inability to safely complete the procedure, deep vein thrombosis and/or pulmonary embolism, painful intercourse, complications the course of which cannot be predicted or prevented, and death.  Patient was consented for blood products.  The patient is aware that bleeding may result in the need for a blood transfusion which includes risk of transmission of HIV (1:2 million), Hepatitis C (1:2 million), and Hepatitis B (1:200 thousand) and transfusion reaction.  Patient voiced understanding of the above risks as well as understanding of indications for blood transfusion.   Drema Dallas, DO [phone] (office)

## 2021-05-16 ENCOUNTER — Encounter (HOSPITAL_BASED_OUTPATIENT_CLINIC_OR_DEPARTMENT_OTHER): Payer: Self-pay | Admitting: Obstetrics and Gynecology

## 2021-05-16 ENCOUNTER — Other Ambulatory Visit: Payer: Self-pay

## 2021-05-16 NOTE — Progress Notes (Signed)
   05/16/21 1259  OBSTRUCTIVE SLEEP APNEA  Have you ever been diagnosed with sleep apnea through a sleep study? No  Do you snore loudly (loud enough to be heard through closed doors)?  1  Do you often feel tired, fatigued, or sleepy during the daytime (such as falling asleep during driving or talking to someone)? 0  Has anyone observed you stop breathing during your sleep? 1  Do you have, or are you being treated for high blood pressure? 1  BMI more than 35 kg/m2? 1  Age > 50 (1-yes) 1  Neck circumference greater than:Female 16 inches or larger, Female 17inches or larger? 0  Female Gender (Yes=1) 0  Obstructive Sleep Apnea Score 5  Score 5 or greater  Results sent to PCP

## 2021-05-18 ENCOUNTER — Other Ambulatory Visit (HOSPITAL_COMMUNITY): Payer: Self-pay

## 2021-05-18 MED FILL — Lisinopril Tab 10 MG: ORAL | 90 days supply | Qty: 90 | Fill #0 | Status: AC

## 2021-05-19 ENCOUNTER — Other Ambulatory Visit (HOSPITAL_COMMUNITY): Payer: Self-pay

## 2021-05-20 ENCOUNTER — Institutional Professional Consult (permissible substitution): Payer: No Typology Code available for payment source | Admitting: Plastic Surgery

## 2021-05-23 ENCOUNTER — Other Ambulatory Visit (HOSPITAL_COMMUNITY): Payer: Self-pay

## 2021-05-24 ENCOUNTER — Other Ambulatory Visit: Payer: Self-pay

## 2021-05-24 ENCOUNTER — Encounter (HOSPITAL_BASED_OUTPATIENT_CLINIC_OR_DEPARTMENT_OTHER)
Admission: RE | Admit: 2021-05-24 | Discharge: 2021-05-24 | Disposition: A | Discharge status: Home / Self Care | Payer: No Typology Code available for payment source | Source: Ambulatory Visit | Attending: Obstetrics and Gynecology | Admitting: Obstetrics and Gynecology

## 2021-05-24 DIAGNOSIS — S3769XA Other injury of uterus, initial encounter: Secondary | ICD-10-CM | POA: Diagnosis not present

## 2021-05-24 DIAGNOSIS — N939 Abnormal uterine and vaginal bleeding, unspecified: Secondary | ICD-10-CM | POA: Diagnosis not present

## 2021-05-24 DIAGNOSIS — N841 Polyp of cervix uteri: Secondary | ICD-10-CM | POA: Diagnosis not present

## 2021-05-24 DIAGNOSIS — Z01812 Encounter for preprocedural laboratory examination: Secondary | ICD-10-CM | POA: Insufficient documentation

## 2021-05-24 DIAGNOSIS — Z888 Allergy status to other drugs, medicaments and biological substances status: Secondary | ICD-10-CM | POA: Diagnosis not present

## 2021-05-24 DIAGNOSIS — Z79818 Long term (current) use of other agents affecting estrogen receptors and estrogen levels: Secondary | ICD-10-CM | POA: Diagnosis not present

## 2021-05-24 DIAGNOSIS — Z91013 Allergy to seafood: Secondary | ICD-10-CM | POA: Diagnosis not present

## 2021-05-24 DIAGNOSIS — N812 Incomplete uterovaginal prolapse: Secondary | ICD-10-CM | POA: Diagnosis not present

## 2021-05-24 DIAGNOSIS — Y768 Miscellaneous obstetric and gynecological devices associated with adverse incidents, not elsewhere classified: Secondary | ICD-10-CM | POA: Diagnosis not present

## 2021-05-24 DIAGNOSIS — N840 Polyp of corpus uteri: Secondary | ICD-10-CM | POA: Diagnosis not present

## 2021-05-24 LAB — TYPE AND SCREEN
ABO/RH(D): A POS
Antibody Screen: NEGATIVE

## 2021-05-24 LAB — CBC
HCT: 40.8 % (ref 36.0–46.0)
Hemoglobin: 13.2 g/dL (ref 12.0–15.0)
MCH: 28.9 pg (ref 26.0–34.0)
MCHC: 32.4 g/dL (ref 30.0–36.0)
MCV: 89.5 fL (ref 80.0–100.0)
Platelets: 194 10*3/uL (ref 150–400)
RBC: 4.56 MIL/uL (ref 3.87–5.11)
RDW: 12.8 % (ref 11.5–15.5)
WBC: 7.2 10*3/uL (ref 4.0–10.5)
nRBC: 0 % (ref 0.0–0.2)

## 2021-05-24 LAB — BASIC METABOLIC PANEL
Anion gap: 9 (ref 5–15)
BUN: 6 mg/dL (ref 6–20)
CO2: 21 mmol/L — ABNORMAL LOW (ref 22–32)
Calcium: 9 mg/dL (ref 8.9–10.3)
Chloride: 107 mmol/L (ref 98–111)
Creatinine, Ser: 0.69 mg/dL (ref 0.44–1.00)
GFR, Estimated: >60 mL/min (ref >60)
Glucose, Bld: 236 mg/dL — ABNORMAL HIGH (ref 70–99)
Potassium: 3.5 mmol/L (ref 3.5–5.1)
Sodium: 137 mmol/L (ref 135–145)

## 2021-05-24 NOTE — Progress Notes (Signed)
Anesthesia consult complete. Per Dr. Sabra Heck pt cleared for her planned procedure here at [facility] 05/25/21.

## 2021-05-25 ENCOUNTER — Ambulatory Visit (HOSPITAL_BASED_OUTPATIENT_CLINIC_OR_DEPARTMENT_OTHER): Payer: No Typology Code available for payment source | Admitting: Certified Registered"

## 2021-05-25 ENCOUNTER — Other Ambulatory Visit: Payer: Self-pay

## 2021-05-25 ENCOUNTER — Encounter (HOSPITAL_BASED_OUTPATIENT_CLINIC_OR_DEPARTMENT_OTHER): Payer: Self-pay | Admitting: Obstetrics and Gynecology

## 2021-05-25 ENCOUNTER — Ambulatory Visit (HOSPITAL_BASED_OUTPATIENT_CLINIC_OR_DEPARTMENT_OTHER)
Admission: RE | Admit: 2021-05-25 | Discharge: 2021-05-25 | Disposition: A | Discharge status: Home / Self Care | Payer: No Typology Code available for payment source | Attending: Obstetrics and Gynecology | Admitting: Obstetrics and Gynecology

## 2021-05-25 ENCOUNTER — Encounter (HOSPITAL_BASED_OUTPATIENT_CLINIC_OR_DEPARTMENT_OTHER)
Admission: RE | Disposition: A | Discharge status: Home / Self Care | Payer: Self-pay | Source: Home / Self Care | Attending: Obstetrics and Gynecology

## 2021-05-25 ENCOUNTER — Other Ambulatory Visit (HOSPITAL_COMMUNITY): Payer: Self-pay

## 2021-05-25 DIAGNOSIS — N939 Abnormal uterine and vaginal bleeding, unspecified: Secondary | ICD-10-CM | POA: Diagnosis not present

## 2021-05-25 DIAGNOSIS — N841 Polyp of cervix uteri: Secondary | ICD-10-CM | POA: Insufficient documentation

## 2021-05-25 DIAGNOSIS — S3769XA Other injury of uterus, initial encounter: Secondary | ICD-10-CM | POA: Insufficient documentation

## 2021-05-25 DIAGNOSIS — Z79818 Long term (current) use of other agents affecting estrogen receptors and estrogen levels: Secondary | ICD-10-CM | POA: Insufficient documentation

## 2021-05-25 DIAGNOSIS — N812 Incomplete uterovaginal prolapse: Secondary | ICD-10-CM | POA: Insufficient documentation

## 2021-05-25 DIAGNOSIS — Z888 Allergy status to other drugs, medicaments and biological substances status: Secondary | ICD-10-CM | POA: Insufficient documentation

## 2021-05-25 DIAGNOSIS — Y768 Miscellaneous obstetric and gynecological devices associated with adverse incidents, not elsewhere classified: Secondary | ICD-10-CM | POA: Insufficient documentation

## 2021-05-25 DIAGNOSIS — Z91013 Allergy to seafood: Secondary | ICD-10-CM | POA: Insufficient documentation

## 2021-05-25 DIAGNOSIS — N840 Polyp of corpus uteri: Secondary | ICD-10-CM | POA: Insufficient documentation

## 2021-05-25 HISTORY — PX: LAPAROSCOPY: SHX197

## 2021-05-25 HISTORY — DX: Abnormal uterine and vaginal bleeding, unspecified: N93.9

## 2021-05-25 HISTORY — DX: Essential (primary) hypertension: I10

## 2021-05-25 HISTORY — DX: Gastro-esophageal reflux disease without esophagitis: K21.9

## 2021-05-25 HISTORY — PX: DILATATION & CURETTAGE/HYSTEROSCOPY WITH MYOSURE: SHX6511

## 2021-05-25 LAB — GLUCOSE, CAPILLARY
Glucose-Capillary: 208 mg/dL — ABNORMAL HIGH (ref 70–99)
Glucose-Capillary: 213 mg/dL — ABNORMAL HIGH (ref 70–99)

## 2021-05-25 SURGERY — DILATATION & CURETTAGE/HYSTEROSCOPY WITH MYOSURE
Anesthesia: General | Site: Uterus

## 2021-05-25 MED ORDER — DEXAMETHASONE SODIUM PHOSPHATE 4 MG/ML IJ SOLN
INTRAMUSCULAR | Status: DC | PRN
  Administered 2021-05-25: 4 mg via INTRAVENOUS

## 2021-05-25 MED ORDER — MIDAZOLAM HCL 5 MG/5ML IJ SOLN
INTRAMUSCULAR | Status: DC | PRN
  Administered 2021-05-25: 2 mg via INTRAVENOUS

## 2021-05-25 MED ORDER — PROMETHAZINE HCL 25 MG/ML IJ SOLN
6.2500 mg | INTRAMUSCULAR | Status: DC | PRN
Start: 2021-05-25 — End: 2021-05-25

## 2021-05-25 MED ORDER — ONDANSETRON HCL 4 MG/2ML IJ SOLN
INTRAMUSCULAR | Status: DC | PRN
  Administered 2021-05-25 (×2): 4 mg via INTRAVENOUS

## 2021-05-25 MED ORDER — CEFAZOLIN SODIUM-DEXTROSE 2-3 GM-%(50ML) IV SOLR
INTRAVENOUS | Status: DC | PRN
  Administered 2021-05-25: 2 g via INTRAVENOUS

## 2021-05-25 MED ORDER — FENTANYL CITRATE (PF) 100 MCG/2ML IJ SOLN
INTRAMUSCULAR | Status: AC
  Filled 2021-05-25: qty 2

## 2021-05-25 MED ORDER — SUGAMMADEX SODIUM 500 MG/5ML IV SOLN
INTRAVENOUS | Status: DC | PRN
  Administered 2021-05-25: 500 mg via INTRAVENOUS

## 2021-05-25 MED ORDER — OXYCODONE HCL 5 MG/5ML PO SOLN: 5.0000 mg | Freq: Once | ORAL | Status: DC | PRN

## 2021-05-25 MED ORDER — ONDANSETRON HCL 4 MG/2ML IJ SOLN
INTRAMUSCULAR | Status: AC
  Filled 2021-05-25: qty 2

## 2021-05-25 MED ORDER — CEFAZOLIN SODIUM 1 G IJ SOLR
INTRAMUSCULAR | Status: AC
  Filled 2021-05-25: qty 20

## 2021-05-25 MED ORDER — PROPOFOL 10 MG/ML IV BOLUS
INTRAVENOUS | Status: DC | PRN
  Administered 2021-05-25: 200 mg via INTRAVENOUS

## 2021-05-25 MED ORDER — LIDOCAINE 2% (20 MG/ML) 5 ML SYRINGE
INTRAMUSCULAR | Status: DC | PRN
  Administered 2021-05-25: 60 mg via INTRAVENOUS

## 2021-05-25 MED ORDER — KETOROLAC TROMETHAMINE 30 MG/ML IJ SOLN
INTRAMUSCULAR | Status: AC
  Filled 2021-05-25: qty 2

## 2021-05-25 MED ORDER — HYDROMORPHONE HCL 1 MG/ML IJ SOLN: 0.2500 mg | INTRAMUSCULAR | Status: DC | PRN

## 2021-05-25 MED ORDER — PHENYLEPHRINE HCL (PRESSORS) 10 MG/ML IV SOLN
INTRAVENOUS | Status: DC | PRN
  Administered 2021-05-25: 40 ug via INTRAVENOUS

## 2021-05-25 MED ORDER — OXYCODONE HCL 5 MG PO TABS
5.0000 mg | ORAL_TABLET | ORAL | 0 refills | Status: DC | PRN
  Filled 2021-05-25: qty 12, 2d supply, fill #0

## 2021-05-25 MED ORDER — PROPOFOL 10 MG/ML IV BOLUS
INTRAVENOUS | Status: AC
  Filled 2021-05-25: qty 20

## 2021-05-25 MED ORDER — HEMOSTATIC AGENTS (NO CHARGE) OPTIME
TOPICAL | Status: DC | PRN
  Administered 2021-05-25: 1 via TOPICAL

## 2021-05-25 MED ORDER — BUPIVACAINE HCL 0.25 % IJ SOLN
INTRAMUSCULAR | Status: DC | PRN
  Administered 2021-05-25: 30 mL

## 2021-05-25 MED ORDER — FENTANYL CITRATE (PF) 100 MCG/2ML IJ SOLN
INTRAMUSCULAR | Status: DC | PRN
  Administered 2021-05-25 (×2): 50 ug via INTRAVENOUS
  Administered 2021-05-25: 25 ug via INTRAVENOUS
  Administered 2021-05-25: 50 ug via INTRAVENOUS
  Administered 2021-05-25: 25 ug via INTRAVENOUS

## 2021-05-25 MED ORDER — IBUPROFEN 800 MG PO TABS
800.0000 mg | ORAL_TABLET | Freq: Three times a day (TID) | ORAL | 0 refills | Status: DC | PRN
  Filled 2021-05-25: qty 30, 10d supply, fill #0

## 2021-05-25 MED ORDER — KETOROLAC TROMETHAMINE 30 MG/ML IJ SOLN
INTRAMUSCULAR | Status: DC | PRN
  Administered 2021-05-25: 30 mg via INTRAVENOUS

## 2021-05-25 MED ORDER — OXYCODONE HCL 5 MG PO TABS
5.0000 mg | ORAL_TABLET | Freq: Once | ORAL | Status: DC | PRN
Start: 2021-05-25 — End: 2021-05-25

## 2021-05-25 MED ORDER — SODIUM CHLORIDE 0.9 % IR SOLN
Status: DC | PRN
  Administered 2021-05-25 (×2): 3000 mL

## 2021-05-25 MED ORDER — LACTATED RINGERS IV SOLN: INTRAVENOUS | Status: DC

## 2021-05-25 MED ORDER — AMISULPRIDE (ANTIEMETIC) 5 MG/2ML IV SOLN: 10.0000 mg | Freq: Once | INTRAVENOUS | Status: DC | PRN

## 2021-05-25 MED ORDER — LIDOCAINE HCL (PF) 2 % IJ SOLN
INTRAMUSCULAR | Status: AC
  Filled 2021-05-25: qty 5

## 2021-05-25 MED ORDER — ROCURONIUM BROMIDE 100 MG/10ML IV SOLN
INTRAVENOUS | Status: DC | PRN
  Administered 2021-05-25: 100 mg via INTRAVENOUS

## 2021-05-25 MED ORDER — PHENYLEPHRINE 40 MCG/ML (10ML) SYRINGE FOR IV PUSH (FOR BLOOD PRESSURE SUPPORT)
PREFILLED_SYRINGE | INTRAVENOUS | Status: AC
  Filled 2021-05-25: qty 10

## 2021-05-25 MED ORDER — DEXAMETHASONE SODIUM PHOSPHATE 10 MG/ML IJ SOLN
INTRAMUSCULAR | Status: AC
  Filled 2021-05-25: qty 1

## 2021-05-25 MED ORDER — MEPERIDINE HCL 25 MG/ML IJ SOLN: 6.2500 mg | INTRAMUSCULAR | Status: DC | PRN

## 2021-05-25 MED ORDER — MIDAZOLAM HCL 2 MG/2ML IJ SOLN
INTRAMUSCULAR | Status: AC
  Filled 2021-05-25: qty 2

## 2021-05-25 SURGICAL SUPPLY — 40 items
ADH SKN CLS APL DERMABOND .7 (GAUZE/BANDAGES/DRESSINGS) ×2
APL SRG 38 LTWT LNG FL B (MISCELLANEOUS) ×2
APPLICATOR ARISTA FLEXITIP XL (MISCELLANEOUS) ×2 IMPLANT
CANISTER SUCT 1200ML W/VALVE (MISCELLANEOUS) ×4 IMPLANT
CATH ROBINSON RED A/P 16FR (CATHETERS) ×4 IMPLANT
COVER SURGICAL LIGHT HANDLE (MISCELLANEOUS) ×2 IMPLANT
DERMABOND ADVANCED (GAUZE/BANDAGES/DRESSINGS) ×2
DERMABOND ADVANCED .7 DNX12 (GAUZE/BANDAGES/DRESSINGS) IMPLANT
DEVICE MYOSURE LITE (MISCELLANEOUS) IMPLANT
DEVICE MYOSURE REACH (MISCELLANEOUS) ×2 IMPLANT
DILATOR CANAL MILEX (MISCELLANEOUS) IMPLANT
DRSG TELFA 3X8 NADH (GAUZE/BANDAGES/DRESSINGS) ×4 IMPLANT
DURAPREP 26ML APPLICATOR (WOUND CARE) ×2 IMPLANT
GAUZE 4X4 16PLY RFD (DISPOSABLE) ×4 IMPLANT
GLOVE SURG ENC MOIS LTX SZ6.5 (GLOVE) ×4 IMPLANT
GLOVE SURG ENC TEXT LTX SZ6.5 (GLOVE) ×4 IMPLANT
GLOVE SURG UNDER POLY LF SZ7 (GLOVE) ×4 IMPLANT
GOWN STRL REUS W/ TWL LRG LVL3 (GOWN DISPOSABLE) ×4 IMPLANT
GOWN STRL REUS W/TWL LRG LVL3 (GOWN DISPOSABLE) ×8
HEMOSTAT ARISTA ABSORB 3G PWDR (HEMOSTASIS) ×2 IMPLANT
KIT PROCEDURE FLUENT (KITS) ×4 IMPLANT
KIT TURNOVER KIT B (KITS) ×4 IMPLANT
PACK VAGINAL MINOR WOMEN LF (CUSTOM PROCEDURE TRAY) ×4 IMPLANT
PACK WEDGE TRENDGUARD 450 PROC (MISCELLANEOUS) IMPLANT
PAD DRESSING TELFA 3X8 NADH (GAUZE/BANDAGES/DRESSINGS) IMPLANT
PAD OB MATERNITY 4.3X12.25 (PERSONAL CARE ITEMS) ×4 IMPLANT
SEAL ROD LENS SCOPE MYOSURE (ABLATOR) ×4 IMPLANT
SET IRRIG TUBING LAPAROSCOPIC (IRRIGATION / IRRIGATOR) ×2 IMPLANT
SET TUBE SMOKE EVAC HIGH FLOW (TUBING) ×2 IMPLANT
SLEEVE SCD COMPRESS KNEE MED (STOCKING) ×4 IMPLANT
SLEEVE XCEL OPT CAN 5 100 (ENDOMECHANICALS) ×2 IMPLANT
SUT VICRYL 4-0 PS2 18IN ABS (SUTURE) ×2 IMPLANT
TOWEL GREEN STERILE FF (TOWEL DISPOSABLE) ×8 IMPLANT
TRAY FOL W/BAG SLVR 16FR STRL (SET/KITS/TRAYS/PACK) IMPLANT
TRAY FOLEY W/BAG SLVR 16FR LF (SET/KITS/TRAYS/PACK) ×4
TRAY LAPAROSCOPIC (CUSTOM PROCEDURE TRAY) ×2 IMPLANT
TRENDGUARD 450 WEDGE PROC PACK (MISCELLANEOUS) ×4
TROCAR 5M 150ML BLDLS (TROCAR) ×6 IMPLANT
TROCAR OPTI TIP 5M 100M (ENDOMECHANICALS) ×2 IMPLANT
UNDERPAD 30X36 HEAVY ABSORB (UNDERPADS AND DIAPERS) ×4 IMPLANT

## 2021-05-25 NOTE — Op Note (Signed)
Pre Op Dx:   Abnormal uterine bleeding Suspected endometrial polyps on [place]  Post Op Dx:   Abnormal uterine bleeding Endometrial polyps Uterine perforation  Procedure(s):   Hysteroscopy with Dilation and Curettage Myosure Polypectomy Diagnostic Laparoscopy   Surgeon:  Dr. Drema Dallas Assistants:  Dr. Christophe Louis (assistant needed due to complexity of the anatomy) Anesthesia:  General   EBL:  40cc  IVF:  See anesthesia documentation UOP:  See anesthesia documentation Fluid Deficit:  300cc   Drains:  Foley catheter - placed prior to diagnostic laparoscopy Specimen removed:  Endometrial curettings - sent to pathology Device(s) implanted: None Case Type:  Clean-contaminated Findings:  Stage II-III uterine prolapse noted prior to procedure. Sub-centimeter cervical polyp visualized. Significant polypoid tissue on all walls of the uterus which obscured view of tubal ostia. Right tubal ostia visualized after Myosure polypectomy. Uterine perforation confirmed with the presence of a small blood clot superior to the perforation. No active bleeding was noted. There was no free fluid in the pelvis. Uterus mildly enlarged with one ~3-4cm fibroid anteriorly. Evidence of previous bilateral BTL. Both ovaries appeared normal. Complications: None Indications:  60 y.o. G3P3 with AUB and suspected endometrial polyps on [place]. Description of each procedure:  After informed consent was obtained the patient was taken to the operating room in the dorsal supine position.  After administration of general anesthesia, the patient was placed in the dorsal lithotomy position and prepped and draped in the usual sterile fashion.  Her bladder was emptied prior to procedure. A pre-operative time-out was completed.  The anterior lip of the cervix was grasped with a single-tooth tenaculum. The uterus sounded to 12cm. The cervix was serially dilated to accommodate the hysteroscope.  The hysteroscope was advanced and the  findings as above was noted. Myosure Reach was used to resect most of the uterine polyps. There was concern regarding the fluid deficit initially; however, there was at least 1800cc of which traveled up the patient's back and on the floor. The cervix was clamped with two single-tooth tenaculums to prevent fluid from flowing out of the cervix. After the case, the ultimate deficit determined to be 300cc.   A sharp banjo curette was used to curettage the endometrium. It was noted that the curette extended very far into the uterus. The uterus was re-sounded and found to sound to 19cm. Uterine perforation suspected.The single-tooth tenaculum was removed and its sites were made hemostatic with silver nitrate.  Adequate hemostasis was noted.  Decision was made to perform diagnostic laparoscopy.  The patient was re-prepped and draped. A second pre-operative time-out was performed. A foley catheter and sponge stick was placed. The abdomen was entered through a 60mm umbilical incision under direct visualization and a pneumoperitoneum was established.  Right and left lower quadrant ports were placed under visualization.  The entry point was visualized from an inferior port and atraumatic entry confirmed. The findings as above were noted. The uterine perforation was irrigated, suctioned and cauterized with adequate hemostasis noted. The uterus was inspected and no other perforations were noted. Arista powder was placed atop. Adequate hemostasis noted.  The ports were withdrawn under visualization. The pneumoperitoneum was reduced completely and the camera port withdrawn under visualization.  The skin was closed with 4-0 Vicryl in subcuticular fashion. The foley and sponge stick were removed. The patient was returned to dorsal supine position, awakened and extubated in the OR having appeared to tolerate the procedure well.  All sponge, needle, and instrument counts were correct x 2  at the end of the case.    Disposition:   PACU  Drema Dallas, DO

## 2021-05-25 NOTE — Transfer of Care (Signed)
Immediate Anesthesia Transfer of Care Note  Patient: Anna Mann  Procedure(s) Performed: DILATATION & CURETTAGE/HYSTEROSCOPY WITH MYOSURE (Uterus) LAPAROSCOPY DIAGNOSTIC, (Abdomen)  Patient Location: PACU  Anesthesia Type:General  Level of Consciousness: drowsy  Airway & Oxygen Therapy: Patient Spontanous Breathing and Patient connected to face mask oxygen  Post-op Assessment: Report given to RN and Post -op Vital signs reviewed and stable  Post vital signs: Reviewed and stable  Last Vitals:  Vitals Value Taken Time  BP 134/88 05/25/21 1610  Temp    Pulse 73 05/25/21 1613  Resp 17 05/25/21 1613  SpO2 97 % 05/25/21 1613  Vitals shown include unvalidated device data.  Last Pain:  Vitals:   05/25/21 1241  TempSrc: Oral  PainSc: 0-No pain         Complications: No notable events documented.

## 2021-05-25 NOTE — Anesthesia Procedure Notes (Addendum)
Procedure Name: LMA Insertion Date/Time: 05/25/2021 1:51 PM Performed by: Ezequiel Kayser, CRNA Pre-anesthesia Checklist: Patient identified, Emergency Drugs available, Suction available and Patient being monitored Patient Re-evaluated:Patient Re-evaluated prior to induction Oxygen Delivery Method: Circle System Utilized Preoxygenation: Pre-oxygenation with 100% oxygen Induction Type: IV induction Ventilation: Mask ventilation without difficulty LMA: LMA inserted LMA Size: 4.0 Number of attempts: 1 Airway Equipment and Method: Bite block Placement Confirmation: positive ETCO2 Tube secured with: Tape Dental Injury: Teeth and Oropharynx as per pre-operative assessment

## 2021-05-25 NOTE — Anesthesia Preprocedure Evaluation (Signed)
Anesthesia Evaluation  Patient identified by MRN, date of birth, ID band Patient awake    Reviewed: Allergy & Precautions, NPO status , Patient's Chart, lab work & pertinent test results  Airway Mallampati: II  TM Distance: >3 FB Neck ROM: Full    Dental no notable dental hx.    Pulmonary neg pulmonary ROS,    Pulmonary exam normal breath sounds clear to auscultation       Cardiovascular hypertension, Pt. on medications negative cardio ROS Normal cardiovascular exam Rhythm:Regular Rate:Normal     Neuro/Psych negative neurological ROS  negative psych ROS   GI/Hepatic Neg liver ROS, GERD  ,  Endo/Other  diabetesMorbid obesity  Renal/GU negative Renal ROS  negative genitourinary   Musculoskeletal negative musculoskeletal ROS (+)   Abdominal (+) + obese,   Peds negative pediatric ROS (+)  Hematology negative hematology ROS (+)   Anesthesia Other Findings   Reproductive/Obstetrics negative OB ROS                             Anesthesia Physical Anesthesia Plan  ASA: 3  Anesthesia Plan: General   Post-op Pain Management:    Induction: Intravenous  PONV Risk Score and Plan: 3 and Ondansetron, Dexamethasone, Midazolam and Treatment may vary due to age or medical condition  Airway Management Planned: LMA  Additional Equipment:   Intra-op Plan:   Post-operative Plan: Extubation in OR  Informed Consent: I have reviewed the patients History and Physical, chart, labs and discussed the procedure including the risks, benefits and alternatives for the proposed anesthesia with the patient or authorized representative who has indicated his/her understanding and acceptance.     Dental advisory given  Plan Discussed with: CRNA  Anesthesia Plan Comments:         Anesthesia Quick Evaluation

## 2021-05-25 NOTE — Interval H&P Note (Signed)
History and Physical Interval Note:  05/25/2021 1:14 PM  Anna Mann  has presented today for surgery, with the diagnosis of Abnormal Uterine Bleeding (N93.9).  The various methods of treatment have been discussed with the patient and family. After consideration of risks, benefits and other options for treatment, the patient has consented to  Procedure(s): [place] (N/A) as a surgical intervention.  The patient's history has been reviewed, patient examined, no change in status, stable for surgery.  I have reviewed the patient's chart and labs.  Questions were answered to the patient's satisfaction.     Drema Dallas

## 2021-05-25 NOTE — Anesthesia Procedure Notes (Signed)
Procedure Name: Intubation Date/Time: 05/25/2021 2:36 PM Performed by: Ezequiel Kayser, CRNA Pre-anesthesia Checklist: Patient identified, Emergency Drugs available, Suction available and Patient being monitored Patient Re-evaluated:Patient Re-evaluated prior to induction Oxygen Delivery Method: Circle System Utilized Preoxygenation: Pre-oxygenation with 100% oxygen Induction Type: IV induction Ventilation: Mask ventilation without difficulty Laryngoscope Size: Mac and 3 Grade View: Grade I Tube type: Oral Number of attempts: 1 Airway Equipment and Method: Stylet and Oral airway Placement Confirmation: ETT inserted through vocal cords under direct vision, positive ETCO2 and breath sounds checked- equal and bilateral Secured at: 21 cm Tube secured with: Tape Dental Injury: Teeth and Oropharynx as per pre-operative assessment

## 2021-05-26 ENCOUNTER — Other Ambulatory Visit (HOSPITAL_COMMUNITY): Payer: Self-pay

## 2021-05-26 ENCOUNTER — Encounter (HOSPITAL_BASED_OUTPATIENT_CLINIC_OR_DEPARTMENT_OTHER): Payer: Self-pay | Admitting: Obstetrics and Gynecology

## 2021-05-26 MED ORDER — JARDIANCE 10 MG PO TABS
10.0000 mg | ORAL_TABLET | Freq: Every day | ORAL | 0 refills | Status: DC
  Filled 2021-05-26: qty 90, 90d supply, fill #0

## 2021-05-27 ENCOUNTER — Other Ambulatory Visit (HOSPITAL_COMMUNITY): Payer: Self-pay

## 2021-05-27 LAB — SURGICAL PATHOLOGY

## 2021-05-29 ENCOUNTER — Ambulatory Visit (HOSPITAL_COMMUNITY)
Admission: EM | Admit: 2021-05-29 | Discharge: 2021-05-29 | Disposition: A | Discharge status: Home / Self Care | Payer: No Typology Code available for payment source | Attending: Physician Assistant | Admitting: Physician Assistant

## 2021-05-29 ENCOUNTER — Encounter (HOSPITAL_COMMUNITY): Payer: Self-pay

## 2021-05-29 DIAGNOSIS — Z5189 Encounter for other specified aftercare: Secondary | ICD-10-CM | POA: Diagnosis present

## 2021-05-29 DIAGNOSIS — L03012 Cellulitis of left finger: Secondary | ICD-10-CM | POA: Insufficient documentation

## 2021-05-29 LAB — CBC
HCT: 40.3 % (ref 36.0–46.0)
Hemoglobin: 13 g/dL (ref 12.0–15.0)
MCH: 29.2 pg (ref 26.0–34.0)
MCHC: 32.3 g/dL (ref 30.0–36.0)
MCV: 90.6 fL (ref 80.0–100.0)
Platelets: 254 10*3/uL (ref 150–400)
RBC: 4.45 MIL/uL (ref 3.87–5.11)
RDW: 12.8 % (ref 11.5–15.5)
WBC: 8.2 10*3/uL (ref 4.0–10.5)
nRBC: 0 % (ref 0.0–0.2)

## 2021-05-29 MED ORDER — DOXYCYCLINE HYCLATE 100 MG PO CAPS: 100.0000 mg | ORAL_CAPSULE | Freq: Two times a day (BID) | ORAL | 0 refills | Status: DC

## 2021-05-29 MED ORDER — MUPIROCIN 2 % EX OINT: 1.0000 "application " | TOPICAL_OINTMENT | Freq: Two times a day (BID) | CUTANEOUS | 0 refills | Status: DC

## 2021-05-29 NOTE — Anesthesia Postprocedure Evaluation (Signed)
Anesthesia Post Note  Patient: Anna Mann  Procedure(s) Performed: DILATATION & CURETTAGE/HYSTEROSCOPY WITH MYOSURE (Uterus) LAPAROSCOPY DIAGNOSTIC, (Abdomen)     Patient location during evaluation: PACU Anesthesia Type: General Level of consciousness: awake and alert Pain management: pain level controlled Vital Signs Assessment: post-procedure vital signs reviewed and stable Respiratory status: spontaneous breathing, nonlabored ventilation, respiratory function stable and patient connected to nasal cannula oxygen Cardiovascular status: blood pressure returned to baseline and stable Postop Assessment: no apparent nausea or vomiting Anesthetic complications: no   No notable events documented.  Last Vitals:  Vitals:   05/25/21 1715 05/25/21 1813  BP: (!) 160/77 (!) 147/80  Pulse: 73 92  Resp: 18 18  Temp:  36.8 C  SpO2: 98% 96%    Last Pain:  Vitals:   05/27/21 1048  TempSrc:   PainSc: 5                  Friedrich Harriott

## 2021-05-29 NOTE — Discharge Instructions (Addendum)
Take doxycycline 100 mg twice daily for 10 days to cover for infection.  He should avoid being in the sun while on this medication.  Use ointment with dressing changes twice daily.  If you have any spread of redness, change in skin color, increased swelling, increased pain you need to be reevaluated immediately.  Follow-up with OB/GYN as scheduled.  We will contact you if your hemoglobin is low.  If you have any worsening symptoms please return for reevaluation.

## 2021-05-29 NOTE — ED Provider Notes (Signed)
[place]    CSN: [id number] Arrival date & time: 05/29/21  1007      History   Chief Complaint Chief Complaint  Patient presents with   Finger infection   Wound Check    HPI Anna Mann is a 60 y.o. female.   Patient presents today with a several day history of worsening swelling of her left middle finger.  Reports that she had a hangnail that she pulled and soon after that she developed the swelling.  She has now noticed a pocket of purulent fluid that she has been unable to drain.  She reports pain is rated 10 on a 0-10 pain scale, localized to affected area without radiation, described as throbbing, worse with palpation, no leaving factors identified.  She has tried over-the-counter analgesics without pain relief.  She has a history of diabetes and reports her blood sugar is not well controlled.  She denies history of MRSA or recurrent skin infections.  Denies any recent antibiotic use.  In addition, patient is requesting we look at recent surgical scars.  Reports that she had hysteroscopy but ended up having to have laparotomy due to complications.  She is requesting we look at surgical wounds to ensure they are healing appropriately.  She denies any significant pain, bleeding, drainage.  Reports overall she is feeling well and has not had any significant bruising or evidence of blood loss.   Past Medical History:  Diagnosis Date   Abnormal Pap smear    colposcopy   Abnormal uterine bleeding (AUB)    Diabetes mellitus without complication (HCC)    GERD (gastroesophageal reflux disease)    Heart murmur    Hypertension    Vertigo     Patient Active Problem List   Diagnosis Date Noted   Uncontrolled type 2 diabetes mellitus with hyperglycemia ([place]) 03/11/2021   Postmenopausal bleeding 12/18/2019   Fibroids 12/18/2019    Past Surgical History:  Procedure Laterality Date   COLPOSCOPY     DILATATION & CURETTAGE/HYSTEROSCOPY WITH MYOSURE N/A 05/25/2021    Procedure: DILATATION & CURETTAGE/HYSTEROSCOPY WITH MYOSURE;  Surgeon: Drema Dallas, DO;  Location: [facility];  Service: Gynecology;  Laterality: N/A;   DILATION AND CURETTAGE OF UTERUS     KNEE SURGERY     LAPAROSCOPY N/A 05/25/2021   Procedure: LAPAROSCOPY DIAGNOSTIC,;  Surgeon: Drema Dallas, DO;  Location: [facility];  Service: Gynecology;  Laterality: N/A;   TUBAL LIGATION      OB History     Gravida  3   Para  3   Term  3   Preterm      AB      Living  3      SAB      IAB      Ectopic      Multiple      Live Births               Home Medications    Prior to Admission medications   Medication Sig Start Date End Date Taking? Authorizing Provider  doxycycline (VIBRAMYCIN) 100 MG capsule Take 1 capsule (100 mg total) by mouth 2 (two) times daily. 05/29/21  Yes Keyaira Clapham, Junie Panning K, PA-C  mupirocin ointment (BACTROBAN) 2 % Apply 1 application topically 2 (two) times daily. 05/29/21  Yes Bram Hottel K, PA-C  atorvastatin (LIPITOR) 20 MG tablet Take 1 tablet (20 mg total) by mouth daily. 04/28/21     empagliflozin (JARDIANCE) 10 MG TABS tablet  Take 1 tablet (10 mg total) by mouth daily. 05/26/21     glucose blood (TRUE METRIX BLOOD GLUCOSE TEST) test strip Use as instructed. Check blood glucose level by fingerstick twice per day.  E11.65 11/22/19   Gildardo Pounds, NP  ibuprofen (ADVIL) 800 MG tablet TAKE 1 TABLET BY MOUTH EVERY 6 HOURS AS NEEDED FOR PAIN 10/05/20 10/05/21  Feliberto Harts R  ibuprofen (ADVIL) 800 MG tablet Take 1 tablet (800 mg total) by mouth every 8 (eight) hours as needed. 05/25/21   Drema Dallas, DO  lisinopril (ZESTRIL) 10 MG tablet TAKE 1 TABLET BY MOUTH ONCE A DAY FOR KIDNEY PROTECTION 12/21/20 12/21/21  Redmon, Barth Kirks, PA  megestrol (MEGACE) 40 MG tablet Take 1/2 tablet by mouth once daily; can increase to 2 tabs 2 times a day in event of heavy bleeding 04/18/21     omeprazole (PRILOSEC) 20 MG capsule Take 1 capsule  (20 mg total) by mouth 2 (two) times daily before a meal. 02/12/20 05/25/21  Gildardo Pounds, NP  oxyCODONE (ROXICODONE) 5 MG immediate release tablet Take 1 tablet (5 mg total) by mouth every 4 (four) hours as needed for severe pain. 05/25/21   Drema Dallas, DO  Semaglutide (RYBELSUS) 3 MG TABS Take 3 mg by mouth.    [provider]  Semaglutide (RYBELSUS) 3 MG TABS Take 1 tablet (3 mg) by mouth daily as directed. 03/28/21     Semaglutide (RYBELSUS) 3 MG TABS Take 1 tablet by mouth daily as directed 04/28/21     Semaglutide 3 MG TABS TAKE 1 TABLET BY MOUTH ONCE DAILY AS DIRECTED 03/01/21 03/01/22  Redmon, Barth Kirks, PA  Semaglutide 3 MG TABS TAKE 1 AS DIRECTED 1 TABLET BY MOUTH ONCE A DAY 01/28/21 01/28/22  Redmon, Barth Kirks, PA  Semaglutide 3 MG TABS TAKE BY MOUTH AS DIRECTED ONCE DAILY 12/24/20 12/24/21  Redmon, Barth Kirks, PA  TRUEplus Lancets 28G MISC Use as instructed. Check blood glucose level by fingerstick twice per day. 11/22/19   Gildardo Pounds, NP  fluticasone (FLONASE) 50 MCG/ACT nasal spray Place 1 spray into both nostrils daily. Patient not taking: Reported on 03/18/2020 01/25/20 12/15/20  Gildardo Pounds, NP    Family History Family History  Problem Relation Age of Onset   Heart disease Paternal Grandmother    Hypertension Paternal Grandmother    Diabetes Maternal Grandmother    Hypertension Maternal Grandmother    Cancer Father        colon   Breast cancer Mother    Heart disease Mother    Diabetes Mother    Hypertension Mother    Diabetes Sister    Diabetes Brother    Hodgkin's lymphoma Sister    Heart disease Brother    Diabetes Brother     Social History Social History   Tobacco Use   Smoking status: Never   Smokeless tobacco: Never  Vaping Use   Vaping Use: Never used  Substance Use Topics   Alcohol use: No   Drug use: No     Allergies   Iodine, Ivp dye [iodinated diagnostic agents], and Shellfish allergy   Review of Systems Review of Systems   Constitutional:  Negative for activity change, appetite change, fatigue and fever.  Respiratory:  Negative for cough and shortness of breath.   Cardiovascular:  Negative for chest pain.  Gastrointestinal:  Negative for abdominal pain, diarrhea, nausea and vomiting.  Musculoskeletal:  Negative for arthralgias and myalgias.  Skin:  Positive for wound. Negative for color  change.  Neurological:  Negative for dizziness, weakness, light-headedness and headaches.    Physical Exam Triage Vital Signs ED Triage Vitals [05/29/21 1049]  Enc Vitals Group     BP (!) 144/90     Pulse Rate 76     Resp 19     Temp 99.4 F (37.4 C)     Temp Source Oral     SpO2 99 %     Weight      Height      Head Circumference      Peak Flow      Pain Score      Pain Loc      Pain Edu?      Excl. in [place]?    No data found.  Updated Vital Signs BP (!) 144/90 (BP Location: Right Arm)   Pulse 76   Temp 99.4 F (37.4 C) (Oral)   Resp 19   LMP  (LMP Unknown) Comment: irregular  SpO2 99%   Visual Acuity Right Eye Distance:   Left Eye Distance:   Bilateral Distance:    Right Eye Near:   Left Eye Near:    Bilateral Near:     Physical Exam Vitals reviewed.  Constitutional:      General: She is awake. She is not in acute distress.    Appearance: Normal appearance. She is normal weight. She is not ill-appearing.     Comments: Very pleasant female appears stated age in no acute distress sitting comfortably on exam table  HENT:     Head: Normocephalic and atraumatic.  Cardiovascular:     Rate and Rhythm: Normal rate and regular rhythm.     Heart sounds: Normal heart sounds, S1 normal and S2 normal. No murmur heard. Pulmonary:     Effort: Pulmonary effort is normal.     Breath sounds: Normal breath sounds. No wheezing, rhonchi or rales.     Comments: Clear to auscultation bilaterally Abdominal:     General: Bowel sounds are normal.     Palpations: Abdomen is soft.     Tenderness: There is no  abdominal tenderness. There is no right CVA tenderness, left CVA tenderness, guarding or rebound.  Skin:    Findings: Abscess and wound present. No erythema.     Comments: Left middle finger: Paronychia noted at lateral and proximal nail bed.  Area is edematous with purulent drainage noted.  No streaking or evidence of lymphangitis.  Hand neurovascularly intact.  Patient has 3 surgical wounds noted at umbilicus and inferior abdomen bilaterally.  Area is covered with Dermabond with no evidence of dehiscence.  No erythema or drainage noted.  Wounds appear to be healing appropriately.  Psychiatric:        Behavior: Behavior is cooperative.     UC Treatments / Results  Labs (all labs ordered are listed, but only abnormal results are displayed) Labs Reviewed  CBC    EKG   Radiology No results found.  Procedures Procedures (including critical care time)  Medications Ordered in UC Medications - No data to display  Initial Impression / Assessment and Plan / UC Course  I have reviewed the triage vital signs and the nursing notes.  Pertinent labs & imaging results that were available during my care of the patient were reviewed by me and considered in my medical decision making (see chart for details).      Paronychia drained in clinic today.  Patient was started on doxycycline twice daily to cover for infection particularly  given patient is diabetic.  She was encouraged to soak affected area and salt water several times per day.  She can use antibiotic ointment at nailbed to help with healing.  Discussed alarm symptoms that warrant emergent evaluation.  Strict return precautions given to which patient expressed understanding.  Surgical wounds appear to be healing appropriately with no evidence of infection or dehiscence.  Given patient had bleeding as part of a complication from recent surgery we will check CBC today to ensure hemoglobin is not trending down.  Discussed alarm symptoms  that warrant emergent evaluation.  Strict return precautions given to which patient expressed understanding.  Final Clinical Impressions(s) / UC Diagnoses   Final diagnoses:  Paronychia, finger, left  Visit for wound check     Discharge Instructions      Take doxycycline 100 mg twice daily for 10 days to cover for infection.  He should avoid being in the sun while on this medication.  Use ointment with dressing changes twice daily.  If you have any spread of redness, change in skin color, increased swelling, increased pain you need to be reevaluated immediately.  Follow-up with OB/GYN as scheduled.  We will contact you if your hemoglobin is low.  If you have any worsening symptoms please return for reevaluation.     ED Prescriptions     Medication Sig Dispense Auth. Provider   doxycycline (VIBRAMYCIN) 100 MG capsule Take 1 capsule (100 mg total) by mouth 2 (two) times daily. 20 capsule Eilyn Polack K, PA-C   mupirocin ointment (BACTROBAN) 2 % Apply 1 application topically 2 (two) times daily. 22 g Kamylah Manzo K, PA-C      PDMP not reviewed this encounter.   Terrilee Croak, PA-C 05/29/21 1139

## 2021-05-29 NOTE — ED Triage Notes (Signed)
Pt in with c/o finger infection that occurred after she pulled a hang nail  Pt also had hysteroscopy procedure on Wednesday and would like her incisions checked  States she has been feeling sick and her navel is sore

## 2021-06-01 ENCOUNTER — Other Ambulatory Visit (HOSPITAL_COMMUNITY): Payer: Self-pay

## 2021-06-20 ENCOUNTER — Other Ambulatory Visit (HOSPITAL_COMMUNITY): Payer: Self-pay

## 2021-06-20 MED ORDER — RYBELSUS 7 MG PO TABS
ORAL_TABLET | Freq: Every day | ORAL | 1 refills | Status: DC
  Filled 2021-06-20: qty 90, 90d supply, fill #0
  Filled 2021-08-29: qty 90, 90d supply, fill #1

## 2021-06-20 MED ORDER — JARDIANCE 10 MG PO TABS
10.0000 mg | ORAL_TABLET | Freq: Every day | ORAL | 3 refills | Status: DC
  Filled 2021-06-20 – 2021-09-14 (×2): qty 30, 30d supply, fill #0

## 2021-06-24 ENCOUNTER — Other Ambulatory Visit (HOSPITAL_COMMUNITY): Payer: Self-pay

## 2021-06-24 MED ORDER — CARESTART COVID-19 HOME TEST VI KIT
PACK | 0 refills | Status: DC
  Filled 2021-06-24: qty 4, 4d supply, fill #0

## 2021-07-28 ENCOUNTER — Other Ambulatory Visit (HOSPITAL_COMMUNITY): Payer: Self-pay

## 2021-08-30 ENCOUNTER — Other Ambulatory Visit (HOSPITAL_COMMUNITY): Payer: Self-pay

## 2021-08-31 ENCOUNTER — Other Ambulatory Visit (HOSPITAL_COMMUNITY): Payer: Self-pay

## 2021-09-01 ENCOUNTER — Other Ambulatory Visit (HOSPITAL_COMMUNITY): Payer: Self-pay

## 2021-09-02 ENCOUNTER — Other Ambulatory Visit (HOSPITAL_COMMUNITY): Payer: Self-pay

## 2021-09-11 MED FILL — Lisinopril Tab 10 MG: ORAL | 90 days supply | Qty: 90 | Fill #1 | Status: AC

## 2021-09-12 ENCOUNTER — Other Ambulatory Visit (HOSPITAL_COMMUNITY): Payer: Self-pay

## 2021-09-14 ENCOUNTER — Other Ambulatory Visit (HOSPITAL_COMMUNITY): Payer: Self-pay

## 2021-09-16 ENCOUNTER — Other Ambulatory Visit (HOSPITAL_COMMUNITY): Payer: Self-pay

## 2021-09-16 MED ORDER — JARDIANCE 25 MG PO TABS
25.0000 mg | ORAL_TABLET | Freq: Every day | ORAL | 3 refills | Status: DC
  Filled 2021-09-16: qty 30, 30d supply, fill #0
  Filled 2022-02-07: qty 30, 30d supply, fill #1
  Filled 2022-03-07: qty 30, 30d supply, fill #2
  Filled 2022-03-22: qty 60, 60d supply, fill #2

## 2021-12-11 HISTORY — PX: COLONOSCOPY: SHX174

## 2021-12-11 HISTORY — PX: UPPER GI ENDOSCOPY: SHX6162

## 2022-01-03 ENCOUNTER — Other Ambulatory Visit: Payer: Self-pay

## 2022-01-03 ENCOUNTER — Other Ambulatory Visit (HOSPITAL_COMMUNITY): Payer: Self-pay

## 2022-01-03 ENCOUNTER — Ambulatory Visit (INDEPENDENT_AMBULATORY_CARE_PROVIDER_SITE_OTHER): Payer: No Typology Code available for payment source | Admitting: Emergency Medicine

## 2022-01-03 ENCOUNTER — Encounter: Payer: Self-pay | Admitting: Emergency Medicine

## 2022-01-03 VITALS — BP 124/64 | HR 80 | Ht 63.0 in | Wt 244.0 lb

## 2022-01-03 DIAGNOSIS — I10 Essential (primary) hypertension: Secondary | ICD-10-CM | POA: Diagnosis not present

## 2022-01-03 DIAGNOSIS — E785 Hyperlipidemia, unspecified: Secondary | ICD-10-CM | POA: Diagnosis not present

## 2022-01-03 DIAGNOSIS — I152 Hypertension secondary to endocrine disorders: Secondary | ICD-10-CM

## 2022-01-03 DIAGNOSIS — E1159 Type 2 diabetes mellitus with other circulatory complications: Secondary | ICD-10-CM | POA: Diagnosis not present

## 2022-01-03 DIAGNOSIS — E1169 Type 2 diabetes mellitus with other specified complication: Secondary | ICD-10-CM

## 2022-01-03 DIAGNOSIS — Z7689 Persons encountering health services in other specified circumstances: Secondary | ICD-10-CM

## 2022-01-03 DIAGNOSIS — E1165 Type 2 diabetes mellitus with hyperglycemia: Secondary | ICD-10-CM | POA: Diagnosis not present

## 2022-01-03 LAB — COMPREHENSIVE METABOLIC PANEL
ALT: 11 U/L (ref 0–35)
AST: 13 U/L (ref 0–37)
Albumin: 4.1 g/dL (ref 3.5–5.2)
Alkaline Phosphatase: 54 U/L (ref 39–117)
BUN: 9 mg/dL (ref 6–23)
CO2: 27 mEq/L (ref 19–32)
Calcium: 9.5 mg/dL (ref 8.4–10.5)
Chloride: 110 mEq/L (ref 96–112)
Creatinine, Ser: 0.76 mg/dL (ref 0.40–1.20)
GFR: 85.01 mL/min (ref >60.00)
Glucose, Bld: 130 mg/dL — ABNORMAL HIGH (ref 70–99)
Potassium: 3.7 mEq/L (ref 3.5–5.1)
Sodium: 144 mEq/L (ref 135–145)
Total Bilirubin: 0.4 mg/dL (ref 0.2–1.2)
Total Protein: 6.9 g/dL (ref 6.0–8.3)

## 2022-01-03 LAB — LIPID PANEL
Cholesterol: 176 mg/dL (ref 0–200)
HDL: 29.7 mg/dL — ABNORMAL LOW (ref >39.00)
LDL Cholesterol: 130 mg/dL — ABNORMAL HIGH (ref 0–99)
NonHDL: 146.05
Total CHOL/HDL Ratio: 6
Triglycerides: 78 mg/dL (ref 0.0–149.0)
VLDL: 15.6 mg/dL (ref 0.0–40.0)

## 2022-01-03 LAB — POCT GLYCOSYLATED HEMOGLOBIN (HGB A1C): Hemoglobin A1C: 6.8 % — AB (ref 4.0–5.6)

## 2022-01-03 MED ORDER — RYBELSUS 7 MG PO TABS
7.0000 mg | ORAL_TABLET | Freq: Every day | ORAL | 3 refills | Status: DC
  Filled 2022-01-03: qty 90, 90d supply, fill #0
  Filled 2022-03-08: qty 90, 90d supply, fill #1
  Filled 2022-05-09: qty 90, 90d supply, fill #2

## 2022-01-03 MED ORDER — ATORVASTATIN CALCIUM 20 MG PO TABS
20.0000 mg | ORAL_TABLET | Freq: Every day | ORAL | 3 refills | Status: DC
  Filled 2022-01-03: qty 90, 90d supply, fill #0
  Filled 2022-05-03: qty 90, 90d supply, fill #1
  Filled 2022-05-05: qty 30, 30d supply, fill #1

## 2022-01-03 NOTE — Assessment & Plan Note (Signed)
Well-controlled hypertension.  Continue lisinopril 10 mg daily Well-controlled diabetes with hemoglobin A1c of 6.8. Continue Jardiance and Rybelsus. Diet and nutrition discussed. Follow-up in 3 months.

## 2022-01-03 NOTE — Patient Instructions (Signed)

## 2022-01-03 NOTE — Progress Notes (Signed)
Anna Mann 61 y.o.   Chief Complaint  Patient presents with   New Patient (Initial Visit)    Manage diabetes and BP. Pt would like to discuss vertigo   Medication Refill    Lipitor, rybelsus, jaridance    HISTORY OF PRESENT ILLNESS: This is a 61 y.o. female first visit to this office, here to establish care with me. Has history of hypertension and diabetes.  Needs medication refills. #1 diabetes, on Rybelsus and Jardiance #2 hypertension: On lisinopril #3 dyslipidemia: On atorvastatin BP Readings from Last 3 Encounters:  05/29/21 (!) 144/90  05/25/21 (!) 147/80  12/15/20 (!) 159/85   Lab Results  Component Value Date   HGBA1C 6.2 (H) 02/16/2020     Medication Refill Pertinent negatives include no abdominal pain, chest pain, chills, congestion, coughing, fever, headaches, nausea, rash, sore throat or vomiting.    Prior to Admission medications   Medication Sig Start Date End Date Taking? Authorizing Provider  atorvastatin (LIPITOR) 20 MG tablet Take 1 tablet (20 mg total) by mouth daily. 04/28/21  Yes   empagliflozin (JARDIANCE) 10 MG TABS tablet Take 1 tablet (10 mg total) by mouth daily. 06/20/21  Yes   empagliflozin (JARDIANCE) 25 MG TABS tablet Take 1 tablet (25 mg total) by mouth daily. 09/16/21  Yes   glucose blood (TRUE METRIX BLOOD GLUCOSE TEST) test strip Use as instructed. Check blood glucose level by fingerstick twice per day.  E11.65 11/22/19  Yes Gildardo Pounds, NP  ibuprofen (ADVIL) 800 MG tablet Take 1 tablet (800 mg total) by mouth every 8 (eight) hours as needed. 05/25/21  Yes Delora Fuel, Melissa, DO  lisinopril (ZESTRIL) 10 MG tablet TAKE 1 TABLET BY MOUTH ONCE A DAY FOR KIDNEY PROTECTION 12/21/20 01/03/22 Yes Redmon, Noelle, PA  megestrol (MEGACE) 40 MG tablet Take 1/2 tablet by mouth once daily; can increase to 2 tabs 2 times a day in event of heavy bleeding 04/18/21  Yes   omeprazole (PRILOSEC) 20 MG capsule Take 1 capsule (20 mg total) by mouth 2 (two) times  daily before a meal. 02/12/20 01/03/22 Yes Gildardo Pounds, NP  Semaglutide (RYBELSUS) 7 MG TABS Take 1 tablet by mouth once a day as directed 06/20/21  Yes   TRUEplus Lancets 28G MISC Use as instructed. Check blood glucose level by fingerstick twice per day. 11/22/19  Yes Gildardo Pounds, NP  COVID-19 At Home Antigen Test [facility] COVID-19 HOME TEST) KIT Use as directed. Patient not taking: Reported on 01/03/2022 06/24/21   Jefm Bryant, RPH  fluticasone [facility]) 50 MCG/ACT nasal spray Place 1 spray into both nostrils daily. Patient not taking: Reported on 03/18/2020 01/25/20 12/15/20  Gildardo Pounds, NP    Allergies  Allergen Reactions   Iodine    Ivp Dye [Iodinated Contrast Media]    Shellfish Allergy     Patient Active Problem List   Diagnosis Date Noted   Uncontrolled type 2 diabetes mellitus with hyperglycemia ([place]) 03/11/2021   Postmenopausal bleeding 12/18/2019   Fibroids 12/18/2019    Past Medical History:  Diagnosis Date   Abnormal Pap smear    colposcopy   Abnormal uterine bleeding (AUB)    Diabetes mellitus without complication (HCC)    GERD (gastroesophageal reflux disease)    Heart murmur    Hypertension    Vertigo     Past Surgical History:  Procedure Laterality Date   COLPOSCOPY     DILATATION & CURETTAGE/HYSTEROSCOPY WITH MYOSURE N/A 05/25/2021   Procedure: DILATATION & CURETTAGE/HYSTEROSCOPY  WITH MYOSURE;  Surgeon: Drema Dallas, DO;  Location: [place];  Service: Gynecology;  Laterality: N/A;   DILATION AND CURETTAGE OF UTERUS     KNEE SURGERY     LAPAROSCOPY N/A 05/25/2021   Procedure: LAPAROSCOPY DIAGNOSTIC,;  Surgeon: Drema Dallas, DO;  Location: [place];  Service: Gynecology;  Laterality: N/A;   TUBAL LIGATION      Social History   Socioeconomic History   Marital status: Divorced    Spouse name: Not on file   Number of children: Not on file   Years of education: Not on file   Highest education level: Not  on file  Occupational History   Not on file  Tobacco Use   Smoking status: Never   Smokeless tobacco: Never  Vaping Use   Vaping Use: Never used  Substance and Sexual Activity   Alcohol use: No   Drug use: No   Sexual activity: Not Currently    Birth control/protection: Post-menopausal  Other Topics Concern   Not on file  Social History Narrative   Not on file   Social Determinants of Health   Financial Resource Strain: Not on file  Food Insecurity: Not on file  Transportation Needs: Not on file  Physical Activity: Not on file  Stress: Not on file  Social Connections: Not on file  Intimate Partner Violence: Not on file    Family History  Problem Relation Age of Onset   Heart disease Paternal Grandmother    Hypertension Paternal Grandmother    Diabetes Maternal Grandmother    Hypertension Maternal Grandmother    Cancer Father        colon   Breast cancer Mother    Heart disease Mother    Diabetes Mother    Hypertension Mother    Diabetes Sister    Diabetes Brother    Hodgkin's lymphoma Sister    Heart disease Brother    Diabetes Brother      Review of Systems  Constitutional: Negative.  Negative for chills and fever.  HENT: Negative.  Negative for congestion and sore throat.   Respiratory: Negative.  Negative for cough and shortness of breath.   Cardiovascular: Negative.  Negative for chest pain and palpitations.  Gastrointestinal:  Negative for abdominal pain, diarrhea, nausea and vomiting.  Genitourinary: Negative.   Skin: Negative.  Negative for rash.  Neurological:  Positive for dizziness (Occasional and intermittent). Negative for headaches.  All other systems reviewed and are negative.  Wt Readings from Last 3 Encounters:  05/25/21 257 lb 15 oz (117 kg)  03/18/20 267 lb 1.6 oz (121.2 kg)  11/19/19 270 lb 9.6 oz (122.7 kg)    Physical Exam Vitals reviewed.  Constitutional:      Appearance: Normal appearance. She is obese.  HENT:     Head:  Normocephalic.  Eyes:     Extraocular Movements: Extraocular movements intact.     Pupils: Pupils are equal, round, and reactive to light.  Cardiovascular:     Rate and Rhythm: Normal rate and regular rhythm.     Pulses: Normal pulses.     Heart sounds: Normal heart sounds.  Pulmonary:     Effort: Pulmonary effort is normal.     Breath sounds: Normal breath sounds.  Abdominal:     Palpations: Abdomen is soft.     Tenderness: There is no abdominal tenderness.  Musculoskeletal:        General: Normal range of motion.     Cervical  back: No tenderness.     Right lower leg: No edema.     Left lower leg: No edema.  Lymphadenopathy:     Cervical: No cervical adenopathy.  Skin:    General: Skin is warm and dry.     Capillary Refill: Capillary refill takes less than 2 seconds.  Neurological:     General: No focal deficit present.     Mental Status: She is alert and oriented to person, place, and time.  Psychiatric:        Mood and Affect: Mood normal.        Behavior: Behavior normal.   Results for orders placed or performed in visit on 01/03/22 (from the past 24 hour(s))  POCT glycosylated hemoglobin (Hb A1C)     Status: Abnormal   Collection Time: 01/03/22  1:18 PM  Result Value Ref Range   Hemoglobin A1C 6.8 (A) 4.0 - 5.6 %   HbA1c POC (<> result, manual entry)     HbA1c, POC (prediabetic range)     HbA1c, POC (controlled diabetic range)       ASSESSMENT & PLAN: A total of 53 minutes was spent with the patient and counseling/coordination of care regarding preparing for this visit, establishing care with me, review of most recent office visit notes, review of all medications, review of most recent blood work results including today's hemoglobin A1c, education on nutrition, review of past medical history, comprehensive history and physical examination, health maintenance items, cardiovascular risks associated with diabetes and hypertension, prognosis, documentation and need for  follow-up.  Problem List Items Addressed This Visit       Cardiovascular and Mediastinum   Hypertension associated with diabetes ([place]) - Primary    Well-controlled hypertension.  Continue lisinopril 10 mg daily Well-controlled diabetes with hemoglobin A1c of 6.8. Continue Jardiance and Rybelsus. Diet and nutrition discussed. Follow-up in 3 months.      Relevant Medications   atorvastatin (LIPITOR) 20 MG tablet   Semaglutide (RYBELSUS) 7 MG TABS     Endocrine   Dyslipidemia associated with type 2 diabetes mellitus ([place])    Stable.  Continue atorvastatin 40 mg daily.       Relevant Medications   atorvastatin (LIPITOR) 20 MG tablet   Semaglutide (RYBELSUS) 7 MG TABS   Other Visit Diagnoses     Type 2 diabetes mellitus with hyperglycemia, without long-term current use of insulin (HCC)       Relevant Medications   atorvastatin (LIPITOR) 20 MG tablet   Semaglutide (RYBELSUS) 7 MG TABS   Other Relevant Orders   Comprehensive metabolic panel   Lipid panel   POCT glycosylated hemoglobin (Hb A1C) (Completed)   Essential hypertension       Relevant Medications   atorvastatin (LIPITOR) 20 MG tablet   Dyslipidemia       Relevant Medications   atorvastatin (LIPITOR) 20 MG tablet   Encounter to establish care          Patient Instructions  Diabetes Mellitus and Nutrition, Adult When you have diabetes, or diabetes mellitus, it is very important to have healthy eating habits because your blood sugar (glucose) levels are greatly affected by what you eat and drink. Eating healthy foods in the right amounts, at about the same times every day, can help you: Manage your blood glucose. Lower your risk of heart disease. Improve your blood pressure. Reach or maintain a healthy weight. What can affect my meal plan? Every person with diabetes is different, and each person has  different needs for a meal plan. Your health care provider may recommend that you work with a dietitian to make a  meal plan that is best for you. Your meal plan may vary depending on factors such as: The calories you need. The medicines you take. Your weight. Your blood glucose, blood pressure, and cholesterol levels. Your activity level. Other health conditions you have, such as heart or kidney disease. How do carbohydrates affect me? Carbohydrates, also called carbs, affect your blood glucose level more than any other type of food. Eating carbs raises the amount of glucose in your blood. It is important to know how many carbs you can safely have in each meal. This is different for every person. Your dietitian can help you calculate how many carbs you should have at each meal and for each snack. How does alcohol affect me? Alcohol can cause a decrease in blood glucose (hypoglycemia), especially if you use insulin or take certain diabetes medicines by mouth. Hypoglycemia can be a life-threatening condition. Symptoms of hypoglycemia, such as sleepiness, dizziness, and confusion, are similar to symptoms of having too much alcohol. Do not drink alcohol if: Your health care provider tells you not to drink. You are pregnant, may be pregnant, or are planning to become pregnant. If you drink alcohol: Limit how much you have to: 0-1 drink a day for women. 0-2 drinks a day for men. Know how much alcohol is in your drink. In the [place] one drink equals one 12 oz bottle of beer (355 mL), one 5 oz glass of wine (148 mL), or one 1 oz glass of hard liquor (44 mL). Keep yourself hydrated with water, diet soda, or unsweetened iced tea. Keep in mind that regular soda, juice, and other mixers may contain a lot of sugar and must be counted as carbs. What are tips for following this plan? Reading food labels Start by checking the serving size on the Nutrition Facts label of packaged foods and drinks. The number of calories and the amount of carbs, fats, and other nutrients listed on the label are based on one serving of the  item. Many items contain more than one serving per package. Check the total grams (g) of carbs in one serving. Check the number of grams of saturated fats and trans fats in one serving. Choose foods that have a low amount or none of these fats. Check the number of milligrams (mg) of salt (sodium) in one serving. Most people should limit total sodium intake to less than 2,300 mg per day. Always check the nutrition information of foods labeled as "low-fat" or "nonfat." These foods may be higher in added sugar or refined carbs and should be avoided. Talk to your dietitian to identify your daily goals for nutrients listed on the label. Shopping Avoid buying canned, pre-made, or processed foods. These foods tend to be high in fat, sodium, and added sugar. Shop around the outside edge of the grocery store. This is where you will most often find fresh fruits and vegetables, bulk grains, fresh meats, and fresh dairy products. Cooking Use low-heat cooking methods, such as baking, instead of high-heat cooking methods, such as deep frying. Cook using healthy oils, such as olive, canola, or sunflower oil. Avoid cooking with butter, cream, or high-fat meats. Meal planning Eat meals and snacks regularly, preferably at the same times every day. Avoid going long periods of time without eating. Eat foods that are high in fiber, such as fresh fruits, vegetables, beans, and whole  grains. Eat 4-6 oz (112-168 g) of lean protein each day, such as lean meat, chicken, fish, eggs, or tofu. One ounce (oz) (28 g) of lean protein is equal to: 1 oz (28 g) of meat, chicken, or fish. 1 egg.  cup (62 g) of tofu. Eat some foods each day that contain healthy fats, such as avocado, nuts, seeds, and fish. What foods should I eat? Fruits Berries. Apples. Oranges. Peaches. Apricots. Plums. Grapes. Mangoes. Papayas. Pomegranates. Kiwi. Cherries. Vegetables Leafy greens, including lettuce, spinach, kale, chard, collard greens,  mustard greens, and cabbage. Beets. Cauliflower. Broccoli. Carrots. Green beans. Tomatoes. Peppers. Onions. Cucumbers. Brussels sprouts. Grains Whole grains, such as whole-wheat or whole-grain bread, crackers, tortillas, cereal, and pasta. Unsweetened oatmeal. Quinoa. Brown or wild rice. Meats and other proteins Seafood. Poultry without skin. Lean cuts of poultry and beef. Tofu. Nuts. Seeds. Dairy Low-fat or fat-free dairy products such as milk, yogurt, and cheese. The items listed above may not be a complete list of foods and beverages you can eat and drink. Contact a dietitian for more information. What foods should I avoid? Fruits Fruits canned with syrup. Vegetables Canned vegetables. Frozen vegetables with butter or cream sauce. Grains Refined white flour and flour products such as bread, pasta, snack foods, and cereals. Avoid all processed foods. Meats and other proteins Fatty cuts of meat. Poultry with skin. Breaded or fried meats. Processed meat. Avoid saturated fats. Dairy Full-fat yogurt, cheese, or milk. Beverages Sweetened drinks, such as soda or iced tea. The items listed above may not be a complete list of foods and beverages you should avoid. Contact a dietitian for more information. Questions to ask a health care provider Do I need to meet with a certified diabetes care and education specialist? Do I need to meet with a dietitian? What number can I call if I have questions? When are the best times to check my blood glucose? Where to find more information: American Diabetes Association: diabetes.org Academy of Nutrition and Dietetics: eatright.[employer] of Diabetes and Digestive and Kidney Diseases: [employer]is Association of Diabetes Care & Education Specialists: diabeteseducator.org Summary It is important to have healthy eating habits because your blood sugar (glucose) levels are greatly affected by what you eat and drink. It is important to use  alcohol carefully. A healthy meal plan will help you manage your blood glucose and lower your risk of heart disease. Your health care provider may recommend that you work with a dietitian to make a meal plan that is best for you. This information is not intended to replace advice given to you by your health care provider. Make sure you discuss any questions you have with your health care provider. Document Revised: 06/30/2020 Document Reviewed: 06/30/2020 Elsevier Patient Education  2022 [employer], MD [facility]

## 2022-01-03 NOTE — Assessment & Plan Note (Signed)
Stable.  Continue atorvastatin 40 mg daily. ° °

## 2022-01-13 ENCOUNTER — Other Ambulatory Visit (HOSPITAL_COMMUNITY): Payer: Self-pay

## 2022-01-13 ENCOUNTER — Other Ambulatory Visit: Payer: Self-pay | Admitting: Emergency Medicine

## 2022-01-13 MED ORDER — LISINOPRIL 10 MG PO TABS
10.0000 mg | ORAL_TABLET | Freq: Every day | ORAL | 3 refills | Status: DC
  Filled 2022-01-13: qty 90, 90d supply, fill #0
  Filled 2022-05-03: qty 90, 90d supply, fill #1
  Filled 2022-05-09: qty 90, 90d supply, fill #2

## 2022-02-07 ENCOUNTER — Encounter: Payer: Self-pay | Admitting: Emergency Medicine

## 2022-02-07 DIAGNOSIS — R110 Nausea: Secondary | ICD-10-CM

## 2022-02-07 DIAGNOSIS — R197 Diarrhea, unspecified: Secondary | ICD-10-CM

## 2022-02-07 NOTE — Telephone Encounter (Signed)
Patient is having stomach issues and would like to be referred to GI. Please advise if an appt is needed or a referral can be placed

## 2022-02-07 NOTE — Telephone Encounter (Signed)
GI referral is warranted.  Thanks.

## 2022-02-07 NOTE — Telephone Encounter (Signed)
Referral to [facility] entered. Patient notified via mychart

## 2022-02-08 ENCOUNTER — Other Ambulatory Visit (HOSPITAL_COMMUNITY): Payer: Self-pay

## 2022-02-08 NOTE — Telephone Encounter (Signed)
Called patient to verify. Patient has [employer] and a referral is not needed. Patient will call the schedule that appt with GYN. ?

## 2022-02-08 NOTE — Telephone Encounter (Signed)
Please place referral to [facility] GYN as requested.  Thanks.

## 2022-02-08 NOTE — Telephone Encounter (Signed)
Patient is requesting a referral to a [facility] office for vaginal bleeding after a procedure in 2022 . Please advise  ?

## 2022-02-16 ENCOUNTER — Encounter: Payer: Self-pay | Admitting: Gastroenterology

## 2022-03-07 ENCOUNTER — Other Ambulatory Visit (HOSPITAL_COMMUNITY): Payer: Self-pay

## 2022-03-08 ENCOUNTER — Encounter: Payer: Self-pay | Admitting: Gastroenterology

## 2022-03-08 ENCOUNTER — Other Ambulatory Visit (HOSPITAL_COMMUNITY): Payer: Self-pay

## 2022-03-08 ENCOUNTER — Ambulatory Visit (INDEPENDENT_AMBULATORY_CARE_PROVIDER_SITE_OTHER): Payer: No Typology Code available for payment source | Admitting: Gastroenterology

## 2022-03-08 VITALS — BP 136/72 | HR 82 | Ht 63.0 in | Wt 244.6 lb

## 2022-03-08 DIAGNOSIS — R194 Change in bowel habit: Secondary | ICD-10-CM | POA: Diagnosis not present

## 2022-03-08 DIAGNOSIS — Z800 Family history of malignant neoplasm of digestive organs: Secondary | ICD-10-CM

## 2022-03-08 DIAGNOSIS — R112 Nausea with vomiting, unspecified: Secondary | ICD-10-CM | POA: Diagnosis not present

## 2022-03-08 MED ORDER — SUTAB 1479-225-188 MG PO TABS: 1.0000 | ORAL_TABLET | Freq: Once | ORAL | 0 refills | Status: AC

## 2022-03-08 MED ORDER — LOPERAMIDE HCL 2 MG PO TABS: 2.0000 mg | ORAL_TABLET | ORAL | 0 refills | Status: DC | PRN

## 2022-03-08 MED ORDER — ONDANSETRON 4 MG PO TBDP
4.0000 mg | ORAL_TABLET | Freq: Three times a day (TID) | ORAL | 1 refills | Status: AC | PRN
  Filled 2022-03-08: qty 30, 10d supply, fill #0
  Filled 2023-02-21: qty 30, 10d supply, fill #1

## 2022-03-08 NOTE — Patient Instructions (Addendum)
If you are age 61 or older, your body mass index should be between 23-30. Your Body mass index is 43.33 kg/m?Marland Kitchen If this is out of the aforementioned range listed, please consider follow up with your Primary Care Provider. ? ?If you are age 46 or younger, your body mass index should be between 19-25. Your Body mass index is 43.33 kg/m?Marland Kitchen If this is out of the aformentioned range listed, please consider follow up with your Primary Care Provider.  ? ?________________________________________________________ ? ?The [place] [facility] providers would like to encourage you to use [facility] to communicate with providers for non-urgent requests or questions.  Due to long hold times on the telephone, sending your provider a message by [facility] may be a faster and more efficient way to get a response.  Please allow 48 business hours for a response.  Please remember that this is for non-urgent requests.  ?_______________________________________________________ ? ?You have been scheduled for a EGD/colonoscopy. Please follow written instructions given to you at your visit today.  ?Please pick up your prep supplies at the pharmacy within the next 1-3 days. ?If you use inhalers (even only as needed), please bring them with you on the day of your procedure. ? ?PLEASE HOLD YOUR ORAL DIABETIC MEDICATIONS THE MORNING OF THE PROCEDURE. ? ?We have sent the following medications to your pharmacy for you to pick up at your convenience: ?Zofran 4 mg ODT: dissolve 1 tablet under your tongue every 8 hours as needed ? ?Please purchase the following medications over the counter and take as directed: ?Imodium ? ?Hold Rybelsus. ? ?Thank you for entrusting me with your care and for choosing [employer], ?Dr. Gahanna Cellar ? ? ? ? ? ?

## 2022-03-08 NOTE — Progress Notes (Addendum)
? ?HPI :  ?61 year old female with a history of diabetes, GERD, hypertension, uterine bleeding due to fibroids, referred here by Horald Pollen MD for altered bowel habits, nausea and vomiting. ? ?She states her symptoms date back to January when she ate at [employer] 1 night.  She thought she developed food poisoning when she developed acute onset nausea vomiting with severe diarrhea.  The diarrhea lasted for a few days and then resolved on her own and she felt back to normal baseline.  The following weekend she states she had another acute episode of nausea vomiting diarrhea that again lasted a few days and then resolved.  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.  There was a norovirus outbreak there at 1 point time and she was not sure if she got it.  However over time she has had multiple episodes now where she has acute onset nausea vomiting diarrhea which lasts for a few days and then stops.  In between these episodes she feels normal without any problems.  She denies any problems eating in between episodes.  No nausea or vomiting.  No dysphagia.  She has a history of rare reflux but does not really take much for it.  She does use omeprazole as needed over years but uses it only seldomly.  In between episodes her bowels are regular.  She denies any blood in her stool.  She does have cramping abdominal pains with the diarrhea but again those do not bother her when she is feeling well.  She has missed several days of work due to these episodes and she is not sure what is causing it. ? ?She states she has been seeing gynecology for uterine polyps and is on hormonal therapy to reduce her menstrual cycles. ? ?She has been on Jardiance for her diabetes since last April and she has been on Rybelsus for diabetes and weight loss as well.  She has been on Rybelsus since February of last year.  States she does have some nausea when she takes this but that has been stable, she is not had any dosing changes  around the symptoms.  She has lost a few pounds over the past few months in light of these episodes. ? ?She has never had a colonoscopy or endoscopy before.  Her father did have colon cancer diagnosed in his 53s.  Her maternal aunt also had colon cancer.  She denies any cardiopulmonary symptoms at this point time.  Denies any history of heart disease. No NSAID use ? ? ?She had a CT scan of her abdomen and pelvis in September 2020 which did not show any acute pathology. ? ? ?Of note, the patient relays a very stressful event that occurred at her workplace several months ago and endorses ongoing stress in the workplace from it. She wonders if this could be related to her symptoms or put her at risk for ulcers in her stomach.  ? ?Past Medical History:  ?Diagnosis Date  ? Abnormal Pap smear   ? colposcopy  ? Abnormal uterine bleeding (AUB)   ? Diabetes mellitus without complication ([place])   ? GERD (gastroesophageal reflux disease)   ? Heart murmur   ? Hypertension   ? Vertigo   ? ? ? ?Past Surgical History:  ?Procedure Laterality Date  ? COLPOSCOPY    ? DILATATION & CURETTAGE/HYSTEROSCOPY WITH MYOSURE N/A 05/25/2021  ? Procedure: DILATATION & CURETTAGE/HYSTEROSCOPY WITH MYOSURE;  Surgeon: Drema Dallas, DO;  Location: [facility];  Service: Gynecology;  Laterality: N/A;  ? DILATION AND CURETTAGE OF UTERUS    ? KNEE ARTHROSCOPY W/ ACL RECONSTRUCTION Left   ? LAPAROSCOPY N/A 05/25/2021  ? Procedure: LAPAROSCOPY DIAGNOSTIC,;  Surgeon: Drema Dallas, DO;  Location: [facility];  Service: Gynecology;  Laterality: N/A;  ? TUBAL LIGATION    ? ?Family History  ?Problem Relation Age of Onset  ? Aneurysm Mother   ? Heart disease Mother   ? Diabetes Mother   ? Hypertension Mother   ? Colon cancer Father   ?     colon  ? Diabetes Sister   ? Kidney failure Sister   ? Heart failure Sister   ? Hodgkin's lymphoma Sister   ? Diabetes Brother   ? Heart disease Brother   ? Congestive Heart Failure Brother    ? Heart disease Brother   ? Diabetes Brother   ? Pancreatitis Brother   ? Heart failure Brother   ? Diabetes Maternal Grandmother   ? Hypertension Maternal Grandmother   ? Congestive Heart Failure Maternal Grandmother   ? Heart disease Paternal Grandmother   ? Hypertension Paternal Grandmother   ? Stroke Paternal Grandfather   ? Ovarian cancer Maternal Aunt   ?     2021  ? Colon cancer Maternal Aunt   ? Esophageal cancer Paternal Aunt   ? Hodgkin's lymphoma Niece   ? Diabetes Daughter   ? Other Daughter   ?     pre diabetic  ? Kidney failure Nephew   ? Diabetes Nephew   ? Breast cancer Cousin   ? Colon polyps Neg Hx   ? Stomach cancer Neg Hx   ? ?Social History  ? ?Tobacco Use  ? Smoking status: Never  ? Smokeless tobacco: Never  ?Vaping Use  ? Vaping Use: Never used  ?Substance Use Topics  ? Alcohol use: No  ? Drug use: No  ? ?Current Outpatient Medications  ?Medication Sig Dispense Refill  ? atorvastatin (LIPITOR) 20 MG tablet Take 1 tablet (20 mg total) by mouth daily. 30 tablet 3  ? empagliflozin (JARDIANCE) 25 MG TABS tablet Take 1 tablet (25 mg total) by mouth daily. 30 tablet 3  ? glucose blood (TRUE METRIX BLOOD GLUCOSE TEST) test strip Use as instructed. Check blood glucose level by fingerstick twice per day.  E11.65 100 each 12  ? ibuprofen (ADVIL) 800 MG tablet Take 1 tablet (800 mg total) by mouth every 8 (eight) hours as needed. 30 tablet 0  ? lisinopril (ZESTRIL) 10 MG tablet Take 1 tablet (10 mg total) by mouth daily. 90 tablet 3  ? megestrol (MEGACE) 40 MG tablet Take 1/2 tablet by mouth once daily; can increase to 2 tabs 2 times a day in event of heavy bleeding 90 tablet 3  ? omeprazole (PRILOSEC) 20 MG capsule Take 1 capsule (20 mg total) by mouth 2 (two) times daily before a meal. (Patient taking differently: Take 20 mg by mouth as needed.) 180 capsule 1  ? Semaglutide (RYBELSUS) 7 MG TABS Take 1 tablet (7 mg) by mouth daily as directed. 90 tablet 3  ? TRUEplus Lancets 28G MISC Use as instructed.  Check blood glucose level by fingerstick twice per day. 100 each 3  ? ?No current facility-administered medications for this visit.  ? ?Allergies  ?Allergen Reactions  ? Iodine   ? Ivp Dye [Iodinated Contrast Media]   ? Shellfish Allergy   ? ? ? ?Review of Systems: ?All systems reviewed and negative except  where noted in HPI.  ? ?Lab Results  ?Component Value Date  ? WBC 8.2 05/29/2021  ? HGB 13.0 05/29/2021  ? HCT 40.3 05/29/2021  ? MCV 90.6 05/29/2021  ? PLT 254 05/29/2021  ? ? ?Lab Results  ?Component Value Date  ? CREATININE 0.76 01/03/2022  ? BUN 9 01/03/2022  ? NA 144 01/03/2022  ? K 3.7 01/03/2022  ? CL 110 01/03/2022  ? CO2 27 01/03/2022  ? ? ?Lab Results  ?Component Value Date  ? ALT 11 01/03/2022  ? AST 13 01/03/2022  ? ALKPHOS 54 01/03/2022  ? BILITOT 0.4 01/03/2022  ? ? ? ?Physical Exam: ?BP 136/72   Pulse 82   Ht '5\' 3"'$  (1.6 m)   Wt 244 lb 9.6 oz (110.9 kg)   LMP  (LMP Unknown) Comment: irregular  SpO2 98%   BMI 43.33 kg/m?  ?Constitutional: Pleasant,well-developed, female in no acute distress. ?HEENT: Normocephalic and atraumatic. Conjunctivae are normal. No scleral icterus. ?Neck supple.  ?Cardiovascular: Normal rate, regular rhythm.  ?Pulmonary/chest: Effort normal and breath sounds normal. No wheezing, rales or rhonchi. ?Abdominal: Soft, nondistended, nontender. . There are no masses palpable.  ?Extremities: no edema ?Lymphadenopathy: No cervical adenopathy noted. ?Neurological: Alert and oriented to person place and time. ?Skin: Skin is warm and dry. No rashes noted. ?Psychiatric: Normal mood and affect. Behavior is normal. ? ? ?ASSESSMENT AND PLAN: ?61 year old female here for new patient assessment the following: ? ?Nausea and vomiting ?Altered bowel habits ?Family history of colon cancer ? ?History as outlined above.  At baseline patient has been on Rybelsus for several months which causes baseline nausea, however now with intermittent episodes of severe nausea vomiting diarrhea which last  for a few days at a time and then in between episodes is fine without symptoms.  Initially she thought she had an infectious gastroenteritis however with numerous episodes since January that seems unlikely.

## 2022-03-09 ENCOUNTER — Encounter: Payer: Self-pay | Admitting: Gastroenterology

## 2022-03-14 ENCOUNTER — Other Ambulatory Visit (HOSPITAL_COMMUNITY): Payer: Self-pay

## 2022-03-15 ENCOUNTER — Other Ambulatory Visit (HOSPITAL_COMMUNITY): Payer: Self-pay

## 2022-03-16 ENCOUNTER — Other Ambulatory Visit (HOSPITAL_COMMUNITY): Payer: Self-pay

## 2022-03-22 ENCOUNTER — Other Ambulatory Visit (HOSPITAL_COMMUNITY): Payer: Self-pay

## 2022-04-03 ENCOUNTER — Encounter: Payer: Self-pay | Admitting: Emergency Medicine

## 2022-04-03 ENCOUNTER — Ambulatory Visit (INDEPENDENT_AMBULATORY_CARE_PROVIDER_SITE_OTHER): Payer: No Typology Code available for payment source | Admitting: Emergency Medicine

## 2022-04-03 VITALS — BP 120/68 | HR 72 | Temp 98.2°F | Ht 63.0 in | Wt 244.4 lb

## 2022-04-03 DIAGNOSIS — I152 Hypertension secondary to endocrine disorders: Secondary | ICD-10-CM

## 2022-04-03 DIAGNOSIS — E1159 Type 2 diabetes mellitus with other circulatory complications: Secondary | ICD-10-CM | POA: Diagnosis not present

## 2022-04-03 DIAGNOSIS — E1169 Type 2 diabetes mellitus with other specified complication: Secondary | ICD-10-CM | POA: Diagnosis not present

## 2022-04-03 DIAGNOSIS — Z1211 Encounter for screening for malignant neoplasm of colon: Secondary | ICD-10-CM | POA: Diagnosis not present

## 2022-04-03 DIAGNOSIS — E785 Hyperlipidemia, unspecified: Secondary | ICD-10-CM

## 2022-04-03 LAB — POCT GLYCOSYLATED HEMOGLOBIN (HGB A1C): Hemoglobin A1C: 6.8 % — AB (ref 4.0–5.6)

## 2022-04-03 NOTE — Progress Notes (Signed)
Anna Mann ?61 y.o. ? ? ?Chief Complaint  ?Patient presents with  ? Diabetes  ? Referral  ?  Referral for colonoscopy   ? ? ?HISTORY OF PRESENT ILLNESS: ?This is a 61 y.o. female with history of diabetes and hypertension here for follow-up. ?Has been off Rybelsus for 1 month. ?Patient saw a GI doctor for upper intestinal issues most likely related to stress situation at work. ?Scheduled for upper endoscopy May 23.  Patient was advised to stop Rybelsus due to possible GI side effects. ?Patient thinks GI symptoms are most likely stress-induced. ?Still taking Jardiance 25 mg daily. ?No other complaints or medical concerns today. ? ?Diabetes ?Pertinent negatives for hypoglycemia include no dizziness or headaches. Pertinent negatives for diabetes include no chest pain.  ? ? ?Prior to Admission medications   ?Medication Sig Start Date End Date Taking? Authorizing Provider  ?atorvastatin (LIPITOR) 20 MG tablet Take 1 tablet (20 mg total) by mouth daily. 01/03/22  Yes Cletis Clack, Ines Bloomer, MD  ?empagliflozin (JARDIANCE) 25 MG TABS tablet Take 1 tablet (25 mg total) by mouth daily. 09/16/21  Yes   ?glucose blood (TRUE METRIX BLOOD GLUCOSE TEST) test strip Use as instructed. Check blood glucose level by fingerstick twice per day.  E11.65 11/22/19  Yes Gildardo Pounds, NP  ?ibuprofen (ADVIL) 800 MG tablet Take 1 tablet (800 mg total) by mouth every 8 (eight) hours as needed. 05/25/21  Yes Drema Dallas, DO  ?lisinopril (ZESTRIL) 10 MG tablet Take 1 tablet (10 mg total) by mouth daily. 01/13/22  Yes Shuntia Exton, Ines Bloomer, MD  ?loperamide (IMODIUM A-D) 2 MG tablet Take 1 tablet (2 mg total) by mouth as needed for diarrhea or loose stools. 03/08/22  Yes Armbruster, Carlota Raspberry, MD  ?megestrol (MEGACE) 40 MG tablet Take 1/2 tablet by mouth once daily; can increase to 2 tabs 2 times a day in event of heavy bleeding 04/18/21  Yes   ?ondansetron (ZOFRAN-ODT) 4 MG disintegrating tablet Dissolve 1 tablet (4 mg total) by mouth every 8  (eight) hours as needed for nausea or vomiting. 03/08/22  Yes Armbruster, Carlota Raspberry, MD  ?TRUEplus Lancets 28G MISC Use as instructed. Check blood glucose level by fingerstick twice per day. 11/22/19  Yes Gildardo Pounds, NP  ?omeprazole (PRILOSEC) 20 MG capsule Take 1 capsule (20 mg total) by mouth 2 (two) times daily before a meal. ?Patient taking differently: Take 20 mg by mouth as needed. 02/12/20 03/08/22  Gildardo Pounds, NP  ?Semaglutide (RYBELSUS) 7 MG TABS Take 1 tablet (7 mg) by mouth daily as directed. ?Patient not taking: Reported on 04/03/2022 01/03/22   Horald Pollen, MD  ?fluticasone [facility]) 50 MCG/ACT nasal spray Place 1 spray into both nostrils daily. ?Patient not taking: Reported on 03/18/2020 01/25/20 12/15/20  Gildardo Pounds, NP  ? ? ?Allergies  ?Allergen Reactions  ? Iodine   ? Ivp Dye [Iodinated Contrast Media]   ? Shellfish Allergy   ? ? ?Patient Active Problem List  ? Diagnosis Date Noted  ? Hypertension associated with diabetes ([place]) 01/03/2022  ? Dyslipidemia associated with type 2 diabetes mellitus ([place]) 01/03/2022  ? Uncontrolled type 2 diabetes mellitus with hyperglycemia ([place]) 03/11/2021  ? Postmenopausal bleeding 12/18/2019  ? Fibroids 12/18/2019  ? ? ?Past Medical History:  ?Diagnosis Date  ? Abnormal Pap smear   ? colposcopy  ? Abnormal uterine bleeding (AUB)   ? Diabetes mellitus without complication ([place])   ? GERD (gastroesophageal reflux disease)   ? Heart murmur   ?  Hypertension   ? Vertigo   ? ? ?Past Surgical History:  ?Procedure Laterality Date  ? COLPOSCOPY    ? DILATATION & CURETTAGE/HYSTEROSCOPY WITH MYOSURE N/A 05/25/2021  ? Procedure: DILATATION & CURETTAGE/HYSTEROSCOPY WITH MYOSURE;  Surgeon: Drema Dallas, DO;  Location: [facility];  Service: Gynecology;  Laterality: N/A;  ? DILATION AND CURETTAGE OF UTERUS    ? KNEE ARTHROSCOPY W/ ACL RECONSTRUCTION Left   ? LAPAROSCOPY N/A 05/25/2021  ? Procedure: LAPAROSCOPY DIAGNOSTIC,;  Surgeon: Drema Dallas,  DO;  Location: [facility];  Service: Gynecology;  Laterality: N/A;  ? TUBAL LIGATION    ? ? ?Social History  ? ?Socioeconomic History  ? Marital status: Divorced  ?  Spouse name: Not on file  ? Number of children: 3  ? Years of education: Not on file  ? Highest education level: Not on file  ?Occupational History  ? Occupation: Engineer, petroleum rep  ?Tobacco Use  ? Smoking status: Never  ? Smokeless tobacco: Never  ?Vaping Use  ? Vaping Use: Never used  ?Substance and Sexual Activity  ? Alcohol use: No  ? Drug use: No  ? Sexual activity: Not Currently  ?  Birth control/protection: Post-menopausal  ?Other Topics Concern  ? Not on file  ?Social History Narrative  ? Not on file  ? ?Social Determinants of Health  ? ?Financial Resource Strain: Not on file  ?Food Insecurity: Not on file  ?Transportation Needs: Not on file  ?Physical Activity: Not on file  ?Stress: Not on file  ?Social Connections: Not on file  ?Intimate Partner Violence: Not on file  ? ? ?Family History  ?Problem Relation Age of Onset  ? Aneurysm Mother   ? Heart disease Mother   ? Diabetes Mother   ? Hypertension Mother   ? Colon cancer Father   ?     colon  ? Diabetes Sister   ? Kidney failure Sister   ? Heart failure Sister   ? Hodgkin's lymphoma Sister   ? Diabetes Brother   ? Heart disease Brother   ? Congestive Heart Failure Brother   ? Heart disease Brother   ? Diabetes Brother   ? Pancreatitis Brother   ? Heart failure Brother   ? Diabetes Maternal Grandmother   ? Hypertension Maternal Grandmother   ? Congestive Heart Failure Maternal Grandmother   ? Heart disease Paternal Grandmother   ? Hypertension Paternal Grandmother   ? Stroke Paternal Grandfather   ? Ovarian cancer Maternal Aunt   ?     2021  ? Colon cancer Maternal Aunt   ? Esophageal cancer Paternal Aunt   ? Hodgkin's lymphoma Niece   ? Diabetes Daughter   ? Other Daughter   ?     pre diabetic  ? Kidney failure Nephew   ? Diabetes Nephew   ? Breast cancer Cousin   ? Colon polyps  Neg Hx   ? Stomach cancer Neg Hx   ? ? ? ?Review of Systems  ?Constitutional: Negative.  Negative for chills and fever.  ?HENT: Negative.  Negative for congestion and sore throat.   ?Respiratory: Negative.  Negative for cough and shortness of breath.   ?Cardiovascular: Negative.  Negative for chest pain and palpitations.  ?Gastrointestinal:  Negative for abdominal pain, nausea and vomiting.  ?Genitourinary: Negative.  Negative for dysuria and hematuria.  ?Skin: Negative.  Negative for rash.  ?Neurological: Negative.  Negative for dizziness and headaches.  ?All other systems reviewed and are negative. ?  Today's Vitals  ? 04/03/22 1019  ?BP: 120/68  ?Pulse: 72  ?Temp: 98.2 ?F (36.8 ?C)  ?SpO2: 98%  ?Weight: 244 lb 6 oz (110.8 kg)  ?Height: '5\' 3"'$  (1.6 m)  ? ?Body mass index is 43.29 kg/m?. ?Wt Readings from Last 3 Encounters:  ?04/03/22 244 lb 6 oz (110.8 kg)  ?03/08/22 244 lb 9.6 oz (110.9 kg)  ?01/03/22 244 lb (110.7 kg)  ? ? ? ?Physical Exam ?Vitals reviewed.  ?Constitutional:   ?   Appearance: Normal appearance. She is obese.  ?HENT:  ?   Head: Normocephalic.  ?Eyes:  ?   Extraocular Movements: Extraocular movements intact.  ?   Conjunctiva/sclera: Conjunctivae normal.  ?   Pupils: Pupils are equal, round, and reactive to light.  ?Cardiovascular:  ?   Rate and Rhythm: Normal rate and regular rhythm.  ?   Pulses: Normal pulses.  ?   Heart sounds: Normal heart sounds.  ?Pulmonary:  ?   Effort: Pulmonary effort is normal.  ?   Breath sounds: Normal breath sounds.  ?Abdominal:  ?   Palpations: Abdomen is soft.  ?   Tenderness: There is no abdominal tenderness.  ?Musculoskeletal:     ?   General: Normal range of motion.  ?   Cervical back: No tenderness.  ?   Right lower leg: No edema.  ?   Left lower leg: No edema.  ?Lymphadenopathy:  ?   Cervical: No cervical adenopathy.  ?Skin: ?   General: Skin is warm and dry.  ?   Capillary Refill: Capillary refill takes less than 2 seconds.  ?Neurological:  ?   General: No focal  deficit present.  ?   Mental Status: She is alert and oriented to person, place, and time.  ?Psychiatric:     ?   Mood and Affect: Mood normal.     ?   Behavior: Behavior normal.  ? ? ? ?ASSESSMENT & PLAN:

## 2022-04-03 NOTE — Assessment & Plan Note (Signed)
Well-controlled hypertension.  Continue lisinopril 10 mg daily ?BP Readings from Last 3 Encounters:  ?04/03/22 120/68  ?03/08/22 136/72  ?01/03/22 124/64  ?Well-controlled diabetes with hemoglobin A 1C at 6.8 ?Continue Jardiance 25 mg daily ?Recommend to restart Rybelsus 7 mg daily. ?Diet and nutrition discussed. ?Follow-up in 3 months. ? ?

## 2022-04-03 NOTE — Assessment & Plan Note (Signed)
Stable.  Continue atorvastatin 20 mg daily. ?The 10-year ASCVD risk score (Arnett DK, et al., 2019) is: 16.4% ?  Values used to calculate the score: ?    Age: 61 years ?    Sex: Female ?    Is Non-Hispanic African American: Yes ?    Diabetic: Yes ?    Tobacco smoker: No ?    Systolic Blood Pressure: 403 mmHg ?    Is BP treated: Yes ?    HDL Cholesterol: 29.7 mg/dL ?    Total Cholesterol: 176 mg/dL ? ?

## 2022-04-03 NOTE — Patient Instructions (Signed)
Continue Jardiance 25 mg daily ?Restart Rybelsus 7 mg daily. ?Continue lisinopril and atorvastatin. ? ?Diabetes Mellitus and Nutrition, Adult ?When you have diabetes, or diabetes mellitus, it is very important to have healthy eating habits because your blood sugar (glucose) levels are greatly affected by what you eat and drink. Eating healthy foods in the right amounts, at about the same times every day, can help you: ?Manage your blood glucose. ?Lower your risk of heart disease. ?Improve your blood pressure. ?Reach or maintain a healthy weight. ?What can affect my meal plan? ?Every person with diabetes is different, and each person has different needs for a meal plan. Your health care provider may recommend that you work with a dietitian to make a meal plan that is best for you. Your meal plan may vary depending on factors such as: ?The calories you need. ?The medicines you take. ?Your weight. ?Your blood glucose, blood pressure, and cholesterol levels. ?Your activity level. ?Other health conditions you have, such as heart or kidney disease. ?How do carbohydrates affect me? ?Carbohydrates, also called carbs, affect your blood glucose level more than any other type of food. Eating carbs raises the amount of glucose in your blood. ?It is important to know how many carbs you can safely have in each meal. This is different for every person. Your dietitian can help you calculate how many carbs you should have at each meal and for each snack. ?How does alcohol affect me? ?Alcohol can cause a decrease in blood glucose (hypoglycemia), especially if you use insulin or take certain diabetes medicines by mouth. Hypoglycemia can be a life-threatening condition. Symptoms of hypoglycemia, such as sleepiness, dizziness, and confusion, are similar to symptoms of having too much alcohol. ?Do not drink alcohol if: ?Your health care provider tells you not to drink. ?You are pregnant, may be pregnant, or are planning to become  pregnant. ?If you drink alcohol: ?Limit how much you have to: ?0-1 drink a day for women. ?0-2 drinks a day for men. ?Know how much alcohol is in your drink. In the [place] one drink equals one 12 oz bottle of beer (355 mL), one 5 oz glass of wine (148 mL), or one 1? oz glass of hard liquor (44 mL). ?Keep yourself hydrated with water, diet soda, or unsweetened iced tea. Keep in mind that regular soda, juice, and other mixers may contain a lot of sugar and must be counted as carbs. ?What are tips for following this plan? ? ?Reading food labels ?Start by checking the serving size on the Nutrition Facts label of packaged foods and drinks. The number of calories and the amount of carbs, fats, and other nutrients listed on the label are based on one serving of the item. Many items contain more than one serving per package. ?Check the total grams (g) of carbs in one serving. ?Check the number of grams of saturated fats and trans fats in one serving. Choose foods that have a low amount or none of these fats. ?Check the number of milligrams (mg) of salt (sodium) in one serving. Most people should limit total sodium intake to less than 2,300 mg per day. ?Always check the nutrition information of foods labeled as "low-fat" or "nonfat." These foods may be higher in added sugar or refined carbs and should be avoided. ?Talk to your dietitian to identify your daily goals for nutrients listed on the label. ?Shopping ?Avoid buying canned, pre-made, or processed foods. These foods tend to be high in fat, sodium, and  added sugar. ?Shop around the outside edge of the grocery store. This is where you will most often find fresh fruits and vegetables, bulk grains, fresh meats, and fresh dairy products. ?Cooking ?Use low-heat cooking methods, such as baking, instead of high-heat cooking methods, such as deep frying. ?Cook using healthy oils, such as olive, canola, or sunflower oil. ?Avoid cooking with butter, cream, or high-fat meats. ?Meal  planning ?Eat meals and snacks regularly, preferably at the same times every day. Avoid going long periods of time without eating. ?Eat foods that are high in fiber, such as fresh fruits, vegetables, beans, and whole grains. ?Eat 4-6 oz (112-168 g) of lean protein each day, such as lean meat, chicken, fish, eggs, or tofu. One ounce (oz) (28 g) of lean protein is equal to: ?1 oz (28 g) of meat, chicken, or fish. ?1 egg. ?? cup (62 g) of tofu. ?Eat some foods each day that contain healthy fats, such as avocado, nuts, seeds, and fish. ?What foods should I eat? ?Fruits ?Berries. Apples. Oranges. Peaches. Apricots. Plums. Grapes. Mangoes. Papayas. Pomegranates. Kiwi. Cherries. ?Vegetables ?Leafy greens, including lettuce, spinach, kale, chard, collard greens, mustard greens, and cabbage. Beets. Cauliflower. Broccoli. Carrots. Green beans. Tomatoes. Peppers. Onions. Cucumbers. Brussels sprouts. ?Grains ?Whole grains, such as whole-wheat or whole-grain bread, crackers, tortillas, cereal, and pasta. Unsweetened oatmeal. Quinoa. Brown or wild rice. ?Meats and other proteins ?Seafood. Poultry without skin. Lean cuts of poultry and beef. Tofu. Nuts. Seeds. ?Dairy ?Low-fat or fat-free dairy products such as milk, yogurt, and cheese. ?The items listed above may not be a complete list of foods and beverages you can eat and drink. Contact a dietitian for more information. ?What foods should I avoid? ?Fruits ?Fruits canned with syrup. ?Vegetables ?Canned vegetables. Frozen vegetables with butter or cream sauce. ?Grains ?Refined white flour and flour products such as bread, pasta, snack foods, and cereals. Avoid all processed foods. ?Meats and other proteins ?Fatty cuts of meat. Poultry with skin. Breaded or fried meats. Processed meat. Avoid saturated fats. ?Dairy ?Full-fat yogurt, cheese, or milk. ?Beverages ?Sweetened drinks, such as soda or iced tea. ?The items listed above may not be a complete list of foods and beverages you  should avoid. Contact a dietitian for more information. ?Questions to ask a health care provider ?Do I need to meet with a certified diabetes care and education specialist? ?Do I need to meet with a dietitian? ?What number can I call if I have questions? ?When are the best times to check my blood glucose? ?Where to find more information: ?American Diabetes Association: diabetes.org ?Academy of Nutrition and Dietetics: eatright.org ?[employer] of Diabetes and Digestive and Kidney Diseases: AmenCredit.is ?Association of Diabetes Care & Education Specialists: diabeteseducator.org ?Summary ?It is important to have healthy eating habits because your blood sugar (glucose) levels are greatly affected by what you eat and drink. It is important to use alcohol carefully. ?A healthy meal plan will help you manage your blood glucose and lower your risk of heart disease. ?Your health care provider may recommend that you work with a dietitian to make a meal plan that is best for you. ?This information is not intended to replace advice given to you by your health care provider. Make sure you discuss any questions you have with your health care provider. ?Document Revised: 06/30/2020 Document Reviewed: 06/30/2020 ?Elsevier Patient Education ? [place]. ? ?

## 2022-04-10 ENCOUNTER — Other Ambulatory Visit (HOSPITAL_COMMUNITY): Payer: Self-pay

## 2022-04-20 ENCOUNTER — Other Ambulatory Visit (HOSPITAL_COMMUNITY): Payer: Self-pay

## 2022-04-25 ENCOUNTER — Encounter: Payer: Self-pay | Admitting: Gastroenterology

## 2022-05-02 ENCOUNTER — Encounter: Payer: Self-pay | Admitting: Gastroenterology

## 2022-05-02 ENCOUNTER — Ambulatory Visit (AMBULATORY_SURGERY_CENTER): Payer: No Typology Code available for payment source | Admitting: Gastroenterology

## 2022-05-02 ENCOUNTER — Telehealth: Payer: Self-pay | Admitting: Gastroenterology

## 2022-05-02 ENCOUNTER — Encounter: Payer: No Typology Code available for payment source | Admitting: Obstetrics & Gynecology

## 2022-05-02 VITALS — BP 127/71 | HR 72 | Temp 98.7°F | Resp 13 | Ht 63.0 in | Wt 244.0 lb

## 2022-05-02 DIAGNOSIS — K2289 Other specified disease of esophagus: Secondary | ICD-10-CM | POA: Diagnosis not present

## 2022-05-02 DIAGNOSIS — R194 Change in bowel habit: Secondary | ICD-10-CM

## 2022-05-02 DIAGNOSIS — R112 Nausea with vomiting, unspecified: Secondary | ICD-10-CM

## 2022-05-02 DIAGNOSIS — K219 Gastro-esophageal reflux disease without esophagitis: Secondary | ICD-10-CM

## 2022-05-02 DIAGNOSIS — K648 Other hemorrhoids: Secondary | ICD-10-CM | POA: Diagnosis not present

## 2022-05-02 DIAGNOSIS — K317 Polyp of stomach and duodenum: Secondary | ICD-10-CM

## 2022-05-02 DIAGNOSIS — Z800 Family history of malignant neoplasm of digestive organs: Secondary | ICD-10-CM | POA: Diagnosis not present

## 2022-05-02 DIAGNOSIS — D120 Benign neoplasm of cecum: Secondary | ICD-10-CM

## 2022-05-02 DIAGNOSIS — K2970 Gastritis, unspecified, without bleeding: Secondary | ICD-10-CM

## 2022-05-02 DIAGNOSIS — K449 Diaphragmatic hernia without obstruction or gangrene: Secondary | ICD-10-CM | POA: Diagnosis not present

## 2022-05-02 DIAGNOSIS — K635 Polyp of colon: Secondary | ICD-10-CM | POA: Diagnosis not present

## 2022-05-02 MED ORDER — SODIUM CHLORIDE 0.9 % IV SOLN: 500.0000 mL | Freq: Once | INTRAVENOUS | Status: DC

## 2022-05-02 NOTE — Telephone Encounter (Signed)
Inbound call from patient stating she is scheduled to have a endo and colon procedure today at 2:00 with Dr. Havery Moros. Patient stated she accidentally over looked her instructions where it states only liquids the day before procedure. Patient stated that yesterday around 2 she had mac and cheese, cornbread and a few pieces of chicken nuggets and again around 5. Patient started her prep already and used the bathroom for the first time around 4 this morning. Patient is seeking advice if she should go ahead and continue to take the second round of prep or if she needs to reschedule. Please advise.

## 2022-05-02 NOTE — Op Note (Signed)
[facility] Patient Name: Anna Mann Procedure Date: 05/02/2022 2:08 PM MRN: [id number] Endoscopist: Remo Lipps P. Havery Moros , MD Age: 61 Referring MD:  Date of Birth: [birth year] Gender: Female Account #: [id number] Procedure:                Upper GI endoscopy Indications:              Nausea with vomiting - intermittent - improved                            since clinic visit Medicines:                Monitored Anesthesia Care Procedure:                Pre-Anesthesia Assessment:                           - Prior to the procedure, a History and Physical                            was performed, and patient medications and                            allergies were reviewed. The patient's tolerance of                            previous anesthesia was also reviewed. The risks                            and benefits of the procedure and the sedation                            options and risks were discussed with the patient.                            All questions were answered, and informed consent                            was obtained. Prior Anticoagulants: The patient has                            taken no previous anticoagulant or antiplatelet                            agents. ASA Grade Assessment: III - A patient with                            severe systemic disease. After reviewing the risks                            and benefits, the patient was deemed in                            satisfactory condition to undergo the procedure.  After obtaining informed consent, the endoscope was                            passed under direct vision. Throughout the                            procedure, the patient's blood pressure, pulse, and                            oxygen saturations were monitored continuously. The                            GIF [id number] #[id number] was introduced through the                            mouth, and advanced to the second  part of duodenum.                            The upper GI endoscopy was accomplished without                            difficulty. The patient tolerated the procedure                            well. Scope In: Scope Out: Findings:                 Esophagogastric landmarks were identified: the                            Z-line was found at 38 cm, the gastroesophageal                            junction was found at 38 cm and the upper extent of                            the gastric folds was found at 39 cm from the                            incisors.                           A 1 cm hiatal hernia was present.                           Mucosal changes characterized by focal nodularity                            were found at the gastroesophageal junction,                            suspect benign inflammatory changes but biopsies                            were taken with a cold  forceps for histology.                           The exam of the esophagus was otherwise normal.                           Multiple small sessile polyps were found in the                            gastric fundus and in the gastric body, suspect                            benign fundic gland or hyperplastic polyps. Two                            representative polyps were removed with a cold                            biopsy forceps. Resection and retrieval were                            complete.                           The exam of the stomach was otherwise normal.                           Biopsies were taken with a cold forceps in the                            gastric body, at the incisura and in the gastric                            antrum for Helicobacter pylori testing.                           The duodenal bulb and second portion of the                            duodenum were normal. Complications:            No immediate complications. Estimated blood loss:                             Minimal. Estimated Blood Loss:     Estimated blood loss was minimal. Impression:               - Esophagogastric landmarks identified.                           - 1 cm hiatal hernia.                           - Nodular mucosa in the esophagus - likely benign  inflammatory change. Biopsied.                           - Multiple gastric polyps. Representative sample                            resected and retrieved.                           - Normal stomach otherwise - biopsies taken to rule                            out H pylori                           - Normal duodenal bulb and second portion of the                            duodenum. Recommendation:           - Patient has a contact number available for                            emergencies. The signs and symptoms of potential                            delayed complications were discussed with the                            patient. Return to normal activities tomorrow.                            Written discharge instructions were provided to the                            patient.                           - Resume previous diet.                           - Continue present medications.                           - Await pathology results. Remo Lipps P. Dresden Ament, MD 05/02/2022 2:59:06 PM This report has been signed electronically.

## 2022-05-02 NOTE — Progress Notes (Signed)
[facility] History and Physical   Primary Care Physician:  Patient, No Pcp Per (Inactive)   Reason for Procedure:   Nausea / vomiting, altered bowel habits, FH of CRC  Plan:    EGD and colonoscopy     HPI: Marquel Prosser is a 61 y.o. female  here for EGD and colonoscopy to evaluate symptoms as above. Doing better since last visit but still with intermittent symptoms. Has never had a prior colonoscopy.  Otherwise feels well without any cardiopulmonary symptoms. Discussed risks / benefits and she wants to proceed   Past Medical History:  Diagnosis Date   Abnormal Pap smear    colposcopy   Abnormal uterine bleeding (AUB)    Allergy    Diabetes mellitus without complication (HCC)    GERD (gastroesophageal reflux disease)    Heart murmur    Hyperlipidemia    Hypertension    Vertigo     Past Surgical History:  Procedure Laterality Date   COLPOSCOPY     DILATATION & CURETTAGE/HYSTEROSCOPY WITH MYOSURE N/A 05/25/2021   Procedure: DILATATION & CURETTAGE/HYSTEROSCOPY WITH MYOSURE;  Surgeon: Drema Dallas, DO;  Location: [facility];  Service: Gynecology;  Laterality: N/A;   DILATION AND CURETTAGE OF UTERUS     KNEE ARTHROSCOPY W/ ACL RECONSTRUCTION Left    LAPAROSCOPY N/A 05/25/2021   Procedure: LAPAROSCOPY DIAGNOSTIC,;  Surgeon: Drema Dallas, DO;  Location: [facility];  Service: Gynecology;  Laterality: N/A;   TUBAL LIGATION      Prior to Admission medications   Medication Sig Start Date End Date Taking? Authorizing Provider  atorvastatin (LIPITOR) 20 MG tablet Take 1 tablet (20 mg total) by mouth daily. 01/03/22  Yes Sagardia, Ines Bloomer, MD  empagliflozin (JARDIANCE) 25 MG TABS tablet Take 1 tablet (25 mg total) by mouth daily. 09/16/21  Yes   lisinopril (ZESTRIL) 10 MG tablet Take 1 tablet (10 mg total) by mouth daily. 01/13/22  Yes Sagardia, Ines Bloomer, MD  megestrol (MEGACE) 40 MG tablet Take 1/2 tablet by mouth once daily; can  increase to 2 tabs 2 times a day in event of heavy bleeding 04/18/21  Yes   Semaglutide (RYBELSUS) 7 MG TABS Take 1 tablet (7 mg) by mouth daily as directed. 01/03/22  Yes Sagardia, Ines Bloomer, MD  TRUEplus Lancets 28G MISC Use as instructed. Check blood glucose level by fingerstick twice per day. 11/22/19  Yes Gildardo Pounds, NP  glucose blood (TRUE METRIX BLOOD GLUCOSE TEST) test strip Use as instructed. Check blood glucose level by fingerstick twice per day.  E11.65 11/22/19   Gildardo Pounds, NP  ibuprofen (ADVIL) 800 MG tablet Take 1 tablet (800 mg total) by mouth every 8 (eight) hours as needed. 05/25/21   Drema Dallas, DO  loperamide (IMODIUM A-D) 2 MG tablet Take 1 tablet (2 mg total) by mouth as needed for diarrhea or loose stools. 03/08/22   Malvina Schadler, Carlota Raspberry, MD  omeprazole (PRILOSEC) 20 MG capsule Take 1 capsule (20 mg total) by mouth 2 (two) times daily before a meal. Patient taking differently: Take 20 mg by mouth as needed. 02/12/20 03/08/22  Gildardo Pounds, NP  ondansetron (ZOFRAN-ODT) 4 MG disintegrating tablet Dissolve 1 tablet (4 mg total) by mouth every 8 (eight) hours as needed for nausea or vomiting. 03/08/22   Ruari Mudgett, Carlota Raspberry, MD  fluticasone (FLONASE) 50 MCG/ACT nasal spray Place 1 spray into both nostrils daily. Patient not taking: Reported on 03/18/2020 01/25/20 12/15/20  Gildardo Pounds, NP  Current Outpatient Medications  Medication Sig Dispense Refill   atorvastatin (LIPITOR) 20 MG tablet Take 1 tablet (20 mg total) by mouth daily. 30 tablet 3   empagliflozin (JARDIANCE) 25 MG TABS tablet Take 1 tablet (25 mg total) by mouth daily. 30 tablet 3   lisinopril (ZESTRIL) 10 MG tablet Take 1 tablet (10 mg total) by mouth daily. 90 tablet 3   megestrol (MEGACE) 40 MG tablet Take 1/2 tablet by mouth once daily; can increase to 2 tabs 2 times a day in event of heavy bleeding 90 tablet 3   Semaglutide (RYBELSUS) 7 MG TABS Take 1 tablet (7 mg) by mouth daily as directed. 90  tablet 3   TRUEplus Lancets 28G MISC Use as instructed. Check blood glucose level by fingerstick twice per day. 100 each 3   glucose blood (TRUE METRIX BLOOD GLUCOSE TEST) test strip Use as instructed. Check blood glucose level by fingerstick twice per day.  E11.65 100 each 12   ibuprofen (ADVIL) 800 MG tablet Take 1 tablet (800 mg total) by mouth every 8 (eight) hours as needed. 30 tablet 0   loperamide (IMODIUM A-D) 2 MG tablet Take 1 tablet (2 mg total) by mouth as needed for diarrhea or loose stools. 30 tablet 0   omeprazole (PRILOSEC) 20 MG capsule Take 1 capsule (20 mg total) by mouth 2 (two) times daily before a meal. (Patient taking differently: Take 20 mg by mouth as needed.) 180 capsule 1   ondansetron (ZOFRAN-ODT) 4 MG disintegrating tablet Dissolve 1 tablet (4 mg total) by mouth every 8 (eight) hours as needed for nausea or vomiting. 30 tablet 1   Current Facility-Administered Medications  Medication Dose Route Frequency Provider Last Rate Last Admin   0.9 %  sodium chloride infusion  500 mL Intravenous Once Fernande Treiber, Carlota Raspberry, MD        Allergies as of 05/02/2022 - Review Complete 05/02/2022  Allergen Reaction Noted   Iodine  02/20/2012   Ivp dye [iodinated contrast media]  05/16/2021   Shellfish allergy  10/03/2019    Family History  Problem Relation Age of Onset   Aneurysm Mother    Heart disease Mother    Diabetes Mother    Hypertension Mother    Colon cancer Father        colon   Diabetes Sister    Kidney failure Sister    Heart failure Sister    Hodgkin's lymphoma Sister    Diabetes Brother    Heart disease Brother    Congestive Heart Failure Brother    Heart disease Brother    Diabetes Brother    Pancreatitis Brother    Heart failure Brother    Diabetes Maternal Grandmother    Hypertension Maternal Grandmother    Congestive Heart Failure Maternal Grandmother    Heart disease Paternal Grandmother    Hypertension Paternal Grandmother    Stroke Paternal  Grandfather    Ovarian cancer Maternal Aunt        2021   Colon cancer Maternal Aunt    Esophageal cancer Paternal Aunt    Hodgkin's lymphoma Niece    Diabetes Daughter    Other Daughter        pre diabetic   Kidney failure Nephew    Diabetes Nephew    Breast cancer Cousin    Colon polyps Neg Hx    Stomach cancer Neg Hx     Social History   Socioeconomic History   Marital status: Single  Spouse name: Not on file   Number of children: 3   Years of education: Not on file   Highest education level: Not on file  Occupational History   Occupation: Engineer, petroleum rep  Tobacco Use   Smoking status: Never   Smokeless tobacco: Never  Vaping Use   Vaping Use: Never used  Substance and Sexual Activity   Alcohol use: No   Drug use: No   Sexual activity: Not Currently    Birth control/protection: Post-menopausal  Other Topics Concern   Not on file  Social History Narrative   Not on file   Social Determinants of Health   Financial Resource Strain: Not on file  Food Insecurity: Not on file  Transportation Needs: Not on file  Physical Activity: Not on file  Stress: Not on file  Social Connections: Not on file  Intimate Partner Violence: Not on file    Review of Systems: All other review of systems negative except as mentioned in the HPI.  Physical Exam: Vital signs BP 112/68   Pulse 77   Temp 98.7 F (37.1 C)   Ht '5\' 3"'$  (1.6 m)   Wt 244 lb (110.7 kg)   LMP  (LMP Unknown) Comment: irregular  SpO2 97%   BMI 43.22 kg/m   General:   Alert,  Well-developed, pleasant and cooperative in NAD Lungs:  Clear throughout to auscultation.   Heart:  Regular rate and rhythm Abdomen:  Soft, nontender and nondistended.   Neuro/Psych:  Alert and cooperative. Normal mood and affect. A and O x 3  Jolly Mango, MD [facility]

## 2022-05-02 NOTE — Progress Notes (Signed)
VS  DT ? ?Pt's states no medical or surgical changes since previsit or office visit. ? ?

## 2022-05-02 NOTE — Op Note (Signed)
[facility] Patient Name: Anna Mann Procedure Date: 05/02/2022 2:08 PM MRN: [id number] Endoscopist: Remo Lipps P. Havery Moros , MD Age: 61 Referring MD:  Date of Birth: [birth year] Gender: Female Account #: [id number] Procedure:                Colonoscopy Indications:              Change in bowel habits - first time colonoscopy,                            father with colon cancer dx age 27s Medicines:                Monitored Anesthesia Care Procedure:                Pre-Anesthesia Assessment:                           - Prior to the procedure, a History and Physical                            was performed, and patient medications and                            allergies were reviewed. The patient's tolerance of                            previous anesthesia was also reviewed. The risks                            and benefits of the procedure and the sedation                            options and risks were discussed with the patient.                            All questions were answered, and informed consent                            was obtained. Prior Anticoagulants: The patient has                            taken no previous anticoagulant or antiplatelet                            agents. ASA Grade Assessment: III - A patient with                            severe systemic disease. After reviewing the risks                            and benefits, the patient was deemed in                            satisfactory condition to undergo the procedure.  After obtaining informed consent, the colonoscope                            was passed under direct vision. Throughout the                            procedure, the patient's blood pressure, pulse, and                            oxygen saturations were monitored continuously. The                            Olympus CF-HQ190L [id number]) Colonoscope was                            introduced through  the anus and advanced to the the                            terminal ileum, with identification of the                            appendiceal orifice and IC valve. The colonoscopy                            was performed without difficulty. The patient                            tolerated the procedure well. The quality of the                            bowel preparation was adequate. The terminal ileum,                            ileocecal valve, appendiceal orifice, and rectum                            were photographed. Scope In: 2:32:41 PM Scope Out: 0:25:85 PM Scope Withdrawal Time: 0 hours 12 minutes 43 seconds  Total Procedure Duration: 0 hours 16 minutes 30 seconds  Findings:                 The perianal and digital rectal examinations were                            normal.                           The terminal ileum appeared normal.                           A diminutive polyp was found in the cecum. The                            polyp was sessile. The polyp was removed with a  cold snare. Resection and retrieval were complete.                           Internal hemorrhoids were found during retroflexion.                           The exam was otherwise without abnormality.                           Biopsies for histology were taken with a cold                            forceps from the right colon, left colon and                            transverse colon for evaluation of microscopic                            colitis. Complications:            No immediate complications. Estimated blood loss:                            Minimal. Estimated Blood Loss:     Estimated blood loss was minimal. Impression:               - The examined portion of the ileum was normal.                           - One diminutive polyp in the cecum, removed with a                            cold snare. Resected and retrieved.                           - Internal  hemorrhoids.                           - The examination was otherwise normal.                           - Biopsies were taken with a cold forceps from the                            right colon, left colon and transverse colon for                            evaluation of microscopic colitis. Recommendation:           - Patient has a contact number available for                            emergencies. The signs and symptoms of potential                            delayed complications were discussed with  the                            patient. Return to normal activities tomorrow.                            Written discharge instructions were provided to the                            patient.                           - Resume previous diet.                           - Continue present medications.                           - Await pathology results. Remo Lipps P. Kyilee Gregg, MD 05/02/2022 2:54:10 PM This report has been signed electronically.

## 2022-05-02 NOTE — Progress Notes (Signed)
Report to PACU, RN, vss, BBS= Clear.  

## 2022-05-02 NOTE — Telephone Encounter (Signed)
Yes have her finish her prep and can take extra Miralax if needed. I think okay to proceed. Thanks

## 2022-05-02 NOTE — Patient Instructions (Signed)
Please read handouts provided. Continue present medications. Await pathology results.     YOU HAD AN ENDOSCOPIC PROCEDURE TODAY AT THE [facility]:   Refer to the procedure report that was given to you for any specific questions about what was found during the examination.  If the procedure report does not answer your questions, please call your gastroenterologist to clarify.  If you requested that your care partner not be given the details of your procedure findings, then the procedure report has been included in a sealed envelope for you to review at your convenience later.  YOU SHOULD EXPECT: Some feelings of bloating in the abdomen. Passage of more gas than usual.  Walking can help get rid of the air that was put into your GI tract during the procedure and reduce the bloating. If you had a lower endoscopy (such as a colonoscopy or flexible sigmoidoscopy) you may notice spotting of blood in your stool or on the toilet paper. If you underwent a bowel prep for your procedure, you may not have a normal bowel movement for a few days.  Please Note:  You might notice some irritation and congestion in your nose or some drainage.  This is from the oxygen used during your procedure.  There is no need for concern and it should clear up in a day or so.  SYMPTOMS TO REPORT IMMEDIATELY:   Following lower endoscopy (colonoscopy or flexible sigmoidoscopy):  Excessive amounts of blood in the stool  Significant tenderness or worsening of abdominal pains  Swelling of the abdomen that is new, acute  Fever of 100F or higher   Following upper endoscopy (EGD)  Vomiting of blood or coffee ground material  New chest pain or pain under the shoulder blades  Painful or persistently difficult swallowing  New shortness of breath  Fever of 100F or higher  Black, tarry-looking stools  For urgent or emergent issues, a gastroenterologist can be reached at any hour by calling [phone]. Do not  use MyChart messaging for urgent concerns.    DIET:  We do recommend a small meal at first, but then you may proceed to your regular diet.  Drink plenty of fluids but you should avoid alcoholic beverages for 24 hours.  ACTIVITY:  You should plan to take it easy for the rest of today and you should NOT DRIVE or use heavy machinery until tomorrow (because of the sedation medicines used during the test).    FOLLOW UP: Our staff will call the number listed on your records 48-72 hours following your procedure to check on you and address any questions or concerns that you may have regarding the information given to you following your procedure. If we do not reach you, we will leave a message.  We will attempt to reach you two times.  During this call, we will ask if you have developed any symptoms of COVID 19. If you develop any symptoms (ie: fever, flu-like symptoms, shortness of breath, cough etc.) before then, please call [phone].  If you test positive for Covid 19 in the 2 weeks post procedure, please call and report this information to us.    If any biopsies were taken you will be contacted by phone or by letter within the next 1-3 weeks.  Please call us at [phone] if you have not heard about the biopsies in 3 weeks.    SIGNATURES/CONFIDENTIALITY: You and/or your care partner have signed paperwork which will be entered into your electronic medical   record.  These signatures attest to the fact that that the information above on your After Visit Summary has been reviewed and is understood.  Full responsibility of the confidentiality of this discharge information lies with you and/or your care-partner. 

## 2022-05-02 NOTE — Progress Notes (Signed)
Called to room to assist during endoscopic procedure.  Patient ID and intended procedure confirmed with present staff. Received instructions for my participation in the procedure from the performing physician.  

## 2022-05-02 NOTE — Telephone Encounter (Signed)
Returned patient call.  Patient has taken this mornings dose of the prep already and is willing to go to store to purchase miralax as well.  Instructed to drink what she can up until 11:00 am and then NPO.  Patient verbalized understanding.

## 2022-05-03 ENCOUNTER — Telehealth: Payer: Self-pay

## 2022-05-03 NOTE — Telephone Encounter (Signed)
  Follow up Call-     05/02/2022    1:21 PM  Call back number  Post procedure Call Back phone  # [phone]  Permission to leave phone message Yes     Patient questions:  Do you have a fever, pain , or abdominal swelling? No. Pain Score  0 *  Have you tolerated food without any problems? Yes.    Have you been able to return to your normal activities? Yes.    Do you have any questions about your discharge instructions: Diet   No. Medications  No. Follow up visit  No.  Do you have questions or concerns about your Care? No.  Actions: * If pain score is 4 or above: No action needed, pain <4.

## 2022-05-04 ENCOUNTER — Other Ambulatory Visit (HOSPITAL_COMMUNITY): Payer: Self-pay

## 2022-05-05 ENCOUNTER — Other Ambulatory Visit (HOSPITAL_COMMUNITY): Payer: Self-pay

## 2022-05-05 MED ORDER — JARDIANCE 25 MG PO TABS
25.0000 mg | ORAL_TABLET | Freq: Every day | ORAL | 0 refills | Status: DC
  Filled 2022-05-05 – 2022-05-09 (×2): qty 30, 30d supply, fill #0

## 2022-05-09 ENCOUNTER — Other Ambulatory Visit (HOSPITAL_COMMUNITY): Payer: Self-pay

## 2022-05-09 ENCOUNTER — Telehealth: Payer: Self-pay | Admitting: Emergency Medicine

## 2022-05-09 ENCOUNTER — Other Ambulatory Visit: Payer: Self-pay | Admitting: *Deleted

## 2022-05-09 DIAGNOSIS — E1165 Type 2 diabetes mellitus with hyperglycemia: Secondary | ICD-10-CM

## 2022-05-09 MED ORDER — ATORVASTATIN CALCIUM 20 MG PO TABS
20.0000 mg | ORAL_TABLET | Freq: Every day | ORAL | 2 refills | Status: DC
  Filled 2022-05-09 (×2): qty 90, 90d supply, fill #0
  Filled 2022-05-10: qty 60, 60d supply, fill #0

## 2022-05-09 NOTE — Telephone Encounter (Signed)
Called patient to inform her of provider response to her medication request

## 2022-05-09 NOTE — Telephone Encounter (Signed)
Patient called to say that her insurance terms tomorrow. Pharmacy faxed requests for refills last Friday and she is calling to check the status of the request. Call back number is [phone]. Please call patient.

## 2022-05-09 NOTE — Telephone Encounter (Signed)
Cannot refill this medication.  Not sure why they are using it but it has to be refilled by the specialist who initially prescribed it.

## 2022-05-10 ENCOUNTER — Other Ambulatory Visit (HOSPITAL_COMMUNITY): Payer: Self-pay

## 2022-06-23 ENCOUNTER — Encounter: Payer: No Typology Code available for payment source | Admitting: Obstetrics & Gynecology

## 2022-07-03 ENCOUNTER — Ambulatory Visit: Payer: No Typology Code available for payment source | Admitting: Emergency Medicine

## 2022-07-21 ENCOUNTER — Other Ambulatory Visit (HOSPITAL_COMMUNITY): Payer: Self-pay

## 2022-07-25 ENCOUNTER — Other Ambulatory Visit (HOSPITAL_COMMUNITY): Payer: Self-pay

## 2022-07-28 ENCOUNTER — Telehealth: Payer: Self-pay | Admitting: General Practice

## 2022-07-28 NOTE — Telephone Encounter (Signed)
Pt called and is requesting some samples of empagliflozin (JARDIANCE) 25 MG TABS tablet. She said she lost her job 04/28/22. She just recently got a new job and her new insurance will not be active for another 3 weeks. She said she is out of rx and is requesting samples until her new insurance starts.    Please advise

## 2022-07-29 NOTE — Telephone Encounter (Signed)
She can definitely get samples if we have them.  Thanks.

## 2022-07-31 NOTE — Telephone Encounter (Signed)
Called patient to inform her that her samples will be at the front desk

## 2022-08-24 ENCOUNTER — Other Ambulatory Visit (HOSPITAL_COMMUNITY): Payer: Self-pay

## 2022-09-12 ENCOUNTER — Encounter: Payer: Self-pay | Admitting: Emergency Medicine

## 2022-09-12 NOTE — Telephone Encounter (Signed)
[place]ilder Glade, [place], [place], [place].  Insurance has to give [place] the available options.

## 2022-09-28 ENCOUNTER — Other Ambulatory Visit (HOSPITAL_COMMUNITY): Payer: Self-pay

## 2022-09-29 NOTE — Telephone Encounter (Signed)
Patient picked up medication

## 2022-10-04 ENCOUNTER — Ambulatory Visit
Admission: EM | Admit: 2022-10-04 | Discharge: 2022-10-04 | Disposition: A | Discharge status: Home / Self Care | Payer: Self-pay | Attending: Internal Medicine | Admitting: Internal Medicine

## 2022-10-04 DIAGNOSIS — R110 Nausea: Secondary | ICD-10-CM

## 2022-10-04 DIAGNOSIS — R197 Diarrhea, unspecified: Secondary | ICD-10-CM

## 2022-10-04 MED ORDER — LOPERAMIDE HCL 2 MG PO TABS: 2.0000 mg | ORAL_TABLET | ORAL | 0 refills | Status: DC | PRN

## 2022-10-04 NOTE — ED Provider Notes (Signed)
[facility]    CSN: [id number] Arrival date & time: 10/04/22  1154      History   Chief Complaint Chief Complaint  Patient presents with   Abdominal Pain    HPI Anna Mann is a 61 y.o. female.   Patient presents with nausea, diarrhea, abdominal discomfort that started about 3 days ago.  Patient reports that she has had nausea but no vomiting.  Denies any associated blood in stool.  Patient denies abdominal pain but reports that she just has generalized feeling of abdomen being "uncomfortable".  Denies any associated upper respiratory symptoms, cough, fever.  Denies any known sick contacts.  Patient reports that she has been tolerating fluids well.  No aggravating or relieving factors.  She has been having intermittent similar symptoms since March 2023.  She was followed by GI specialist who performed endoscopy and colonoscopy with no remarkable findings.  She was prescribed ondansetron and Imodium to take as needed for intermittent symptoms.  She states that she has taken ondansetron with improvement but apparently never got Imodium filled, so she did not have this available.  She reports that she would like this filled today.   Abdominal Pain   Past Medical History:  Diagnosis Date   Abnormal Pap smear    colposcopy   Abnormal uterine bleeding (AUB)    Allergy    Diabetes mellitus without complication (HCC)    GERD (gastroesophageal reflux disease)    Heart murmur    Hyperlipidemia    Hypertension    Vertigo     Patient Active Problem List   Diagnosis Date Noted   Hypertension associated with diabetes ([place]) 01/03/2022   Dyslipidemia associated with type 2 diabetes mellitus ([place]) 01/03/2022   Uncontrolled type 2 diabetes mellitus with hyperglycemia ([place]) 03/11/2021   Postmenopausal bleeding 12/18/2019   Fibroids 12/18/2019    Past Surgical History:  Procedure Laterality Date   COLPOSCOPY     DILATATION & CURETTAGE/HYSTEROSCOPY WITH MYOSURE N/A  05/25/2021   Procedure: DILATATION & CURETTAGE/HYSTEROSCOPY WITH MYOSURE;  Surgeon: Drema Dallas, DO;  Location: [facility];  Service: Gynecology;  Laterality: N/A;   DILATION AND CURETTAGE OF UTERUS     KNEE ARTHROSCOPY W/ ACL RECONSTRUCTION Left    LAPAROSCOPY N/A 05/25/2021   Procedure: LAPAROSCOPY DIAGNOSTIC,;  Surgeon: Drema Dallas, DO;  Location: [facility];  Service: Gynecology;  Laterality: N/A;   TUBAL LIGATION      OB History     Gravida  3   Para  3   Term  3   Preterm      AB      Living  3      SAB      IAB      Ectopic      Multiple      Live Births               Home Medications    Prior to Admission medications   Medication Sig Start Date End Date Taking? Authorizing Provider  atorvastatin (LIPITOR) 20 MG tablet Take 1 tablet (20 mg total) by mouth daily. 05/09/22   Horald Pollen, MD  empagliflozin (JARDIANCE) 25 MG TABS tablet Take 1 tablet (25 mg total) by mouth daily. 05/05/22     glucose blood (TRUE METRIX BLOOD GLUCOSE TEST) test strip Use as instructed. Check blood glucose level by fingerstick twice per day.  E11.65 11/22/19   Gildardo Pounds, NP  ibuprofen (ADVIL) 800 MG tablet  Take 1 tablet (800 mg total) by mouth every 8 (eight) hours as needed. 05/25/21   Drema Dallas, DO  lisinopril (ZESTRIL) 10 MG tablet Take 1 tablet (10 mg total) by mouth daily for kidney protection. 01/13/22   Horald Pollen, MD  loperamide (IMODIUM A-D) 2 MG tablet Take 1 tablet (2 mg total) by mouth as needed for diarrhea or loose stools. Take after loose stool. Do not take more than 8 doses in a day. 10/04/22   Teodora Medici, FNP  megestrol (MEGACE) 40 MG tablet Take 1/2 tablet by mouth once daily; can increase to 2 tabs 2 times a day in event of heavy bleeding 04/18/21     omeprazole (PRILOSEC) 20 MG capsule Take 1 capsule (20 mg total) by mouth 2 (two) times daily before a meal. Patient taking differently: Take 20  mg by mouth as needed. 02/12/20 03/08/22  Gildardo Pounds, NP  ondansetron (ZOFRAN-ODT) 4 MG disintegrating tablet Dissolve 1 tablet (4 mg total) by mouth every 8 (eight) hours as needed for nausea or vomiting. 03/08/22   Armbruster, Carlota Raspberry, MD  Semaglutide (RYBELSUS) 7 MG TABS Take 1 tablet (7 mg) by mouth daily as directed. 01/03/22   Horald Pollen, MD  TRUEplus Lancets 28G MISC Use as instructed. Check blood glucose level by fingerstick twice per day. 11/22/19   Gildardo Pounds, NP  fluticasone (FLONASE) 50 MCG/ACT nasal spray Place 1 spray into both nostrils daily. Patient not taking: Reported on 03/18/2020 01/25/20 12/15/20  Gildardo Pounds, NP    Family History Family History  Problem Relation Age of Onset   Aneurysm Mother    Heart disease Mother    Diabetes Mother    Hypertension Mother    Colon cancer Father        colon   Diabetes Sister    Kidney failure Sister    Heart failure Sister    Hodgkin's lymphoma Sister    Diabetes Brother    Heart disease Brother    Congestive Heart Failure Brother    Heart disease Brother    Diabetes Brother    Pancreatitis Brother    Heart failure Brother    Diabetes Maternal Grandmother    Hypertension Maternal Grandmother    Congestive Heart Failure Maternal Grandmother    Heart disease Paternal Grandmother    Hypertension Paternal Grandmother    Stroke Paternal Grandfather    Ovarian cancer Maternal Aunt        2021   Colon cancer Maternal Aunt    Esophageal cancer Paternal Aunt    Hodgkin's lymphoma Niece    Diabetes Daughter    Other Daughter        pre diabetic   Kidney failure Nephew    Diabetes Nephew    Breast cancer Cousin    Colon polyps Neg Hx    Stomach cancer Neg Hx     Social History Social History   Tobacco Use   Smoking status: Never   Smokeless tobacco: Never  Vaping Use   Vaping Use: Never used  Substance Use Topics   Alcohol use: No   Drug use: No     Allergies   Iodine, Ivp dye [iodinated  contrast media], and Shellfish allergy   Review of Systems Review of Systems Per HPI  Physical Exam Triage Vital Signs ED Triage Vitals  Enc Vitals Group     BP 10/04/22 1217 (!) 167/96     Pulse Rate 10/04/22 1217 63  Resp 10/04/22 1217 16     Temp 10/04/22 1217 98.2 F (36.8 C)     Temp Source 10/04/22 1217 Oral     SpO2 10/04/22 1217 96 %     Weight --      Height --      Head Circumference --      Peak Flow --      Pain Score 10/04/22 1218 5     Pain Loc --      Pain Edu? --      Excl. in [place]? --    No data found.  Updated Vital Signs BP 138/84 (BP Location: Left Arm)   Pulse 63   Temp 98.2 F (36.8 C) (Oral)   Resp 16   LMP  (LMP Unknown) Comment: irregular  SpO2 96%   Visual Acuity Right Eye Distance:   Left Eye Distance:   Bilateral Distance:    Right Eye Near:   Left Eye Near:    Bilateral Near:     Physical Exam Constitutional:      General: She is not in acute distress.    Appearance: Normal appearance. She is not toxic-appearing or diaphoretic.  HENT:     Head: Normocephalic and atraumatic.  Eyes:     Extraocular Movements: Extraocular movements intact.     Conjunctiva/sclera: Conjunctivae normal.  Cardiovascular:     Rate and Rhythm: Normal rate and regular rhythm.     Pulses: Normal pulses.     Heart sounds: Normal heart sounds.  Pulmonary:     Effort: Pulmonary effort is normal. No respiratory distress.     Breath sounds: Normal breath sounds.  Abdominal:     General: Bowel sounds are normal. There is no distension.     Palpations: Abdomen is soft.     Tenderness: There is no abdominal tenderness.     Hernia: No hernia is present.  Neurological:     General: No focal deficit present.     Mental Status: She is alert and oriented to person, place, and time. Mental status is at baseline.  Psychiatric:        Mood and Affect: Mood normal.        Behavior: Behavior normal.        Thought Content: Thought content normal.         Judgment: Judgment normal.      UC Treatments / Results  Labs (all labs ordered are listed, but only abnormal results are displayed) Labs Reviewed - No data to display  EKG   Radiology No results found.  Procedures Procedures (including critical care time)  Medications Ordered in UC Medications - No data to display  Initial Impression / Assessment and Plan / UC Course  I have reviewed the triage vital signs and the nursing notes.  Pertinent labs & imaging results that were available during my care of the patient were reviewed by me and considered in my medical decision making (see chart for details).     Patient could have viral gastroenteritis but it could also be related to patient's chronic, intermittent nausea, vomiting, diarrhea where she is followed by GI specialty.  She has had improvement in nausea with ondansetron.  After further review of patient's chart, it appears that she was prescribed ondansetron and Imodium to take as needed for these intermittent symptoms.  Therefore, I do think it is reasonable to prescribe patient Imodium to take as needed for diarrhea but patient was advised to use sparingly.  No  obvious contraindications to Imodium noted on exam.  No signs of acute abdomen or dehydration on exam.  Therefore, do not think that emergent evaluation is necessary.  Although, patient was advised to follow-up if symptoms persist or worsen and was given strict ER precautions.  Also advised patient to follow-up with GI specialty for persistent and worsening symptoms given chronicity of symptoms.  Advised adequate fluid hydration as well.  Patient verbalized understanding and was agreeable with plan. Final Clinical Impressions(s) / UC Diagnoses   Final diagnoses:  Nausea without vomiting  Diarrhea, unspecified type     Discharge Instructions      I have refilled your Imodium to help alleviate diarrhea.  Please discontinue if you have any constipation, abdominal  pain, blood in stool.  Please use this sparingly.  Please ensure adequate fluid hydration. You may take the Zofran as needed for nausea as well that you already have.  Follow-up with GI specialist especially if symptoms persist or worsen given that this has been a chronic, intermittent issue for you.    ED Prescriptions     Medication Sig Dispense Auth. Provider   loperamide (IMODIUM A-D) 2 MG tablet Take 1 tablet (2 mg total) by mouth as needed for diarrhea or loose stools. Take after loose stool. Do not take more than 8 doses in a day. 30 tablet [place], Michele Rockers, [place]      PDMP not reviewed this encounter.   Teodora Medici, Corinne 10/04/22 1252

## 2022-10-04 NOTE — ED Triage Notes (Signed)
Pt c/o diarrhea mon, Tuesday. And states today her stomach feels "off." Reports this happen periodically.

## 2022-10-04 NOTE — Discharge Instructions (Addendum)
I have refilled your Imodium to help alleviate diarrhea.  Please discontinue if you have any constipation, abdominal pain, blood in stool.  Please use this sparingly.  Please ensure adequate fluid hydration. You may take the Zofran as needed for nausea as well that you already have.  Follow-up with GI specialist especially if symptoms persist or worsen given that this has been a chronic, intermittent issue for you.

## 2022-10-06 ENCOUNTER — Ambulatory Visit
Admission: EM | Admit: 2022-10-06 | Discharge: 2022-10-06 | Disposition: A | Discharge status: Other Acute Inpatient Hospital | Payer: Self-pay | Attending: Physician Assistant | Admitting: Physician Assistant

## 2022-10-06 ENCOUNTER — Encounter (HOSPITAL_COMMUNITY): Payer: Self-pay

## 2022-10-06 ENCOUNTER — Emergency Department (HOSPITAL_COMMUNITY): Payer: Self-pay

## 2022-10-06 ENCOUNTER — Emergency Department (HOSPITAL_COMMUNITY)
Admission: EM | Admit: 2022-10-06 | Discharge: 2022-10-06 | Disposition: A | Discharge status: Home / Self Care | Payer: Self-pay | Attending: Emergency Medicine | Admitting: Emergency Medicine

## 2022-10-06 ENCOUNTER — Telehealth: Payer: Self-pay | Admitting: Emergency Medicine

## 2022-10-06 ENCOUNTER — Ambulatory Visit: Payer: Self-pay | Admitting: Nurse Practitioner

## 2022-10-06 DIAGNOSIS — R42 Dizziness and giddiness: Secondary | ICD-10-CM

## 2022-10-06 DIAGNOSIS — R1013 Epigastric pain: Secondary | ICD-10-CM

## 2022-10-06 DIAGNOSIS — E1165 Type 2 diabetes mellitus with hyperglycemia: Secondary | ICD-10-CM

## 2022-10-06 LAB — CBC WITH DIFFERENTIAL/PLATELET
Abs Immature Granulocytes: 0.01 10*3/uL (ref 0.00–0.07)
Basophils Absolute: 0 10*3/uL (ref 0.0–0.1)
Basophils Relative: 1 %
Eosinophils Absolute: 0.2 10*3/uL (ref 0.0–0.5)
Eosinophils Relative: 3 %
HCT: 44 % (ref 36.0–46.0)
Hemoglobin: 14.1 g/dL (ref 12.0–15.0)
Immature Granulocytes: 0 %
Lymphocytes Relative: 39 %
Lymphs Abs: 2.7 10*3/uL (ref 0.7–4.0)
MCH: 29.1 pg (ref 26.0–34.0)
MCHC: 32 g/dL (ref 30.0–36.0)
MCV: 90.9 fL (ref 80.0–100.0)
Monocytes Absolute: 0.4 10*3/uL (ref 0.1–1.0)
Monocytes Relative: 6 %
Neutro Abs: 3.7 10*3/uL (ref 1.7–7.7)
Neutrophils Relative %: 51 %
Platelets: 220 10*3/uL (ref 150–400)
RBC: 4.84 MIL/uL (ref 3.87–5.11)
RDW: 13.3 % (ref 11.5–15.5)
WBC: 7.1 10*3/uL (ref 4.0–10.5)
nRBC: 0 % (ref 0.0–0.2)

## 2022-10-06 LAB — COMPREHENSIVE METABOLIC PANEL
ALT: 21 U/L (ref 0–44)
AST: 21 U/L (ref 15–41)
Albumin: 4 g/dL (ref 3.5–5.0)
Alkaline Phosphatase: 62 U/L (ref 38–126)
Anion gap: 7 (ref 5–15)
BUN: 13 mg/dL (ref 8–23)
CO2: 28 mmol/L (ref 22–32)
Calcium: 9.5 mg/dL (ref 8.9–10.3)
Chloride: 109 mmol/L (ref 98–111)
Creatinine, Ser: 0.72 mg/dL (ref 0.44–1.00)
GFR, Estimated: >60 mL/min (ref >60)
Glucose, Bld: 122 mg/dL — ABNORMAL HIGH (ref 70–99)
Potassium: 3.6 mmol/L (ref 3.5–5.1)
Sodium: 144 mmol/L (ref 135–145)
Total Bilirubin: 0.6 mg/dL (ref 0.3–1.2)
Total Protein: 7.5 g/dL (ref 6.5–8.1)

## 2022-10-06 LAB — POCT FASTING CBG KUC MANUAL ENTRY: POCT Glucose (KUC): 124 mg/dL — AB (ref 70–99)

## 2022-10-06 LAB — TROPONIN I (HIGH SENSITIVITY): Troponin I (High Sensitivity): 11 ng/L (ref <18)

## 2022-10-06 MED ORDER — ATORVASTATIN CALCIUM 20 MG PO TABS: 20.0000 mg | ORAL_TABLET | Freq: Every day | ORAL | 2 refills | Status: DC

## 2022-10-06 MED ORDER — EMPAGLIFLOZIN 25 MG PO TABS: 25.0000 mg | ORAL_TABLET | Freq: Every day | ORAL | 0 refills | Status: DC

## 2022-10-06 MED ORDER — RYBELSUS 7 MG PO TABS: 7.0000 mg | ORAL_TABLET | Freq: Every day | ORAL | 3 refills | Status: DC

## 2022-10-06 MED ORDER — LISINOPRIL 10 MG PO TABS: 10.0000 mg | ORAL_TABLET | Freq: Every day | ORAL | 3 refills | Status: DC

## 2022-10-06 NOTE — ED Provider Triage Note (Signed)
Emergency Medicine Provider Triage Evaluation Note  Anna Mann , a 61 y.o. female  was evaluated in triage.  Pt complains of dizziness, wooziness,.  She states her symptoms are mostly resolved at this time, but is not concerned about her high blood pressure.  Patient did check her blood sugar at home which was 186.  This is high for her.  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  Urgent care sent her here for further work-up due to EKG changes.  Review of Systems  Positive: As above Negative: Chest pain, shortness of breath, hemoptysis  Physical Exam  BP (!) 150/92 (BP Location: Left Arm)   Pulse 70   Temp 98.4 F (36.9 C) (Oral)   Resp 18   LMP  (LMP Unknown) Comment: irregular  SpO2 97%  Gen:   Awake, no distress   Resp:  Normal effort  MSK:   Moves extremities without difficulty  Other:    Medical Decision Making  Medically screening exam initiated at 3:21 PM.  Appropriate orders placed.  Anna Mann was informed that the remainder of the evaluation will be completed by another provider, this initial triage assessment does not replace that evaluation, and the importance of remaining in the ED until their evaluation is complete.  ACS rule out   Anna Mann, Anna Mann 10/06/22 1523

## 2022-10-06 NOTE — Telephone Encounter (Signed)
Pt called to report: BP 138/84 and blood sugar reading 187 today at work. Symptoms "woozy head" caused her to have it checked at work. Scheduled pt appt today 10/27 @ 3:20 pm. Transferred call to triage [facility] [phone]- Janett Billow

## 2022-10-06 NOTE — ED Triage Notes (Addendum)
Pt presents to [facility] with co of fuzzy headedness, and felt like she was going to pass out. Vision was blurry.  Pt checked bp and it was 138/84 and bg was 187 at work. Pt scheduled an appointment at pcp and was told to come to uc.     Pt reports she was just seen for upper epigatric pain that radiates down under her left breast. Pt reports same symptoms today.

## 2022-10-06 NOTE — ED Notes (Signed)
Patient is being discharged from the Urgent Care and sent to the Emergency Department via pov daughters car . Per myers, PA, patient is in need of higher level of care due to symptoms. Patient is aware and verbalizes understanding of plan of care.  Vitals:   10/06/22 1244 10/06/22 1249  BP:  (!) 152/80  Pulse: 94   Resp: 18   Temp: 98.1 F (36.7 C)   SpO2: 98%

## 2022-10-06 NOTE — ED Provider Notes (Signed)
Patient here today for evaluation of lightheadedness, blurry vision that started today. She reports she was at work and was told to come to [facility] or ED. She reports she has had some upper epigastric pain that radiates to her left breast. I recommended further evaluation in the ED for cardiac work up given significant co-morbidities, etc. Patient expresses understanding and daughter will transport her to ED via [facility].    Francene Finders, PA-C 10/06/22 1325

## 2022-10-06 NOTE — ED Triage Notes (Signed)
Pt arrived via POV, c/o dizziness, "feeling woozy" states was seen at [facility] and sent for abnormal EKG

## 2022-10-06 NOTE — ED Provider Notes (Signed)
[facility] DEPT Provider Note   CSN: [id number] Arrival date & time: 10/06/22  1357     History  Chief Complaint  Patient presents with   Dizziness    Anna Mann is a 61 y.o. female.  61 year old female presents after she had episode of being slightly dizzy and feeling woozy.  Notes that she did not have any headache.  Did not have any chest pressure or chest pain.  Was not short of breath.  No focal neurological findings.  Was at work when it happened and he took her blood pressure was 138/80 and her blood sugar was 187.  Does have a history of hyper pressure and diabetes.  Sent to the urgent care center for evaluation was seen there briefly and transferred here for further management.  Patient states she currently feels back to her baseline       Home Medications Prior to Admission medications   Medication Sig Start Date End Date Taking? Authorizing Provider  atorvastatin (LIPITOR) 20 MG tablet Take 1 tablet (20 mg total) by mouth daily. 05/09/22   Horald Pollen, MD  empagliflozin (JARDIANCE) 25 MG TABS tablet Take 1 tablet (25 mg total) by mouth daily. 05/05/22     glucose blood (TRUE METRIX BLOOD GLUCOSE TEST) test strip Use as instructed. Check blood glucose level by fingerstick twice per day.  E11.65 11/22/19   Gildardo Pounds, NP  ibuprofen (ADVIL) 800 MG tablet Take 1 tablet (800 mg total) by mouth every 8 (eight) hours as needed. 05/25/21   Drema Dallas, DO  lisinopril (ZESTRIL) 10 MG tablet Take 1 tablet (10 mg total) by mouth daily for kidney protection. 01/13/22   Horald Pollen, MD  loperamide (IMODIUM A-D) 2 MG tablet Take 1 tablet (2 mg total) by mouth as needed for diarrhea or loose stools. Take after loose stool. Do not take more than 8 doses in a day. 10/04/22   Teodora Medici, FNP  megestrol (MEGACE) 40 MG tablet Take 1/2 tablet by mouth once daily; can increase to 2 tabs 2 times a day in event of heavy bleeding  04/18/21     omeprazole (PRILOSEC) 20 MG capsule Take 1 capsule (20 mg total) by mouth 2 (two) times daily before a meal. Patient taking differently: Take 20 mg by mouth as needed. 02/12/20 03/08/22  Gildardo Pounds, NP  ondansetron (ZOFRAN-ODT) 4 MG disintegrating tablet Dissolve 1 tablet (4 mg total) by mouth every 8 (eight) hours as needed for nausea or vomiting. 03/08/22   Armbruster, Carlota Raspberry, MD  Semaglutide (RYBELSUS) 7 MG TABS Take 1 tablet (7 mg) by mouth daily as directed. 01/03/22   Horald Pollen, MD  TRUEplus Lancets 28G MISC Use as instructed. Check blood glucose level by fingerstick twice per day. 11/22/19   Gildardo Pounds, NP  fluticasone (FLONASE) 50 MCG/ACT nasal spray Place 1 spray into both nostrils daily. Patient not taking: Reported on 03/18/2020 01/25/20 12/15/20  Gildardo Pounds, NP      Allergies    Iodine, Ivp dye [iodinated contrast media], and Shellfish allergy    Review of Systems   Review of Systems  All other systems reviewed and are negative.   Physical Exam Updated Vital Signs BP (!) 150/92 (BP Location: Left Arm)   Pulse 70   Temp 98.4 F (36.9 C) (Oral)   Resp 18   LMP  (LMP Unknown) Comment: irregular  SpO2 97%  Physical Exam Vitals and nursing note reviewed.  Constitutional:      General: She is not in acute distress.    Appearance: Normal appearance. She is well-developed. She is not toxic-appearing.  HENT:     Head: Normocephalic and atraumatic.  Eyes:     General: Lids are normal.     Conjunctiva/sclera: Conjunctivae normal.     Pupils: Pupils are equal, round, and reactive to light.  Neck:     Thyroid: No thyroid mass.     Trachea: No tracheal deviation.  Cardiovascular:     Rate and Rhythm: Normal rate and regular rhythm.     Heart sounds: Normal heart sounds. No murmur heard.    No gallop.  Pulmonary:     Effort: Pulmonary effort is normal. No respiratory distress.     Breath sounds: Normal breath sounds. No stridor. No  decreased breath sounds, wheezing, rhonchi or rales.  Abdominal:     General: There is no distension.     Palpations: Abdomen is soft.     Tenderness: There is no abdominal tenderness. There is no rebound.  Musculoskeletal:        General: No tenderness. Normal range of motion.     Cervical back: Normal range of motion and neck supple.  Skin:    General: Skin is warm and dry.     Findings: No abrasion or rash.  Neurological:     Mental Status: She is alert and oriented to person, place, and time. Mental status is at baseline.     GCS: GCS eye subscore is 4. GCS verbal subscore is 5. GCS motor subscore is 6.     Cranial Nerves: No cranial nerve deficit.     Sensory: No sensory deficit.     Motor: Motor function is intact.  Psychiatric:        Attention and Perception: Attention normal.        Speech: Speech normal.        Behavior: Behavior normal.     ED Results / Procedures / Treatments   Labs (all labs ordered are listed, but only abnormal results are displayed) Labs Reviewed  COMPREHENSIVE METABOLIC PANEL - Abnormal; Notable for the following components:      Result Value   Glucose, Bld 122 (*)    All other components within normal limits  CBC WITH DIFFERENTIAL/PLATELET  TROPONIN I (HIGH SENSITIVITY)  TROPONIN I (HIGH SENSITIVITY)    EKG None ED ECG REPORT   Date: 10/06/2022  Rate: 70  Rhythm: normal sinus rhythm  QRS Axis: normal  Intervals: normal  ST/T Wave abnormalities: normal  Conduction Disutrbances:none  Narrative Interpretation:   Old EKG Reviewed: unchanged  I have personally reviewed the EKG tracing and agree with the computerized printout as noted.  Radiology DG Chest 1 View  Result Date: 10/06/2022 CLINICAL DATA:  Chest pain. EXAM: CHEST  1 VIEW COMPARISON:  Chest x-ray Apr 16, 2011. FINDINGS: Mild enlargement the cardiac silhouette. No consolidation. No visible pleural effusions or pneumothorax. No acute osseous abnormality. IMPRESSION: No  active disease. Electronically Signed   By: Margaretha Sheffield M.D.   On: 10/06/2022 15:20    Procedures Procedures    Medications Ordered in ED Medications - No data to display  ED Course/ Medical Decision Making/ A&P                           Medical Decision Making  Patient is EKG per my interpretation shows normal sinus rhythm.  No signs  of acute ischemic changes.  Patient's x-ray of chest per my interpretation shows no acute findings..  Patient troponin is negative here.  Patient asymptomatic at this time.  Low suspicion for ACS or PE.  She has no focal neurological findings.  Do not think that she has any evidence of CVA.  Will discharge home.        Final Clinical Impression(s) / ED Diagnoses Final diagnoses:  None    Rx / DC Orders ED Discharge Orders     None         Lacretia Leigh, MD 10/06/22 2031

## 2022-10-09 ENCOUNTER — Other Ambulatory Visit (HOSPITAL_COMMUNITY): Payer: Self-pay

## 2022-10-09 ENCOUNTER — Ambulatory Visit (INDEPENDENT_AMBULATORY_CARE_PROVIDER_SITE_OTHER): Payer: Self-pay | Admitting: Student

## 2022-10-09 ENCOUNTER — Encounter: Payer: Self-pay | Admitting: Student

## 2022-10-09 VITALS — BP 154/105 | HR 61 | Ht 63.0 in | Wt 247.8 lb

## 2022-10-09 DIAGNOSIS — Z0000 Encounter for general adult medical examination without abnormal findings: Secondary | ICD-10-CM

## 2022-10-09 DIAGNOSIS — E785 Hyperlipidemia, unspecified: Secondary | ICD-10-CM

## 2022-10-09 DIAGNOSIS — Z7984 Long term (current) use of oral hypoglycemic drugs: Secondary | ICD-10-CM

## 2022-10-09 DIAGNOSIS — N950 Postmenopausal bleeding: Secondary | ICD-10-CM

## 2022-10-09 DIAGNOSIS — E1165 Type 2 diabetes mellitus with hyperglycemia: Secondary | ICD-10-CM

## 2022-10-09 DIAGNOSIS — Z23 Encounter for immunization: Secondary | ICD-10-CM

## 2022-10-09 DIAGNOSIS — I152 Hypertension secondary to endocrine disorders: Secondary | ICD-10-CM

## 2022-10-09 DIAGNOSIS — E1159 Type 2 diabetes mellitus with other circulatory complications: Secondary | ICD-10-CM

## 2022-10-09 DIAGNOSIS — E1169 Type 2 diabetes mellitus with other specified complication: Secondary | ICD-10-CM

## 2022-10-09 LAB — POCT GLYCOSYLATED HEMOGLOBIN (HGB A1C): Hemoglobin A1C: 7.4 % — AB (ref 4.0–5.6)

## 2022-10-09 LAB — GLUCOSE, CAPILLARY: Glucose-Capillary: 140 mg/dL — ABNORMAL HIGH (ref 70–99)

## 2022-10-09 MED ORDER — ATORVASTATIN CALCIUM 20 MG PO TABS
20.0000 mg | ORAL_TABLET | Freq: Every day | ORAL | 2 refills | Status: DC
  Filled 2022-10-09: qty 30, 30d supply, fill #0
  Filled 2023-02-21: qty 30, 30d supply, fill #1

## 2022-10-09 MED ORDER — LISINOPRIL 10 MG PO TABS
10.0000 mg | ORAL_TABLET | Freq: Every day | ORAL | 2 refills | Status: DC
  Filled 2022-10-09: qty 30, 30d supply, fill #0
  Filled 2023-01-05: qty 30, 30d supply, fill #1
  Filled 2023-02-21: qty 30, 30d supply, fill #2

## 2022-10-09 MED ORDER — EMPAGLIFLOZIN 25 MG PO TABS
25.0000 mg | ORAL_TABLET | Freq: Every day | ORAL | 2 refills | Status: AC
  Filled 2022-10-09: qty 30, 30d supply, fill #0
  Filled 2023-01-05 – 2023-01-09 (×2): qty 30, 30d supply, fill #1
  Filled 2023-06-18: qty 30, 30d supply, fill #2

## 2022-10-09 MED ORDER — METFORMIN HCL 500 MG PO TABS
500.0000 mg | ORAL_TABLET | Freq: Every day | ORAL | 2 refills | Status: DC
  Filled 2022-10-09: qty 30, 30d supply, fill #0

## 2022-10-09 NOTE — Assessment & Plan Note (Addendum)
Blood pressure was well controlled on lisinopril 10 mg in the past.  She ran out of her lisinopril few weeks ago.  BP is expectedly elevated today at 154/105.  -Will resume lisinopril 10 mg for now.  If blood pressure remains elevated, consider adding a second agent -Recheck BP and BMP in 1 month

## 2022-10-09 NOTE — Patient Instructions (Signed)
Anna Mann,  It was a pleasure seeing you in the clinic today.  Here is a summary what we talked about:  1.  Type 2 diabetes: I will resume your Jardiance 25 mg daily.  Unfortunately we cannot get Rybelsus from our pharmacy due to cost.  I will start you back on metformin 500 mg daily.  Please know if you have any side effects from this medication.  2.  Your blood pressure is high today.  I will restart you on lisinopril 10 mg daily.  We will recheck your blood pressure and kidney function in 1 month.  3.  I resumed your atorvastatin 20 mg.  Please return in 1 month.  Please pick up a financial assistant program packet at the front desk  Take care  Dr. Alfonse Spruce

## 2022-10-09 NOTE — Assessment & Plan Note (Signed)
Flu shot given today

## 2022-10-09 NOTE — Assessment & Plan Note (Signed)
-  Resume atorvastatin 20 mg daily -Recheck lipid panel in a few months

## 2022-10-09 NOTE — Assessment & Plan Note (Signed)
Patient reports history of postmenopausal bleeding from multiple uterine polyps.  Patient was scheduled for hysterectomy but lost her insurance in the interim.  She was on Megace but discontinue after improvement of her bleeding status post D&C.  -Pending [facility] card application before referral to OB/GYN

## 2022-10-09 NOTE — Assessment & Plan Note (Addendum)
Her prior regimen for diabetes was Jardiance 25 mg and Rybelsus 7 mg daily.  She was on metformin in the past but discontinued due to muscle cramping.  Patient has been taking Jardiance and Rybelsus intermittently due to the lack of supply.  Recheck A1c today of 7.4, up from 6.8 in April.  -Resume Jardiance 25 mg daily.  This is part of the 4$ medication list. -Unfortunately Rybelsus is not part of the discount medication list.  Patient is not willing to try injectable medication. -Patient agrees with resuming low-dose metformin 500 mg daily.  Can titrate up if tolerated -Will need to perform foot exam at next visit.  Patient declined today -Pending [facility] card application before referral to ophthalmology -A1C in 3 months

## 2022-10-09 NOTE — Progress Notes (Signed)
CC: Establish care   HPI:  [name] is a 61 y.o. with past medical history of hypertension, hyperlipidemia, type 2 diabetes who presents to the clinic to establish care.  She was seen in the emergency room on 10/27 for dizziness, blurry vision and epigastric pain.  All her blood work including troponin there were unremarkable.  EKG did not show signs of ischemia.  Her symptom resolved in the emergency room so they were discharged home.  Patient is seen today to establish care with [facility].  Her last PCP was at [facility] but patient lost her job recently so she needs financial assistance for her medications.  She was out of most of her medications except for Jardiance and Rybelsus.  She has received samples from last PCP.  Social history -Currently in training for a new job -No smoking, alcohol or drug use  Family history -Strong family history of diabetes in mother, brother and sister  Past Medical History:  Diagnosis Date   Abnormal Pap smear    colposcopy   Abnormal uterine bleeding (AUB)    Allergy    Diabetes mellitus without complication (HCC)    GERD (gastroesophageal reflux disease)    Heart murmur    Hyperlipidemia    Hypertension    Vertigo    Review of Systems:  per HPI  Physical Exam:  Vitals:   10/09/22 1523  BP: (!) 154/105  Pulse: 61  SpO2: 96%  Weight: 247 lb 12.8 oz (112.4 kg)  Height: '5\' 3"'$  (1.6 m)   Physical Exam Constitutional:      General: She is not in acute distress.    Appearance: She is not ill-appearing.  HENT:     Head: Normocephalic.  Eyes:     General:        Right eye: No discharge.        Left eye: No discharge.     Conjunctiva/sclera: Conjunctivae normal.  Cardiovascular:     Rate and Rhythm: Normal rate and regular rhythm.     Heart sounds: No murmur heard. Pulmonary:     Effort: Pulmonary effort is normal. No respiratory distress.     Breath sounds: Normal breath sounds. No wheezing.  Abdominal:     General: Bowel  sounds are normal. There is no distension.     Palpations: Abdomen is soft.     Tenderness: There is no abdominal tenderness.  Musculoskeletal:        General: Normal range of motion.     Cervical back: Normal range of motion.  Skin:    General: Skin is warm.  Neurological:     Mental Status: She is alert. Mental status is at baseline.      Assessment & Plan:   See Encounters Tab for problem based charting.  Hypertension associated with diabetes ([place]) Blood pressure was well controlled on lisinopril 10 mg in the past.  She ran out of her lisinopril few weeks ago.  BP is expectedly elevated today at 154/105.  -Will resume lisinopril 10 mg for now.  If blood pressure remains elevated, consider adding a second agent -Recheck BP and BMP in 1 month  Uncontrolled type 2 diabetes mellitus with hyperglycemia (HCC) Her prior regimen for diabetes was Jardiance 25 mg and Rybelsus 7 mg daily.  She was on metformin in the past but discontinued due to muscle cramping.  Patient has been taking Jardiance and Rybelsus intermittently due to the lack of supply.  Recheck A1c today of 7.4, up from  6.8 in April.  -Resume Jardiance 25 mg daily.  This is part of the 4$ medication list. -Unfortunately Rybelsus is not part of the discount medication list.  Patient is not willing to try injectable medication. -Patient agrees with resuming low-dose metformin 500 mg daily.  Can titrate up if tolerated -Will need to perform foot exam at next visit.  Patient declined today -Pending [facility] card application before referral to ophthalmology -A1C in 3 months  Dyslipidemia associated with type 2 diabetes mellitus ([place]) -Resume atorvastatin 20 mg daily -Recheck lipid panel in a few months  Postmenopausal bleeding Patient reports history of postmenopausal bleeding from multiple uterine polyps.  Patient was scheduled for hysterectomy but lost her insurance in the interim.  She was on Megace but discontinue after  improvement of her bleeding status post D&C.  -Pending [employer] card application before referral to OB/GYN  Healthcare maintenance -Flu shot given today.   Patient discussed with Dr. Dareen Piano

## 2022-10-11 NOTE — Progress Notes (Signed)
Internal Medicine Clinic Attending  Case discussed with Dr. Nguyen  At the time of the visit.  We reviewed the resident's history and exam and pertinent patient test results.  I agree with the assessment, diagnosis, and plan of care documented in the resident's note. 

## 2023-01-06 ENCOUNTER — Other Ambulatory Visit: Payer: Self-pay

## 2023-01-06 ENCOUNTER — Other Ambulatory Visit (HOSPITAL_COMMUNITY): Payer: Self-pay

## 2023-01-08 ENCOUNTER — Other Ambulatory Visit: Payer: Self-pay

## 2023-01-09 ENCOUNTER — Other Ambulatory Visit (HOSPITAL_COMMUNITY): Payer: Self-pay

## 2023-01-10 ENCOUNTER — Other Ambulatory Visit (HOSPITAL_COMMUNITY): Payer: Self-pay

## 2023-01-11 ENCOUNTER — Other Ambulatory Visit (HOSPITAL_COMMUNITY): Payer: Self-pay

## 2023-01-11 DIAGNOSIS — Z419 Encounter for procedure for purposes other than remedying health state, unspecified: Secondary | ICD-10-CM | POA: Diagnosis not present

## 2023-01-12 ENCOUNTER — Other Ambulatory Visit (HOSPITAL_COMMUNITY): Payer: Self-pay

## 2023-02-21 ENCOUNTER — Other Ambulatory Visit (HOSPITAL_COMMUNITY): Payer: Self-pay

## 2023-02-21 ENCOUNTER — Ambulatory Visit (INDEPENDENT_AMBULATORY_CARE_PROVIDER_SITE_OTHER): Payer: Medicaid Other | Admitting: Emergency Medicine

## 2023-02-21 ENCOUNTER — Encounter: Payer: Self-pay | Admitting: Emergency Medicine

## 2023-02-21 VITALS — BP 146/92 | HR 83 | Temp 98.3°F | Ht 63.0 in | Wt 239.2 lb

## 2023-02-21 DIAGNOSIS — E1165 Type 2 diabetes mellitus with hyperglycemia: Secondary | ICD-10-CM

## 2023-02-21 DIAGNOSIS — I152 Hypertension secondary to endocrine disorders: Secondary | ICD-10-CM

## 2023-02-21 DIAGNOSIS — E1159 Type 2 diabetes mellitus with other circulatory complications: Secondary | ICD-10-CM

## 2023-02-21 DIAGNOSIS — E785 Hyperlipidemia, unspecified: Secondary | ICD-10-CM | POA: Diagnosis not present

## 2023-02-21 DIAGNOSIS — E1169 Type 2 diabetes mellitus with other specified complication: Secondary | ICD-10-CM | POA: Diagnosis not present

## 2023-02-21 MED ORDER — ATORVASTATIN CALCIUM 20 MG PO TABS
20.0000 mg | ORAL_TABLET | Freq: Every day | ORAL | 3 refills | Status: DC
  Filled 2023-02-21: qty 90, 90d supply, fill #0
  Filled 2023-06-08 – 2023-06-13 (×2): qty 90, 90d supply, fill #1
  Filled 2024-01-24 – 2024-02-06 (×2): qty 90, 90d supply, fill #2

## 2023-02-21 MED ORDER — LISINOPRIL 20 MG PO TABS
20.0000 mg | ORAL_TABLET | Freq: Every day | ORAL | 3 refills | Status: DC
  Filled 2023-02-21: qty 90, 90d supply, fill #0
  Filled 2023-06-08: qty 90, 90d supply, fill #1
  Filled 2024-01-24 – 2024-02-06 (×2): qty 90, 90d supply, fill #2

## 2023-02-21 MED ORDER — EMPAGLIFLOZIN 25 MG PO TABS
25.0000 mg | ORAL_TABLET | Freq: Every day | ORAL | 3 refills | Status: DC
  Filled 2023-02-21: qty 90, 90d supply, fill #0
  Filled 2023-06-08 – 2024-02-06 (×3): qty 90, 90d supply, fill #1

## 2023-02-21 NOTE — Patient Instructions (Signed)

## 2023-02-21 NOTE — Assessment & Plan Note (Signed)
Chronic stable condition.  Well-controlled. Continue atorvastatin 20 mg daily. Intolerant to metformin The 10-year ASCVD risk score (Arnett DK, et al., 2019) is: 27.5%   Values used to calculate the score:     Age: 62 years     Sex: Female     Is Non-Hispanic African American: Yes     Diabetic: Yes     Tobacco smoker: No     Systolic Blood Pressure: 123456 mmHg     Is BP treated: Yes     HDL Cholesterol: 29.7 mg/dL     Total Cholesterol: 176 mg/dL

## 2023-02-21 NOTE — Progress Notes (Signed)
Wt Readings from Last 3 Encounters:  02/21/23 239 lb 4 oz (108.5 kg)  10/09/22 247 lb 12.8 oz (112.4 kg)  05/02/22 244 lb (110.7 kg)   Anna Mann 62 y.o.   Chief Complaint  Patient presents with   Medical Management of Chronic Issues    Med refill appt, f/u appt   Patient is about to lose her insurance, she can't afford her jardiance or Rybelsus    HISTORY OF PRESENT ILLNESS: This is a 62 y.o. female with history of hypertension and diabetes here for follow-up. Presently only taking Jardiance 25 mg daily. Taking lisinopril for hypertension  HPI   Prior to Admission medications   Medication Sig Start Date End Date Taking? Authorizing Provider  atorvastatin (LIPITOR) 20 MG tablet Take 1 tablet (20 mg total) by mouth daily. 10/09/22 03/23/23 Yes Gaylan Gerold, DO  empagliflozin (JARDIANCE) 25 MG TABS tablet Take by mouth daily.   Yes [provider]  glucose blood (TRUE METRIX BLOOD GLUCOSE TEST) test strip Use as instructed. Check blood glucose level by fingerstick twice per day.  E11.65 11/22/19  Yes Gildardo Pounds, NP  ibuprofen (ADVIL) 800 MG tablet Take 1 tablet (800 mg total) by mouth every 8 (eight) hours as needed. 05/25/21  Yes Delora Fuel, Melissa, DO  lisinopril (ZESTRIL) 10 MG tablet Take 1 tablet (10 mg total) by mouth daily for kidney protection. 10/09/22 03/23/23 Yes Gaylan Gerold, DO  loperamide (IMODIUM A-D) 2 MG tablet Take 1 tablet (2 mg total) by mouth as needed for diarrhea or loose stools. Take after loose stool. Do not take more than 8 doses in a day. 10/04/22  Yes Mound, Hildred Alamin E, FNP  ondansetron (ZOFRAN-ODT) 4 MG disintegrating tablet Dissolve 1 tablet (4 mg total) by mouth every 8 (eight) hours as needed for nausea or vomiting. 03/08/22  Yes Armbruster, Carlota Raspberry, MD  TRUEplus Lancets 28G MISC Use as instructed. Check blood glucose level by fingerstick twice per day. 11/22/19  Yes Gildardo Pounds, NP  omeprazole (PRILOSEC) 20 MG capsule Take 1 capsule (20  mg total) by mouth 2 (two) times daily before a meal. Patient taking differently: Take 20 mg by mouth as needed. 02/12/20 03/08/22  Gildardo Pounds, NP  fluticasone (FLONASE) 50 MCG/ACT nasal spray Place 1 spray into both nostrils daily. Patient not taking: Reported on 03/18/2020 01/25/20 12/15/20  Gildardo Pounds, NP    Allergies  Allergen Reactions   Iodine    Ivp Dye [Iodinated Contrast Media]    Shellfish Allergy     Patient Active Problem List   Diagnosis Date Noted   Hypertension associated with diabetes ([place]) 01/03/2022   Dyslipidemia associated with type 2 diabetes mellitus ([place]) 01/03/2022   Uncontrolled type 2 diabetes mellitus with hyperglycemia ([place]) 03/11/2021   Fibroids 12/18/2019    Past Medical History:  Diagnosis Date   Abnormal Pap smear    colposcopy   Abnormal uterine bleeding (AUB)    Allergy    Diabetes mellitus without complication ([place])    GERD (gastroesophageal reflux disease)    Heart murmur    Hyperlipidemia    Hypertension    Vertigo     Past Surgical History:  Procedure Laterality Date   COLPOSCOPY     DILATATION & CURETTAGE/HYSTEROSCOPY WITH MYOSURE N/A 05/25/2021   Procedure: DILATATION & CURETTAGE/HYSTEROSCOPY WITH MYOSURE;  Surgeon: Drema Dallas, DO;  Location: [facility];  Service: Gynecology;  Laterality: N/A;   DILATION AND CURETTAGE OF UTERUS  KNEE ARTHROSCOPY W/ ACL RECONSTRUCTION Left    LAPAROSCOPY N/A 05/25/2021   Procedure: LAPAROSCOPY DIAGNOSTIC,;  Surgeon: Drema Dallas, DO;  Location: [facility];  Service: Gynecology;  Laterality: N/A;   TUBAL LIGATION      Social History   Socioeconomic History   Marital status: Single    Spouse name: Not on file   Number of children: 3   Years of education: Not on file   Highest education level: Not on file  Occupational History   Occupation: Engineer, petroleum rep  Tobacco Use   Smoking status: Never   Smokeless tobacco: Never  Vaping Use   Vaping Use:  Never used  Substance and Sexual Activity   Alcohol use: No   Drug use: No   Sexual activity: Not Currently    Birth control/protection: Post-menopausal  Other Topics Concern   Not on file  Social History Narrative   Not on file   Social Determinants of Health   Financial Resource Strain: Not on file  Food Insecurity: Not on file  Transportation Needs: Not on file  Physical Activity: Not on file  Stress: Not on file  Social Connections: Not on file  Intimate Partner Violence: Not on file    Family History  Problem Relation Age of Onset   Aneurysm Mother    Heart disease Mother    Diabetes Mother    Hypertension Mother    Colon cancer Father        colon   Diabetes Sister    Kidney failure Sister    Heart failure Sister    Hodgkin's lymphoma Sister    Diabetes Brother    Heart disease Brother    Congestive Heart Failure Brother    Heart disease Brother    Diabetes Brother    Pancreatitis Brother    Heart failure Brother    Diabetes Maternal Grandmother    Hypertension Maternal Grandmother    Congestive Heart Failure Maternal Grandmother    Heart disease Paternal Grandmother    Hypertension Paternal Grandmother    Stroke Paternal Grandfather    Ovarian cancer Maternal Aunt        2021   Colon cancer Maternal Aunt    Esophageal cancer Paternal Aunt    Hodgkin's lymphoma Niece    Diabetes Daughter    Other Daughter        pre diabetic   Kidney failure Nephew    Diabetes Nephew    Breast cancer Cousin    Colon polyps Neg Hx    Stomach cancer Neg Hx      Review of Systems  Constitutional: Negative.  Negative for chills and fever.  HENT: Negative.  Negative for congestion and sore throat.   Respiratory: Negative.  Negative for cough and shortness of breath.   Cardiovascular: Negative.  Negative for chest pain and palpitations.  Gastrointestinal:  Negative for abdominal pain, diarrhea, nausea and vomiting.  Genitourinary: Negative.  Negative for dysuria  and hematuria.  Skin: Negative.  Negative for rash.  Neurological: Negative.  Negative for dizziness and headaches.  All other systems reviewed and are negative.   Vitals:   02/21/23 1547  BP: (!) 146/92  Pulse: 83  Temp: 98.3 F (36.8 C)  SpO2: 95%    Physical Exam Vitals reviewed.  Constitutional:      Appearance: Normal appearance.  HENT:     Head: Normocephalic.  Eyes:     Extraocular Movements: Extraocular movements intact.     Pupils: Pupils  are equal, round, and reactive to light.  Cardiovascular:     Rate and Rhythm: Normal rate and regular rhythm.     Pulses: Normal pulses.     Heart sounds: Normal heart sounds.  Pulmonary:     Effort: Pulmonary effort is normal.     Breath sounds: Normal breath sounds.  Skin:    General: Skin is warm and dry.  Neurological:     Mental Status: She is alert and oriented to person, place, and time.  Psychiatric:        Mood and Affect: Mood normal.        Behavior: Behavior normal.      ASSESSMENT & PLAN: A total of 44 minutes was spent with the patient and counseling/coordination of care regarding preparing for this visit, review of most recent office visit notes, review of most recent blood work results including interpretation of today's hemoglobin A1c, review of all medications and changes made, cardiovascular risks associated with diabetes and hypertension, education on nutrition, prognosis, documentation, need for follow-up.   Problem List Items Addressed This Visit       Cardiovascular and Mediastinum   Hypertension associated with diabetes ([place]) - Primary    Elevated blood pressure reading in the office today. Recommend to increase lisinopril to 20 mg daily. Well-controlled diabetes with hemoglobin A1c of 6.4. Continue Jardiance 25 mg daily. Intolerant to metformin Cardiovascular risks associated with hypertension and diabetes discussed Diet and nutrition discussed. Follow-up in 6 months      Relevant  Medications   empagliflozin (JARDIANCE) 25 MG TABS tablet   atorvastatin (LIPITOR) 20 MG tablet   lisinopril (ZESTRIL) 20 MG tablet     Endocrine   Dyslipidemia associated with type 2 diabetes mellitus ([facility])    Chronic stable condition.  Well-controlled. Continue atorvastatin 20 mg daily. Intolerant to metformin The 10-year ASCVD risk score (Arnett DK, et al., 2019) is: 27.5%   Values used to calculate the score:     Age: 49 years     Sex: Female     Is Non-Hispanic African American: Yes     Diabetic: Yes     Tobacco smoker: No     Systolic Blood Pressure: 123456 mmHg     Is BP treated: Yes     HDL Cholesterol: 29.7 mg/dL     Total Cholesterol: 176 mg/dL       Relevant Medications   empagliflozin (JARDIANCE) 25 MG TABS tablet   atorvastatin (LIPITOR) 20 MG tablet   lisinopril (ZESTRIL) 20 MG tablet   Other Visit Diagnoses     Type 2 diabetes mellitus with hyperglycemia, without long-term current use of insulin ([facility])       Relevant Medications   empagliflozin (JARDIANCE) 25 MG TABS tablet   atorvastatin (LIPITOR) 20 MG tablet   lisinopril (ZESTRIL) 20 MG tablet      Patient Instructions  Diabetes Mellitus and Nutrition, Adult When you have diabetes, or diabetes mellitus, it is very important to have healthy eating habits because your blood sugar (glucose) levels are greatly affected by what you eat and drink. Eating healthy foods in the right amounts, at about the same times every day, can help you: Manage your blood glucose. Lower your risk of heart disease. Improve your blood pressure. Reach or maintain a healthy weight. What can affect my meal plan? Every person with diabetes is different, and each person has different needs for a meal plan. Your health care provider may recommend that you work with a  dietitian to make a meal plan that is best for you. Your meal plan may vary depending on factors such as: The calories you need. The medicines you take. Your  weight. Your blood glucose, blood pressure, and cholesterol levels. Your activity level. Other health conditions you have, such as heart or kidney disease. How do carbohydrates affect me? Carbohydrates, also called carbs, affect your blood glucose level more than any other type of food. Eating carbs raises the amount of glucose in your blood. It is important to know how many carbs you can safely have in each meal. This is different for every person. Your dietitian can help you calculate how many carbs you should have at each meal and for each snack. How does alcohol affect me? Alcohol can cause a decrease in blood glucose (hypoglycemia), especially if you use insulin or take certain diabetes medicines by mouth. Hypoglycemia can be a life-threatening condition. Symptoms of hypoglycemia, such as sleepiness, dizziness, and confusion, are similar to symptoms of having too much alcohol. Do not drink alcohol if: Your health care provider tells you not to drink. You are pregnant, may be pregnant, or are planning to become pregnant. If you drink alcohol: Limit how much you have to: 0-1 drink a day for women. 0-2 drinks a day for men. Know how much alcohol is in your drink. In the [place] one drink equals one 12 oz bottle of beer (355 mL), one 5 oz glass of wine (148 mL), or one 1 oz glass of hard liquor (44 mL). Keep yourself hydrated with water, diet soda, or unsweetened iced tea. Keep in mind that regular soda, juice, and other mixers may contain a lot of sugar and must be counted as carbs. What are tips for following this plan?  Reading food labels Start by checking the serving size on the Nutrition Facts label of packaged foods and drinks. The number of calories and the amount of carbs, fats, and other nutrients listed on the label are based on one serving of the item. Many items contain more than one serving per package. Check the total grams (g) of carbs in one serving. Check the number of grams  of saturated fats and trans fats in one serving. Choose foods that have a low amount or none of these fats. Check the number of milligrams (mg) of salt (sodium) in one serving. Most people should limit total sodium intake to less than 2,300 mg per day. Always check the nutrition information of foods labeled as "low-fat" or "nonfat." These foods may be higher in added sugar or refined carbs and should be avoided. Talk to your dietitian to identify your daily goals for nutrients listed on the label. Shopping Avoid buying canned, pre-made, or processed foods. These foods tend to be high in fat, sodium, and added sugar. Shop around the outside edge of the grocery store. This is where you will most often find fresh fruits and vegetables, bulk grains, fresh meats, and fresh dairy products. Cooking Use low-heat cooking methods, such as baking, instead of high-heat cooking methods, such as deep frying. Cook using healthy oils, such as olive, canola, or sunflower oil. Avoid cooking with butter, cream, or high-fat meats. Meal planning Eat meals and snacks regularly, preferably at the same times every day. Avoid going long periods of time without eating. Eat foods that are high in fiber, such as fresh fruits, vegetables, beans, and whole grains. Eat 4-6 oz (112-168 g) of lean protein each day, such as lean meat, chicken,  fish, eggs, or tofu. One ounce (oz) (28 g) of lean protein is equal to: 1 oz (28 g) of meat, chicken, or fish. 1 egg.  cup (62 g) of tofu. Eat some foods each day that contain healthy fats, such as avocado, nuts, seeds, and fish. What foods should I eat? Fruits Berries. Apples. Oranges. Peaches. Apricots. Plums. Grapes. Mangoes. Papayas. Pomegranates. Kiwi. Cherries. Vegetables Leafy greens, including lettuce, spinach, kale, chard, collard greens, mustard greens, and cabbage. Beets. Cauliflower. Broccoli. Carrots. Green beans. Tomatoes. Peppers. Onions. Cucumbers. Brussels  sprouts. Grains Whole grains, such as whole-wheat or whole-grain bread, crackers, tortillas, cereal, and pasta. Unsweetened oatmeal. Quinoa. Brown or wild rice. Meats and other proteins Seafood. Poultry without skin. Lean cuts of poultry and beef. Tofu. Nuts. Seeds. Dairy Low-fat or fat-free dairy products such as milk, yogurt, and cheese. The items listed above may not be a complete list of foods and beverages you can eat and drink. Contact a dietitian for more information. What foods should I avoid? Fruits Fruits canned with syrup. Vegetables Canned vegetables. Frozen vegetables with butter or cream sauce. Grains Refined white flour and flour products such as bread, pasta, snack foods, and cereals. Avoid all processed foods. Meats and other proteins Fatty cuts of meat. Poultry with skin. Breaded or fried meats. Processed meat. Avoid saturated fats. Dairy Full-fat yogurt, cheese, or milk. Beverages Sweetened drinks, such as soda or iced tea. The items listed above may not be a complete list of foods and beverages you should avoid. Contact a dietitian for more information. Questions to ask a health care provider Do I need to meet with a certified diabetes care and education specialist? Do I need to meet with a dietitian? What number can I call if I have questions? When are the best times to check my blood glucose? Where to find more information: American Diabetes Association: diabetes.org Academy of Nutrition and Dietetics: eatright.[employer] of Diabetes and Digestive and Kidney Diseases: [employer] Association of Diabetes Care & Education Specialists: diabeteseducator.org Summary It is important to have healthy eating habits because your blood sugar (glucose) levels are greatly affected by what you eat and drink. It is important to use alcohol carefully. A healthy meal plan will help you manage your blood glucose and lower your risk of heart disease. Your health  care provider may recommend that you work with a dietitian to make a meal plan that is best for you. This information is not intended to replace advice given to you by your health care provider. Make sure you discuss any questions you have with your health care provider. Document Revised: 06/30/2020 Document Reviewed: 06/30/2020 Elsevier Patient Education  [place], MD [facility]

## 2023-02-21 NOTE — Assessment & Plan Note (Addendum)
Elevated blood pressure reading in the office today. Recommend to increase lisinopril to 20 mg daily. Well-controlled diabetes with hemoglobin A1c of 6.4. Continue Jardiance 25 mg daily. Intolerant to metformin Cardiovascular risks associated with hypertension and diabetes discussed Diet and nutrition discussed. Follow-up in 6 months

## 2023-02-27 ENCOUNTER — Other Ambulatory Visit (HOSPITAL_COMMUNITY): Payer: Self-pay

## 2023-02-27 ENCOUNTER — Other Ambulatory Visit: Payer: Self-pay

## 2023-02-28 ENCOUNTER — Other Ambulatory Visit: Payer: Self-pay

## 2023-02-28 ENCOUNTER — Other Ambulatory Visit: Payer: Self-pay | Admitting: Emergency Medicine

## 2023-02-28 ENCOUNTER — Encounter: Payer: Self-pay | Admitting: Emergency Medicine

## 2023-02-28 ENCOUNTER — Other Ambulatory Visit (HOSPITAL_COMMUNITY): Payer: Self-pay

## 2023-02-28 DIAGNOSIS — R051 Acute cough: Secondary | ICD-10-CM

## 2023-02-28 DIAGNOSIS — J0100 Acute maxillary sinusitis, unspecified: Secondary | ICD-10-CM

## 2023-02-28 MED ORDER — HYDROCODONE BIT-HOMATROP MBR 5-1.5 MG/5ML PO SOLN
5.0000 mL | Freq: Every evening | ORAL | 0 refills | Status: DC | PRN
  Filled 2023-02-28: qty 120, 24d supply, fill #0

## 2023-02-28 MED ORDER — AMOXICILLIN-POT CLAVULANATE 875-125 MG PO TABS
1.0000 | ORAL_TABLET | Freq: Two times a day (BID) | ORAL | 0 refills | Status: AC
  Filled 2023-02-28: qty 14, 7d supply, fill #0

## 2023-02-28 NOTE — Telephone Encounter (Signed)
Antibiotic and cough medicine sent to pharmacy of record today.  Thanks.

## 2023-03-01 ENCOUNTER — Other Ambulatory Visit: Payer: Self-pay

## 2023-04-13 ENCOUNTER — Emergency Department (HOSPITAL_COMMUNITY)
Admission: EM | Admit: 2023-04-13 | Discharge: 2023-04-14 | Disposition: A | Discharge status: Home / Self Care | Payer: Self-pay | Attending: Emergency Medicine | Admitting: Emergency Medicine

## 2023-04-13 ENCOUNTER — Ambulatory Visit
Admission: EM | Admit: 2023-04-13 | Discharge: 2023-04-13 | Disposition: A | Discharge status: Home / Self Care | Payer: Medicaid Other

## 2023-04-13 ENCOUNTER — Encounter (HOSPITAL_COMMUNITY): Payer: Self-pay

## 2023-04-13 ENCOUNTER — Emergency Department (HOSPITAL_COMMUNITY): Payer: Self-pay

## 2023-04-13 DIAGNOSIS — M545 Low back pain, unspecified: Secondary | ICD-10-CM | POA: Insufficient documentation

## 2023-04-13 DIAGNOSIS — R109 Unspecified abdominal pain: Secondary | ICD-10-CM | POA: Insufficient documentation

## 2023-04-13 DIAGNOSIS — R1030 Lower abdominal pain, unspecified: Secondary | ICD-10-CM

## 2023-04-13 LAB — URINALYSIS, ROUTINE W REFLEX MICROSCOPIC
Bacteria, UA: NONE SEEN
Bilirubin Urine: NEGATIVE
Glucose, UA: >=500 mg/dL — AB
Hgb urine dipstick: NEGATIVE
Ketones, ur: NEGATIVE mg/dL
Nitrite: NEGATIVE
Protein, ur: NEGATIVE mg/dL
Specific Gravity, Urine: 1.029 (ref 1.005–1.030)
pH: 7 (ref 5.0–8.0)

## 2023-04-13 LAB — COMPREHENSIVE METABOLIC PANEL
ALT: 15 U/L (ref 0–44)
AST: 19 U/L (ref 15–41)
Albumin: 3.6 g/dL (ref 3.5–5.0)
Alkaline Phosphatase: 58 U/L (ref 38–126)
Anion gap: 10 (ref 5–15)
BUN: 15 mg/dL (ref 8–23)
CO2: 23 mmol/L (ref 22–32)
Calcium: 9.3 mg/dL (ref 8.9–10.3)
Chloride: 108 mmol/L (ref 98–111)
Creatinine, Ser: 0.72 mg/dL (ref 0.44–1.00)
GFR, Estimated: >60 mL/min (ref >60)
Glucose, Bld: 161 mg/dL — ABNORMAL HIGH (ref 70–99)
Potassium: 3.9 mmol/L (ref 3.5–5.1)
Sodium: 141 mmol/L (ref 135–145)
Total Bilirubin: 0.5 mg/dL (ref 0.3–1.2)
Total Protein: 6.6 g/dL (ref 6.5–8.1)

## 2023-04-13 LAB — CBC
HCT: 44.1 % (ref 36.0–46.0)
Hemoglobin: 13.8 g/dL (ref 12.0–15.0)
MCH: 28.7 pg (ref 26.0–34.0)
MCHC: 31.3 g/dL (ref 30.0–36.0)
MCV: 91.7 fL (ref 80.0–100.0)
Platelets: 213 10*3/uL (ref 150–400)
RBC: 4.81 MIL/uL (ref 3.87–5.11)
RDW: 13.5 % (ref 11.5–15.5)
WBC: 7.4 10*3/uL (ref 4.0–10.5)
nRBC: 0 % (ref 0.0–0.2)

## 2023-04-13 LAB — LIPASE, BLOOD: Lipase: 33 U/L (ref 11–51)

## 2023-04-13 NOTE — ED Triage Notes (Signed)
Pt states that she went to UC for back that has been going on for the past [redacted] week along with lower abd pain with some dysuria, pt reports that she has pelvic at [facility] and was sent here for a scan.

## 2023-04-13 NOTE — ED Triage Notes (Addendum)
Patient c/o bilateral lower back pain and pressure in the lower abdomen x 2 weeks. Patient states the symptoms went away for a few days and then returned 4 days.  Patient states her bladder has dropped. Patient denies dysuria or frequency, but states her urine is "dark and foamy"  Patient states she has been using a pain patch to the her back and Ibuprofen 200 mg at 0800 today.

## 2023-04-13 NOTE — ED Provider Triage Note (Signed)
Emergency Medicine Provider Triage Evaluation Note  Anna Mann , a 62 y.o. female  was evaluated in triage.  Pt complains of bilateral lower back pain for 2 weeks.  She states that she stands on her feet a lot at work and noticed the pain after this but no fall or direct injury to the back.  She denies associated bowel or bladder dysfunction, dysuria, hematuria, chest pain, shortness of breath, focal weakness, paresthesias.  She denies a history of IV drug use or malignancy.  She reports that today at work she started having a burning-like pain across all of her abdomen and she presented to urgent care where they did a pelvic exam and questioned if she had bladder or uterine prolapse and sent her to the ED for further evaluation.  Review of Systems  Positive: See HPI Negative: See HPI  Physical Exam  BP (!) 150/72   Pulse 71   Temp 98.8 F (37.1 C) (Oral)   Resp 16   LMP  (LMP Unknown) Comment: irregular  SpO2 93%  Gen:   Awake, no distress   Resp:  Normal effort  MSK:   Moves extremities without difficulty  Other:  Abdomen soft, nontender, nondistended, no rebound, guarding, or peritoneal signs, no CVA tenderness, no midline spinal tenderness, step-offs, or deformities; nonfocal neuroexam  Medical Decision Making  Medically screening exam initiated at 7:33 PM.  Appropriate orders placed.  Jaymes Nole was informed that the remainder of the evaluation will be completed by another provider, this initial triage assessment does not replace that evaluation, and the importance of remaining in the ED until their evaluation is complete.     Richardson Dopp 04/13/23 1935

## 2023-04-13 NOTE — ED Provider Notes (Signed)
[facility]    CSN: [id number] Arrival date & time: 04/13/23  1502      History   Chief Complaint Chief Complaint  Patient presents with   Abdominal Pain   Back Pain    HPI Anna Mann is a 62 y.o. female.   Patient presents with lower back pain that radiates around to her abdomen that has been present for about 2 weeks.  Reports symptoms resolved slightly and then returned with increased severity about 5 days ago.  Denies any injury to the area.  Reports that her urine has been foamy as well since symptoms started.  Denies dysuria, urinary frequency, hematuria.  Patient does report that her "bladder dropped".  She reports that there is a protrusion that she can feel when wiping.  She states this occurred multiple years ago with the birth of her children but it resolved and then returned about 2 weeks prior when the back pain and abdominal pain started.  Denies fever, body aches, chills.  Denies nausea, vomiting, diarrhea.  Having normal bowel movements with no blood in stool.  Reports back pain increased in severity today and it was rated 10/10 on pain scale.  It has improved slightly to a 7/10 on pain scale.  She has taken oxycodone and ibuprofen for pain.   Abdominal Pain Back Pain   Past Medical History:  Diagnosis Date   Abnormal Pap smear    colposcopy   Abnormal uterine bleeding (AUB)    Allergy    Diabetes mellitus without complication (HCC)    GERD (gastroesophageal reflux disease)    Heart murmur    Hyperlipidemia    Hypertension    Vertigo     Patient Active Problem List   Diagnosis Date Noted   Hypertension associated with diabetes (HCC) 01/03/2022   Dyslipidemia associated with type 2 diabetes mellitus (HCC) 01/03/2022   Uncontrolled type 2 diabetes mellitus with hyperglycemia (HCC) 03/11/2021   Fibroids 12/18/2019    Past Surgical History:  Procedure Laterality Date   COLPOSCOPY     DILATATION & CURETTAGE/HYSTEROSCOPY WITH MYOSURE N/A  05/25/2021   Procedure: DILATATION & CURETTAGE/HYSTEROSCOPY WITH MYOSURE;  Surgeon: Steva Ready, DO;  Location: [facility];  Service: Gynecology;  Laterality: N/A;   DILATION AND CURETTAGE OF UTERUS     KNEE ARTHROSCOPY W/ ACL RECONSTRUCTION Left    LAPAROSCOPY N/A 05/25/2021   Procedure: LAPAROSCOPY DIAGNOSTIC,;  Surgeon: Steva Ready, DO;  Location:  SURGERY CENTER;  Service: Gynecology;  Laterality: N/A;   TUBAL LIGATION      OB History     Gravida  3   Para  3   Term  3   Preterm      AB      Living  3      SAB      IAB      Ectopic      Multiple      Live Births               Home Medications    Prior to Admission medications   Medication Sig Start Date End Date Taking? Authorizing Provider  atorvastatin (LIPITOR) 20 MG tablet Take 1 tablet (20 mg total) by mouth daily. 02/21/23   Georgina Quint, MD  empagliflozin (JARDIANCE) 25 MG TABS tablet Take 1 tablet (25 mg total) by mouth daily. 02/21/23   Georgina Quint, MD  glucose blood (TRUE METRIX BLOOD GLUCOSE TEST) test strip Use as instructed. Check  blood glucose level by fingerstick twice per day.  E11.65 11/22/19   Claiborne Rigg, NP  HYDROcodone bit-homatropine (HYCODAN) 5-1.5 MG/5ML syrup Take 5 mLs by mouth at bedtime as needed for cough. 02/28/23   Georgina Quint, MD  ibuprofen (ADVIL) 800 MG tablet Take 1 tablet (800 mg total) by mouth every 8 (eight) hours as needed. 05/25/21   Steva Ready, DO  lisinopril (ZESTRIL) 20 MG tablet Take 1 tablet (20 mg total) by mouth daily. 02/21/23   Georgina Quint, MD  loperamide (IMODIUM A-D) 2 MG tablet Take 1 tablet (2 mg total) by mouth as needed for diarrhea or loose stools. Take after loose stool. Do not take more than 8 doses in a day. 10/04/22   Gustavus Bryant, FNP  omeprazole (PRILOSEC) 20 MG capsule Take 1 capsule (20 mg total) by mouth 2 (two) times daily before a meal. Patient taking differently:  Take 20 mg by mouth as needed. 02/12/20 03/08/22  Claiborne Rigg, NP  ondansetron (ZOFRAN-ODT) 4 MG disintegrating tablet Dissolve 1 tablet (4 mg total) by mouth every 8 (eight) hours as needed for nausea or vomiting. 03/08/22   Armbruster, Willaim Rayas, MD  TRUEplus Lancets 28G MISC Use as instructed. Check blood glucose level by fingerstick twice per day. 11/22/19   Claiborne Rigg, NP  fluticasone (FLONASE) 50 MCG/ACT nasal spray Place 1 spray into both nostrils daily. Patient not taking: Reported on 03/18/2020 01/25/20 12/15/20  Claiborne Rigg, NP    Family History Family History  Problem Relation Age of Onset   Aneurysm Mother    Heart disease Mother    Diabetes Mother    Hypertension Mother    Colon cancer Father        colon   Diabetes Sister    Kidney failure Sister    Heart failure Sister    Hodgkin's lymphoma Sister    Diabetes Brother    Heart disease Brother    Congestive Heart Failure Brother    Heart disease Brother    Diabetes Brother    Pancreatitis Brother    Heart failure Brother    Diabetes Maternal Grandmother    Hypertension Maternal Grandmother    Congestive Heart Failure Maternal Grandmother    Heart disease Paternal Grandmother    Hypertension Paternal Grandmother    Stroke Paternal Grandfather    Ovarian cancer Maternal Aunt        2021   Colon cancer Maternal Aunt    Esophageal cancer Paternal Aunt    Hodgkin's lymphoma Niece    Diabetes Daughter    Other Daughter        pre diabetic   Kidney failure Nephew    Diabetes Nephew    Breast cancer Cousin    Colon polyps Neg Hx    Stomach cancer Neg Hx     Social History Social History   Tobacco Use   Smoking status: Never   Smokeless tobacco: Never  Vaping Use   Vaping Use: Never used  Substance Use Topics   Alcohol use: No   Drug use: No     Allergies   Iodine, Ivp dye [iodinated contrast media], and Shellfish allergy   Review of Systems Review of Systems Per HPI  Physical  Exam Triage Vital Signs ED Triage Vitals  Enc Vitals Group     BP 04/13/23 1600 (!) 162/91     Pulse Rate 04/13/23 1600 73     Resp 04/13/23 1600 16  Temp 04/13/23 1600 98.1 F (36.7 C)     Temp Source 04/13/23 1600 Oral     SpO2 04/13/23 1600 94 %     Weight --      Height --      Head Circumference --      Peak Flow --      Pain Score 04/13/23 1603 7     Pain Loc --      Pain Edu? --      Excl. in GC? --    No data found.  Updated Vital Signs BP (!) 162/91 (BP Location: Right Arm)   Pulse 73   Temp 98.1 F (36.7 C) (Oral)   Resp 16   LMP  (LMP Unknown) Comment: irregular  SpO2 94%   Visual Acuity Right Eye Distance:   Left Eye Distance:   Bilateral Distance:    Right Eye Near:   Left Eye Near:    Bilateral Near:     Physical Exam Exam conducted with a chaperone present.  Constitutional:      General: She is not in acute distress.    Appearance: Normal appearance. She is not toxic-appearing or diaphoretic.  HENT:     Head: Normocephalic and atraumatic.  Eyes:     Extraocular Movements: Extraocular movements intact.     Conjunctiva/sclera: Conjunctivae normal.  Cardiovascular:     Rate and Rhythm: Normal rate and regular rhythm.     Pulses: Normal pulses.     Heart sounds: Normal heart sounds.  Pulmonary:     Effort: Pulmonary effort is normal. No respiratory distress.     Breath sounds: Normal breath sounds.  Abdominal:     General: Bowel sounds are normal. There is no distension.     Palpations: Abdomen is soft.     Tenderness: There is no abdominal tenderness.  Genitourinary:    Comments: Questionable uterine prolapse noted. Musculoskeletal:       Back:     Comments: Tenderness to palpation to left lower lumbar/flank area.  Neurological:     General: No focal deficit present.     Mental Status: She is alert and oriented to person, place, and time. Mental status is at baseline.  Psychiatric:        Mood and Affect: Mood normal.         Behavior: Behavior normal.        Thought Content: Thought content normal.        Judgment: Judgment normal.      UC Treatments / Results  Labs (all labs ordered are listed, but only abnormal results are displayed) Labs Reviewed - No data to display  EKG   Radiology No results found.  Procedures Procedures (including critical care time)  Medications Ordered in UC Medications - No data to display  Initial Impression / Assessment and Plan / UC Course  I have reviewed the triage vital signs and the nursing notes.  Pertinent labs & imaging results that were available during my care of the patient were reviewed by me and considered in my medical decision making (see chart for details).     I am concerned for possible uterine versus bladder prolapse.  I am very concerned for complications given back pain and abdominal pain started after patient noticed protrusion.  Therefore, I do think that patient needs a more extensive evaluation than can be provided here in urgent care so patient was advised to go to the emergency department today.  She was agreeable with  this plan.  Vital signs stable at discharge.  Agree with patient self transport to the ER. Final Clinical Impressions(s) / UC Diagnoses   Final diagnoses:  Acute left-sided low back pain without sciatica  Lower abdominal pain     Discharge Instructions      Please go to the emergency department as soon as you leave urgent care for further evaluation and management.     ED Prescriptions   None    PDMP not reviewed this encounter.   Gustavus Bryant, [place] 04/13/23 [phone]

## 2023-04-13 NOTE — ED Notes (Signed)
Patient is being discharged from the Urgent Care and sent to the Emergency Department via POV . Per Laren Everts, FNP, patient is in need of higher level of care due to abdominal pain. Patient is aware and verbalizes understanding of plan of care.  Vitals:   04/13/23 1600  BP: (!) 162/91  Pulse: 73  Resp: 16  Temp: 98.1 F (36.7 C)  SpO2: 94%

## 2023-04-13 NOTE — Discharge Instructions (Signed)
Please go to the emergency department as soon as you leave urgent care for further evaluation and management. ?

## 2023-04-14 LAB — WET PREP, GENITAL
Clue Cells Wet Prep HPF POC: NONE SEEN
Sperm: NONE SEEN
Trich, Wet Prep: NONE SEEN
WBC, Wet Prep HPF POC: >=10 — AB (ref <10)
Yeast Wet Prep HPF POC: NONE SEEN

## 2023-04-14 MED ORDER — LIDOCAINE 5 % EX PTCH: 1.0000 | MEDICATED_PATCH | CUTANEOUS | 0 refills | Status: DC

## 2023-04-14 MED ORDER — OXYCODONE HCL 5 MG PO TABS: 5.0000 mg | ORAL_TABLET | Freq: Four times a day (QID) | ORAL | 0 refills | Status: DC | PRN

## 2023-04-14 MED ORDER — HYDROMORPHONE HCL 1 MG/ML IJ SOLN
1.0000 mg | Freq: Once | INTRAMUSCULAR | Status: AC
  Administered 2023-04-14: 1 mg via INTRAMUSCULAR
  Filled 2023-04-14: qty 1

## 2023-04-14 MED ORDER — ONDANSETRON 4 MG PO TBDP
8.0000 mg | ORAL_TABLET | Freq: Once | ORAL | Status: AC
  Administered 2023-04-14: 8 mg via ORAL
  Filled 2023-04-14: qty 2

## 2023-04-14 MED ORDER — IBUPROFEN 600 MG PO TABS: 600.0000 mg | ORAL_TABLET | Freq: Four times a day (QID) | ORAL | 0 refills | Status: DC | PRN

## 2023-04-14 NOTE — Discharge Instructions (Signed)
Your workup today is overall reassuring.  No concerning cause of your low back pain.  On exam I did not appreciate any prolapse.  However I will give you a referral to women's clinic.  Your pain is likely musculoskeletal.  I have sent in short course of pain medication for severe breakthrough pain.  I primarily want you to utilize 600 mg of ibuprofen every 6-8 hours, combine this with Tylenol 1000 mg every 8 hours.  I have also sent in lidocaine patch.  For any concerning symptoms return to the emergency department.

## 2023-04-14 NOTE — ED Provider Notes (Signed)
[place] EMERGENCY DEPARTMENT AT [facility] Provider Note   CSN: [id number] Arrival date & time: 04/13/23  1745     History  Chief Complaint  Patient presents with   Back Pain   Abdominal Pain    Anna Mann is a 62 y.o. female.  62 year old female presents today for evaluation of low back pain that radiates to her groin.  She states this started about 2 weeks ago however resolved and did not return until earlier this week.  Denies any injury to her back.  She states that she feels like she has a prolapse but is unsure if this is uterine prolapse, or bladder prolapse.  She states she has been feeling this for at least a month, and had similar sensation during her pregnancy as well.  Does not follow with gynecologist or urologist.  She denies any dysuria, hematuria, vaginal bleeding.  Pain is bilateral.  No prior history of kidney stones.  Pain was most severe today.  She states she had to leave work.  She has taken oxycodone, and ibuprofen.  Has had some improvement in pain.  She is without bowel or bladder dysfunction, fever, saddle anesthesia, history of malignancy, or IV drug use history.  The history is provided by the patient and medical records. No language interpreter was used.       Home Medications Prior to Admission medications   Medication Sig Start Date End Date Taking? Authorizing Provider  atorvastatin (LIPITOR) 20 MG tablet Take 1 tablet (20 mg total) by mouth daily. 02/21/23   Georgina Quint, MD  empagliflozin (JARDIANCE) 25 MG TABS tablet Take 1 tablet (25 mg total) by mouth daily. 02/21/23   Georgina Quint, MD  glucose blood (TRUE METRIX BLOOD GLUCOSE TEST) test strip Use as instructed. Check blood glucose level by fingerstick twice per day.  E11.65 11/22/19   Claiborne Rigg, NP  HYDROcodone bit-homatropine (HYCODAN) 5-1.5 MG/5ML syrup Take 5 mLs by mouth at bedtime as needed for cough. 02/28/23   Georgina Quint, MD  ibuprofen  (ADVIL) 800 MG tablet Take 1 tablet (800 mg total) by mouth every 8 (eight) hours as needed. 05/25/21   Steva Ready, DO  lisinopril (ZESTRIL) 20 MG tablet Take 1 tablet (20 mg total) by mouth daily. 02/21/23   Georgina Quint, MD  loperamide (IMODIUM A-D) 2 MG tablet Take 1 tablet (2 mg total) by mouth as needed for diarrhea or loose stools. Take after loose stool. Do not take more than 8 doses in a day. 10/04/22   Gustavus Bryant, FNP  omeprazole (PRILOSEC) 20 MG capsule Take 1 capsule (20 mg total) by mouth 2 (two) times daily before a meal. Patient taking differently: Take 20 mg by mouth as needed. 02/12/20 03/08/22  Claiborne Rigg, NP  ondansetron (ZOFRAN-ODT) 4 MG disintegrating tablet Dissolve 1 tablet (4 mg total) by mouth every 8 (eight) hours as needed for nausea or vomiting. 03/08/22   Armbruster, Willaim Rayas, MD  TRUEplus Lancets 28G MISC Use as instructed. Check blood glucose level by fingerstick twice per day. 11/22/19   Claiborne Rigg, NP  fluticasone (FLONASE) 50 MCG/ACT nasal spray Place 1 spray into both nostrils daily. Patient not taking: Reported on 03/18/2020 01/25/20 12/15/20  Claiborne Rigg, NP      Allergies    Iodine, Ivp dye [iodinated contrast media], and Shellfish allergy    Review of Systems   Review of Systems  Constitutional:  Negative for fever.  Gastrointestinal:  Positive for abdominal pain. Negative for nausea and vomiting.  Genitourinary:  Positive for flank pain. Negative for frequency, pelvic pain and vaginal bleeding.  Musculoskeletal:  Positive for back pain.  All other systems reviewed and are negative.   Physical Exam Updated Vital Signs BP (!) 159/56 (BP Location: Right Wrist)   Pulse 66   Temp 98.2 F (36.8 C) (Oral)   Resp 14   Ht 5\' 3"  (1.6 m)   Wt 108.9 kg   LMP  (LMP Unknown) Comment: irregular  SpO2 100%   BMI 42.51 kg/m  Physical Exam Vitals and nursing note reviewed.  Constitutional:      General: She is not in acute  distress.    Appearance: Normal appearance. She is not ill-appearing.  HENT:     Head: Normocephalic and atraumatic.     Nose: Nose normal.  Eyes:     General: No scleral icterus.    Extraocular Movements: Extraocular movements intact.     Conjunctiva/sclera: Conjunctivae normal.  Cardiovascular:     Rate and Rhythm: Normal rate and regular rhythm.     Pulses: Normal pulses.  Pulmonary:     Effort: Pulmonary effort is normal. No respiratory distress.     Breath sounds: Normal breath sounds. No wheezing or rales.  Abdominal:     General: There is no distension.     Palpations: Abdomen is soft.     Tenderness: There is no abdominal tenderness. There is no right CVA tenderness, left CVA tenderness, guarding or rebound.  Musculoskeletal:        General: Normal range of motion.     Cervical back: Normal range of motion.  Skin:    General: Skin is warm and dry.  Neurological:     General: No focal deficit present.     Mental Status: She is alert. Mental status is at baseline.     ED Results / Procedures / Treatments   Labs (all labs ordered are listed, but only abnormal results are displayed) Labs Reviewed  COMPREHENSIVE METABOLIC PANEL - Abnormal; Notable for the following components:      Result Value   Glucose, Bld 161 (*)    All other components within normal limits  URINALYSIS, ROUTINE W REFLEX MICROSCOPIC - Abnormal; Notable for the following components:   Glucose, UA >=500 (*)    Leukocytes,Ua SMALL (*)    All other components within normal limits  LIPASE, BLOOD  CBC    EKG None  Radiology No results found.  Procedures Procedures    Medications Ordered in ED Medications - No data to display  ED Course/ Medical Decision Making/ A&P Clinical Course as of 04/14/23 0500  Sat Apr 14, 2023  0443 WBC, Wet Prep HPF POC(!): >=10 [AA]    Clinical Course User Index [AA] Marita Kansas, PA-C                             Medical Decision Making Amount and/or  Complexity of Data Reviewed Labs: ordered. Decision-making details documented in ED Course. Radiology: ordered.  Risk Prescription drug management.   62 year old female presents today for evaluation of low back pain that radiates to her groin.  Ongoing for about 2 weeks intermittently however worse in the past several days.  More so today.  Improved after ibuprofen which she took this morning.  Does feel prolapse likely bladder given she has history of this during pregnancy.  No other concerning  features.  Pain did improve after lidocaine patch as well that she applied this morning.  It is worse with palpation.  No red flag signs or symptoms concerning for cauda equina syndrome, or spinal epidural abscess.  CBC is unremarkable, CMP is unremarkable with the exception of glucose of 161.  Lipase within normal limits.  UA without evidence of UTI.  CT abdomen pelvis without contrast obtained due to contrast allergy which shows no acute intra-abdominal or pelvic findings.  Patient would like to obtain a pelvic.  Will obtain this.  For that she will need a referral to gynecology.  Pelvic exam without obvious prolapse.  Nurse tech present as chaperone.  Minimal discharge noted.  Wet prep obtained.  Outside of greater than 10 WBCs no acute findings.  Given she has not been sexually active since 2007.  No suspicion for STI.  Likely MSK in nature.  Symptomatic management discussed.  Patient voices understanding and is in agreement with plan.  Case discussed with attending.  Final Clinical Impression(s) / ED Diagnoses Final diagnoses:  Acute bilateral low back pain without sciatica    Rx / DC Orders ED Discharge Orders          Ordered    oxyCODONE (ROXICODONE) 5 MG immediate release tablet  Every 6 hours PRN        04/14/23 0453    lidocaine (LIDODERM) 5 %  Every 24 hours        04/14/23 0453              Marita Kansas, PA-C 04/14/23 0501    Sloan Leiter, DO 04/14/23 [phone]

## 2023-04-19 ENCOUNTER — Encounter: Payer: Self-pay | Admitting: Emergency Medicine

## 2023-04-19 ENCOUNTER — Other Ambulatory Visit: Payer: Self-pay | Admitting: Emergency Medicine

## 2023-04-19 ENCOUNTER — Other Ambulatory Visit: Payer: Self-pay

## 2023-04-19 ENCOUNTER — Other Ambulatory Visit (HOSPITAL_COMMUNITY): Payer: Self-pay

## 2023-04-19 DIAGNOSIS — M545 Low back pain, unspecified: Secondary | ICD-10-CM

## 2023-04-19 MED ORDER — METHYLPREDNISOLONE 4 MG PO TBPK
ORAL_TABLET | ORAL | 1 refills | Status: DC
  Filled 2023-04-19: qty 21, 6d supply, fill #0

## 2023-04-19 MED ORDER — CYCLOBENZAPRINE HCL 10 MG PO TABS
10.0000 mg | ORAL_TABLET | Freq: Three times a day (TID) | ORAL | 0 refills | Status: DC | PRN
Start: 2023-04-19 — End: 2024-09-17
  Filled 2023-04-19: qty 30, 10d supply, fill #0

## 2023-04-19 NOTE — Telephone Encounter (Signed)
Please advise on medication requested. Patient wants medication sent to [facility] on [address]

## 2023-04-20 ENCOUNTER — Other Ambulatory Visit: Payer: Self-pay

## 2023-04-23 ENCOUNTER — Encounter: Payer: Self-pay | Admitting: Family Medicine

## 2023-04-23 ENCOUNTER — Ambulatory Visit: Payer: Medicaid Other | Admitting: Emergency Medicine

## 2023-04-23 ENCOUNTER — Ambulatory Visit (INDEPENDENT_AMBULATORY_CARE_PROVIDER_SITE_OTHER): Payer: Self-pay | Admitting: Family Medicine

## 2023-04-23 ENCOUNTER — Other Ambulatory Visit (HOSPITAL_COMMUNITY)
Admission: RE | Admit: 2023-04-23 | Discharge: 2023-04-23 | Disposition: A | Discharge status: Home / Self Care | Payer: Medicaid Other | Source: Ambulatory Visit | Attending: Family Medicine | Admitting: Family Medicine

## 2023-04-23 VITALS — BP 138/88 | HR 80 | Temp 98.2°F | Resp 20 | Ht 63.0 in | Wt 240.0 lb

## 2023-04-23 DIAGNOSIS — N760 Acute vaginitis: Secondary | ICD-10-CM

## 2023-04-23 DIAGNOSIS — Z113 Encounter for screening for infections with a predominantly sexual mode of transmission: Secondary | ICD-10-CM | POA: Diagnosis present

## 2023-04-23 DIAGNOSIS — N898 Other specified noninflammatory disorders of vagina: Secondary | ICD-10-CM

## 2023-04-23 DIAGNOSIS — H6502 Acute serous otitis media, left ear: Secondary | ICD-10-CM

## 2023-04-23 DIAGNOSIS — N814 Uterovaginal prolapse, unspecified: Secondary | ICD-10-CM

## 2023-04-23 DIAGNOSIS — B9689 Other specified bacterial agents as the cause of diseases classified elsewhere: Secondary | ICD-10-CM

## 2023-04-23 DIAGNOSIS — B3731 Acute candidiasis of vulva and vagina: Secondary | ICD-10-CM

## 2023-04-23 MED ORDER — AMOXICILLIN 500 MG PO CAPS
500.0000 mg | ORAL_CAPSULE | Freq: Two times a day (BID) | ORAL | 0 refills | Status: AC
Start: 2023-04-23 — End: 2023-05-03

## 2023-04-23 NOTE — Progress Notes (Signed)
Assessment & Plan:  1. Non-recurrent acute serous otitis media of left ear Education on otitis media.  Encouraged symptom management including throat lozenges, chloraseptic spray, warm salt water gargles, hot tea/honey, cough syrup (Delsym), Tylenol/Ibuprofen, Vicks, and a humidifier at night.  - amoxicillin (AMOXIL) 500 MG capsule; Take 1 capsule (500 mg total) by mouth 2 (two) times daily for 10 days.  Dispense: 20 capsule; Refill: 0  2. Uterine prolapse - Ambulatory referral to Obstetrics / Gynecology  3. Vaginal discharge - Cervicovaginal ancillary only - HCV RNA quant rflx ultra or genotyp - Hepatitis B surface antigen - HIV Antibody (routine testing w rflx) - RPR  4. Routine screening for STI (sexually transmitted infection) - Cervicovaginal ancillary only - HCV RNA quant rflx ultra or genotyp - Hepatitis B surface antigen - HIV Antibody (routine testing w rflx) - RPR    Follow up plan: Return if symptoms worsen or fail to improve.  Deliah Boston, MSN, APRN, FNP-C  Subjective:  HPI: Anna Mann is a 62 y.o. female presenting on 04/23/2023 for Sinusitis (Started over the weekend: cough, congestion, facial pressure and drainage, also dizzy when waking at times. ) and Back Pain (Follow up on back pain: seen PCP 5/9 and also went to ER, had CT done and thinks now needs referral to GYN )  Patient complains of cough, head congestion, headache, runny nose, sneezing, sore throat, facial pain/pressure, postnasal drainage, and dizziness . She denies chest congestion, fever, shortness of breath, and wheezing. Onset of symptoms was 2 days ago, gradually worsening since that time. She is drinking plenty of fluids. Evaluation to date: none. Treatment to date:  Mucinex, steroids, and Vicks .  She does not smoke.   She is concerned about vaginal discharge that was not addressed in the ER on 04/13/2023.  She did have a wet prep completed that was negative for yeast, trichomoniasis, and  clue cells; it did show >/= 10 WBC.  She is also requesting a referral to a GYN for her uterine prolapse.    ROS: Negative unless specifically indicated above in HPI.   Relevant past medical history reviewed and updated as indicated.   Allergies and medications reviewed and updated.   Current Outpatient Medications:    atorvastatin (LIPITOR) 20 MG tablet, Take 1 tablet (20 mg total) by mouth daily., Disp: 90 tablet, Rfl: 3   cyclobenzaprine (FLEXERIL) 10 MG tablet, Take 1 tablet (10 mg total) by mouth 3 (three) times daily as needed for muscle spasms., Disp: 30 tablet, Rfl: 0   empagliflozin (JARDIANCE) 25 MG TABS tablet, Take 1 tablet (25 mg total) by mouth daily., Disp: 90 tablet, Rfl: 3   glucose blood (TRUE METRIX BLOOD GLUCOSE TEST) test strip, Use as instructed. Check blood glucose level by fingerstick twice per day.  E11.65, Disp: 100 each, Rfl: 12   ibuprofen (ADVIL) 600 MG tablet, Take 1 tablet (600 mg total) by mouth every 6 (six) hours as needed., Disp: 30 tablet, Rfl: 0   lidocaine (LIDODERM) 5 %, Place 1 patch onto the skin daily. Remove & Discard patch within 12 hours or as directed by MD, Disp: 14 patch, Rfl: 0   lisinopril (ZESTRIL) 20 MG tablet, Take 1 tablet (20 mg total) by mouth daily., Disp: 90 tablet, Rfl: 3   methylPREDNISolone (MEDROL DOSEPAK) 4 MG TBPK tablet, Take as directed., Disp: 21 tablet, Rfl: 1   omeprazole (PRILOSEC) 20 MG capsule, Take 1 capsule (20 mg total) by mouth 2 (two) times daily before  a meal. (Patient taking differently: Take 20 mg by mouth as needed.), Disp: 180 capsule, Rfl: 1   oxyCODONE (ROXICODONE) 5 MG immediate release tablet, Take 1 tablet (5 mg total) by mouth every 6 (six) hours as needed for severe pain or breakthrough pain., Disp: 8 tablet, Rfl: 0   TRUEplus Lancets 28G MISC, Use as instructed. Check blood glucose level by fingerstick twice per day., Disp: 100 each, Rfl: 3   ondansetron (ZOFRAN-ODT) 4 MG disintegrating tablet, Dissolve  1 tablet (4 mg total) by mouth every 8 (eight) hours as needed for nausea or vomiting. (Patient not taking: Reported on 04/23/2023), Disp: 30 tablet, Rfl: 1  Allergies  Allergen Reactions   Iodine    Ivp Dye [Iodinated Contrast Media]    Shellfish Allergy     Objective:   BP 138/88   Pulse 80   Temp 98.2 F (36.8 C)   Resp 20   Ht 5\' 3"  (1.6 m)   Wt 240 lb (108.9 kg)   LMP  (LMP Unknown) Comment: irregular  BMI 42.51 kg/m    Physical Exam Vitals reviewed.  Constitutional:      General: She is not in acute distress.    Appearance: Normal appearance. She is not ill-appearing, toxic-appearing or diaphoretic.  HENT:     Head: Normocephalic and atraumatic.     Right Ear: Tympanic membrane, ear canal and external ear normal. There is no impacted cerumen.     Left Ear: Ear canal and external ear normal.  No middle ear effusion. There is no impacted cerumen. Tympanic membrane is erythematous and bulging.     Nose: Congestion present. No rhinorrhea.     Right Sinus: No maxillary sinus tenderness or frontal sinus tenderness.     Left Sinus: No maxillary sinus tenderness or frontal sinus tenderness.     Mouth/Throat:     Mouth: Mucous membranes are moist.     Pharynx: Oropharynx is clear. Posterior oropharyngeal erythema present. No oropharyngeal exudate.  Eyes:     General: No scleral icterus.       Right eye: No discharge.        Left eye: No discharge.     Conjunctiva/sclera: Conjunctivae normal.  Cardiovascular:     Rate and Rhythm: Normal rate and regular rhythm.     Heart sounds: Normal heart sounds. No murmur heard.    No friction rub. No gallop.  Pulmonary:     Effort: Pulmonary effort is normal. No respiratory distress.     Breath sounds: Normal breath sounds. No stridor. No wheezing, rhonchi or rales.  Musculoskeletal:        General: Normal range of motion.     Cervical back: Normal range of motion.  Lymphadenopathy:     Cervical: No cervical adenopathy.  Skin:     General: Skin is warm and dry.     Capillary Refill: Capillary refill takes less than 2 seconds.  Neurological:     General: No focal deficit present.     Mental Status: She is alert and oriented to person, place, and time. Mental status is at baseline.  Psychiatric:        Mood and Affect: Mood normal.        Behavior: Behavior normal.        Thought Content: Thought content normal.        Judgment: Judgment normal.

## 2023-04-24 LAB — HCV RNA QUANT RFLX ULTRA OR GENOTYP: HCV Quant Baseline: NOT DETECTED IU/mL

## 2023-04-24 LAB — CERVICOVAGINAL ANCILLARY ONLY
Bacterial Vaginitis (gardnerella): POSITIVE — AB
Candida Glabrata: POSITIVE — AB
Candida Vaginitis: NEGATIVE
Chlamydia: NEGATIVE
Comment: NEGATIVE
Comment: NEGATIVE
Comment: NEGATIVE
Comment: NEGATIVE
Comment: NEGATIVE
Comment: NORMAL
Neisseria Gonorrhea: NEGATIVE
Trichomonas: NEGATIVE

## 2023-04-24 LAB — HEPATITIS B SURFACE ANTIGEN: Hepatitis B Surface Ag: NONREACTIVE

## 2023-04-24 LAB — HIV ANTIBODY (ROUTINE TESTING W REFLEX): HIV 1&2 Ab, 4th Generation: NONREACTIVE

## 2023-04-24 LAB — RPR: RPR Ser Ql: NONREACTIVE

## 2023-04-25 MED ORDER — METRONIDAZOLE 500 MG PO TABS
500.0000 mg | ORAL_TABLET | Freq: Two times a day (BID) | ORAL | 0 refills | Status: AC
Start: 2023-04-25 — End: 2023-05-02

## 2023-04-25 MED ORDER — FLUCONAZOLE 150 MG PO TABS
150.0000 mg | ORAL_TABLET | Freq: Once | ORAL | 0 refills | Status: AC
Start: 2023-04-25 — End: 2023-04-25

## 2023-04-25 NOTE — Addendum Note (Signed)
Addended by: Deliah Boston F on: 04/25/2023 10:59 AM   Modules accepted: Orders

## 2023-06-08 ENCOUNTER — Other Ambulatory Visit (HOSPITAL_COMMUNITY): Payer: Self-pay

## 2023-06-13 ENCOUNTER — Encounter: Payer: Self-pay | Admitting: Emergency Medicine

## 2023-06-13 ENCOUNTER — Other Ambulatory Visit (HOSPITAL_COMMUNITY): Payer: Self-pay

## 2023-06-13 NOTE — Telephone Encounter (Signed)
Okay for samples?

## 2023-06-18 ENCOUNTER — Other Ambulatory Visit (HOSPITAL_COMMUNITY): Payer: Self-pay

## 2023-06-18 ENCOUNTER — Encounter: Payer: Self-pay | Admitting: Emergency Medicine

## 2023-06-20 ENCOUNTER — Other Ambulatory Visit: Payer: Self-pay | Admitting: Emergency Medicine

## 2023-06-20 MED ORDER — GLIPIZIDE 5 MG PO TABS: 5.0000 mg | ORAL_TABLET | Freq: Two times a day (BID) | ORAL | 3 refills | Status: DC

## 2023-06-20 NOTE — Telephone Encounter (Signed)
Glipizide 5 mg twice a day is inexpensive. It is not the same as Jardiance but will do. New prescription sent to pharmacy of record today.

## 2023-07-10 ENCOUNTER — Encounter: Payer: Self-pay | Admitting: Family Medicine

## 2023-07-19 ENCOUNTER — Encounter: Payer: Self-pay | Admitting: Obstetrics and Gynecology

## 2023-07-19 ENCOUNTER — Telehealth: Payer: Self-pay | Admitting: Emergency Medicine

## 2023-07-19 NOTE — Telephone Encounter (Signed)
Pt spoke very highly about Dr. Alvy Bimler but she is unable to be seen at this practice any longer due she has enroll in a program that help with her medication. Pt also stated when she get insurance she will be back to see Dr. Alvy Bimler.

## 2023-07-26 ENCOUNTER — Encounter (INDEPENDENT_AMBULATORY_CARE_PROVIDER_SITE_OTHER): Payer: Self-pay

## 2023-08-09 ENCOUNTER — Other Ambulatory Visit (HOSPITAL_COMMUNITY): Payer: Self-pay

## 2023-08-15 ENCOUNTER — Ambulatory Visit: Payer: Medicaid Other | Admitting: Emergency Medicine

## 2023-08-21 ENCOUNTER — Encounter: Payer: Medicaid Other | Admitting: Obstetrics and Gynecology

## 2024-01-25 ENCOUNTER — Other Ambulatory Visit (HOSPITAL_COMMUNITY): Payer: Self-pay

## 2024-02-06 ENCOUNTER — Other Ambulatory Visit (HOSPITAL_COMMUNITY): Payer: Self-pay

## 2024-03-13 ENCOUNTER — Ambulatory Visit: Admitting: Emergency Medicine

## 2024-03-19 ENCOUNTER — Ambulatory Visit: Payer: Self-pay

## 2024-03-19 ENCOUNTER — Ambulatory Visit: Admitting: Emergency Medicine

## 2024-03-19 NOTE — Telephone Encounter (Signed)
  Chief Complaint: dizziness Symptoms: dizziness when looking up, left side of head feels fuzzy Frequency: SAt Pertinent Negatives: Patient denies weakness or numbness to either side of face, arms , legs Disposition: [] ED /[] Urgent Care (no appt availability in office) / [x] Appointment(In office/virtual)/ []  [facility]/ [] Home Care/ [] Refused Recommended Disposition /[] [facility]/ []  Follow-up with PCP Additional Notes:  Copied from CRM [id number]. Topic: Clinical - Red Word Triage >> Mar 19, 2024  8:54 AM Anna Mann wrote: Red Word that prompted transfer to Nurse Triage: Stomach issues ; left side of head feels weird (dizzy like symptoms) Reason for Disposition  [1] MILD dizziness (e.g., walking normally) AND [2] has been evaluated by doctor (or NP/PA) for this  Answer Assessment - Initial Assessment Questions 1. DESCRIPTION: "Describe your dizziness."     fuzziness 2. LIGHTHEADED: "Do you feel lightheaded?" (e.g., somewhat faint, woozy, weak upon standing)     Worse if looking up 3. VERTIGO: "Do you feel like either you or the room is spinning or tilting?" (i.e. vertigo)     no 4. SEVERITY: "How bad is it?"  "Do you feel like you are going to faint?" "Can you stand and walk?"   - MILD: Feels slightly dizzy, but walking normally.   - MODERATE: Feels unsteady when walking, but not falling; interferes with normal activities (e.g., school, work).   - SEVERE: Unable to walk without falling, or requires assistance to walk without falling; feels like passing out now.      Mild  5. ONSET:  "When did the dizziness begin?"     Weekend  6. AGGRAVATING FACTORS: "Does anything make it worse?" (e.g., standing, change in head position)     Looking up 8. CAUSE: "What do you think is causing the dizziness?"     Unsure  9. RECURRENT SYMPTOM: "Have you had dizziness before?" If Yes, ask: "When was the last time?" "What happened that time?"     no 10. OTHER SYMPTOMS: "Do you have  any other symptoms?" (e.g., fever, chest pain, vomiting, diarrhea, bleeding)       Stomach nausea, loud gurgling sounds / left side of head feel fuzzy, nausea/ left hand felt like it was swollen but not swollen  Protocols used: Dizziness - Lightheadedness-A-AH

## 2024-04-23 ENCOUNTER — Ambulatory Visit: Admitting: Emergency Medicine

## 2024-05-08 ENCOUNTER — Other Ambulatory Visit (HOSPITAL_BASED_OUTPATIENT_CLINIC_OR_DEPARTMENT_OTHER): Payer: Self-pay | Admitting: Emergency Medicine

## 2024-05-08 DIAGNOSIS — Z1231 Encounter for screening mammogram for malignant neoplasm of breast: Secondary | ICD-10-CM

## 2024-05-13 ENCOUNTER — Encounter (HOSPITAL_BASED_OUTPATIENT_CLINIC_OR_DEPARTMENT_OTHER): Payer: Self-pay | Admitting: Radiology

## 2024-05-13 ENCOUNTER — Ambulatory Visit (HOSPITAL_BASED_OUTPATIENT_CLINIC_OR_DEPARTMENT_OTHER)
Admission: RE | Admit: 2024-05-13 | Discharge: 2024-05-13 | Disposition: A | Discharge status: Home / Self Care | Payer: Self-pay | Source: Ambulatory Visit | Attending: Emergency Medicine | Admitting: Emergency Medicine

## 2024-05-13 DIAGNOSIS — Z1231 Encounter for screening mammogram for malignant neoplasm of breast: Secondary | ICD-10-CM | POA: Insufficient documentation

## 2024-05-16 ENCOUNTER — Ambulatory Visit: Payer: Self-pay | Admitting: Emergency Medicine

## 2024-05-19 ENCOUNTER — Other Ambulatory Visit: Payer: Self-pay | Admitting: Emergency Medicine

## 2024-05-19 DIAGNOSIS — R928 Other abnormal and inconclusive findings on diagnostic imaging of breast: Secondary | ICD-10-CM

## 2024-05-20 ENCOUNTER — Other Ambulatory Visit: Payer: Self-pay

## 2024-05-20 DIAGNOSIS — N6310 Unspecified lump in the right breast, unspecified quadrant: Secondary | ICD-10-CM

## 2024-05-30 NOTE — Progress Notes (Signed)
 Orders Canceled. Patient has insurance- Database administrator). Verified in [place] [employer].

## 2024-05-30 NOTE — Addendum Note (Signed)
 Addended by: Algie Ingle on: 05/30/2024 11:01 AM   Modules accepted: Orders

## 2024-06-02 ENCOUNTER — Encounter (HOSPITAL_COMMUNITY): Payer: Self-pay

## 2024-06-02 ENCOUNTER — Other Ambulatory Visit (HOSPITAL_COMMUNITY): Payer: Self-pay

## 2024-06-02 ENCOUNTER — Other Ambulatory Visit: Payer: Self-pay | Admitting: Emergency Medicine

## 2024-06-02 DIAGNOSIS — E1159 Type 2 diabetes mellitus with other circulatory complications: Secondary | ICD-10-CM

## 2024-06-02 DIAGNOSIS — E1165 Type 2 diabetes mellitus with hyperglycemia: Secondary | ICD-10-CM

## 2024-06-02 DIAGNOSIS — E1169 Type 2 diabetes mellitus with other specified complication: Secondary | ICD-10-CM

## 2024-06-03 ENCOUNTER — Encounter: Payer: Self-pay | Admitting: Emergency Medicine

## 2024-06-03 ENCOUNTER — Ambulatory Visit: Payer: Self-pay | Admitting: Emergency Medicine

## 2024-06-03 ENCOUNTER — Telehealth: Payer: Self-pay

## 2024-06-03 ENCOUNTER — Ambulatory Visit (INDEPENDENT_AMBULATORY_CARE_PROVIDER_SITE_OTHER): Admitting: Emergency Medicine

## 2024-06-03 ENCOUNTER — Other Ambulatory Visit (HOSPITAL_COMMUNITY): Payer: Self-pay

## 2024-06-03 VITALS — BP 138/28 | HR 67 | Temp 98.3°F | Ht 63.0 in | Wt 245.0 lb

## 2024-06-03 DIAGNOSIS — I152 Hypertension secondary to endocrine disorders: Secondary | ICD-10-CM | POA: Diagnosis not present

## 2024-06-03 DIAGNOSIS — E1169 Type 2 diabetes mellitus with other specified complication: Secondary | ICD-10-CM | POA: Diagnosis not present

## 2024-06-03 DIAGNOSIS — E1165 Type 2 diabetes mellitus with hyperglycemia: Secondary | ICD-10-CM | POA: Diagnosis not present

## 2024-06-03 DIAGNOSIS — E785 Hyperlipidemia, unspecified: Secondary | ICD-10-CM | POA: Diagnosis not present

## 2024-06-03 DIAGNOSIS — E1159 Type 2 diabetes mellitus with other circulatory complications: Secondary | ICD-10-CM | POA: Diagnosis not present

## 2024-06-03 DIAGNOSIS — Z7984 Long term (current) use of oral hypoglycemic drugs: Secondary | ICD-10-CM

## 2024-06-03 LAB — COMPREHENSIVE METABOLIC PANEL WITH GFR
ALT: 15 U/L (ref 0–35)
AST: 17 U/L (ref 0–37)
Albumin: 4.1 g/dL (ref 3.5–5.2)
Alkaline Phosphatase: 55 U/L (ref 39–117)
BUN: 12 mg/dL (ref 6–23)
CO2: 29 meq/L (ref 19–32)
Calcium: 9.4 mg/dL (ref 8.4–10.5)
Chloride: 108 meq/L (ref 96–112)
Creatinine, Ser: 0.72 mg/dL (ref 0.40–1.20)
GFR: 89.18 mL/min (ref >60.00)
Glucose, Bld: 138 mg/dL — ABNORMAL HIGH (ref 70–99)
Potassium: 4 meq/L (ref 3.5–5.1)
Sodium: 142 meq/L (ref 135–145)
Total Bilirubin: 0.5 mg/dL (ref 0.2–1.2)
Total Protein: 6.9 g/dL (ref 6.0–8.3)

## 2024-06-03 LAB — POCT GLYCOSYLATED HEMOGLOBIN (HGB A1C): Hemoglobin A1C: 6.6 % — AB (ref 4.0–5.6)

## 2024-06-03 LAB — CBC WITH DIFFERENTIAL/PLATELET
Basophils Absolute: 0 10*3/uL (ref 0.0–0.1)
Basophils Relative: 0.6 % (ref 0.0–3.0)
Eosinophils Absolute: 0.2 10*3/uL (ref 0.0–0.7)
Eosinophils Relative: 2.9 % (ref 0.0–5.0)
HCT: 42.2 % (ref 36.0–46.0)
Hemoglobin: 13.9 g/dL (ref 12.0–15.0)
Lymphocytes Relative: 38.5 % (ref 12.0–46.0)
Lymphs Abs: 2.2 10*3/uL (ref 0.7–4.0)
MCHC: 33 g/dL (ref 30.0–36.0)
MCV: 87.4 fl (ref 78.0–100.0)
Monocytes Absolute: 0.4 10*3/uL (ref 0.1–1.0)
Monocytes Relative: 6.4 % (ref 3.0–12.0)
Neutro Abs: 3 10*3/uL (ref 1.4–7.7)
Neutrophils Relative %: 51.6 % (ref 43.0–77.0)
Platelets: 186 10*3/uL (ref 150.0–400.0)
RBC: 4.83 Mil/uL (ref 3.87–5.11)
RDW: 14.1 % (ref 11.5–15.5)
WBC: 5.8 10*3/uL (ref 4.0–10.5)

## 2024-06-03 LAB — LIPID PANEL
Cholesterol: 134 mg/dL (ref 0–200)
HDL: 35.1 mg/dL — ABNORMAL LOW (ref >39.00)
LDL Cholesterol: 83 mg/dL (ref 0–99)
NonHDL: 98.76
Total CHOL/HDL Ratio: 4
Triglycerides: 81 mg/dL (ref 0.0–149.0)
VLDL: 16.2 mg/dL (ref 0.0–40.0)

## 2024-06-03 LAB — VITAMIN D 25 HYDROXY (VIT D DEFICIENCY, FRACTURES): VITD: 13.8 ng/mL — ABNORMAL LOW (ref 30.00–100.00)

## 2024-06-03 LAB — VITAMIN B12: Vitamin B-12: 322 pg/mL (ref 211–911)

## 2024-06-03 LAB — MICROALBUMIN / CREATININE URINE RATIO
Creatinine,U: 85.4 mg/dL
Microalb Creat Ratio: UNDETERMINED mg/g (ref 0.0–30.0)
Microalb, Ur: <0.7 mg/dL

## 2024-06-03 MED ORDER — LISINOPRIL 20 MG PO TABS: 20.0000 mg | ORAL_TABLET | Freq: Every day | ORAL | 3 refills | Status: AC

## 2024-06-03 MED ORDER — EMPAGLIFLOZIN 25 MG PO TABS: 25.0000 mg | ORAL_TABLET | Freq: Every day | ORAL | 3 refills | Status: AC

## 2024-06-03 MED ORDER — ATORVASTATIN CALCIUM 20 MG PO TABS
20.0000 mg | ORAL_TABLET | Freq: Every day | ORAL | 3 refills | Status: AC
Start: 2024-06-03 — End: ?

## 2024-06-03 NOTE — Progress Notes (Signed)
 Anna Mann 63 y.o.   Chief Complaint  Patient presents with   Medication Refill    Patient here for medication refills for Lipitor, Jardiance , and Lisinopril . Mentions her MyChart states she is due for eGFR evaluation     HISTORY OF PRESENT ILLNESS: This is a 63 y.o. female A1A here for follow-up of chronic medical conditions including diabetes and hypertension Also needs medication refills Overall doing well.  Has no complaints or medical concerns today. Lab Results  Component Value Date   HGBA1C 7.4 (A) 10/09/2022   Wt Readings from Last 3 Encounters:  04/23/23 240 lb (108.9 kg)  04/13/23 240 lb (108.9 kg)  02/21/23 239 lb 4 oz (108.5 kg)      Medication Refill Pertinent negatives include no abdominal pain, chest pain, chills, congestion, coughing, fever, headaches, nausea, rash, sore throat or vomiting.     Prior to Admission medications   Medication Sig Start Date End Date Taking? Authorizing Provider  atorvastatin  (LIPITOR) 20 MG tablet Take 1 tablet (20 mg total) by mouth daily. 02/21/23  Yes Loxley Cibrian, Emil Schanz, MD  cyclobenzaprine  (FLEXERIL ) 10 MG tablet Take 1 tablet (10 mg total) by mouth 3 (three) times daily as needed for muscle spasms. 04/19/23  Yes Cendy Oconnor, Emil Schanz, MD  empagliflozin  (JARDIANCE ) 25 MG TABS tablet Take 1 tablet (25 mg total) by mouth daily. 02/21/23  Yes Vraj Denardo, Emil Schanz, MD  glipiZIDE  (GLUCOTROL ) 5 MG tablet Take 1 tablet (5 mg total) by mouth 2 (two) times daily before a meal. 06/20/23  Yes Tramar Brueckner, Emil Schanz, MD  glucose blood (TRUE METRIX BLOOD GLUCOSE TEST) test strip Use as instructed. Check blood glucose level by fingerstick twice per day.  E11.65 11/22/19  Yes Fleming, Zelda W, NP  ibuprofen  (ADVIL ) 600 MG tablet Take 1 tablet (600 mg total) by mouth every 6 (six) hours as needed. 04/14/23  Yes Ali, Amjad, PA-C  lidocaine  (LIDODERM ) 5 % Place 1 patch onto the skin daily. Remove & Discard patch within 12 hours or as directed by  MD 04/14/23  Yes Hildegard, Amjad, PA-C  lisinopril  (ZESTRIL ) 20 MG tablet Take 1 tablet (20 mg total) by mouth daily. 02/21/23  Yes Afomia Blackley, Emil Schanz, MD  omeprazole  (PRILOSEC) 20 MG capsule Take 1 capsule (20 mg total) by mouth 2 (two) times daily before a meal. 02/12/20 06/03/24 Yes Fleming, Zelda W, NP  ondansetron  (ZOFRAN -ODT) 4 MG disintegrating tablet Dissolve 1 tablet (4 mg total) by mouth every 8 (eight) hours as needed for nausea or vomiting. 03/08/22  Yes Armbruster, Elspeth SQUIBB, MD  TRUEplus Lancets 28G MISC Use as instructed. Check blood glucose level by fingerstick twice per day. 11/22/19  Yes Fleming, Zelda W, NP  methylPREDNISolone  (MEDROL  DOSEPAK) 4 MG TBPK tablet Take as directed. Patient not taking: Reported on 06/03/2024 04/19/23   Purcell Emil Schanz, MD  oxyCODONE  (ROXICODONE ) 5 MG immediate release tablet Take 1 tablet (5 mg total) by mouth every 6 (six) hours as needed for severe pain or breakthrough pain. Patient not taking: Reported on 06/03/2024 04/14/23   Hildegard Loge, PA-C  fluticasone  (FLONASE ) 50 MCG/ACT nasal spray Place 1 spray into both nostrils daily. Patient not taking: Reported on 03/18/2020 01/25/20 12/15/20  Fleming, Zelda W, NP    Allergies  Allergen Reactions   Iodine    Ivp Dye [Iodinated Contrast Media]    Shellfish Allergy     Patient Active Problem List   Diagnosis Date Noted   Hypertension associated with diabetes (HCC) 01/03/2022   Dyslipidemia  associated with type 2 diabetes mellitus (HCC) 01/03/2022   Uncontrolled type 2 diabetes mellitus with hyperglycemia (HCC) 03/11/2021   Fibroids 12/18/2019    Past Medical History:  Diagnosis Date   Abnormal Pap smear    colposcopy   Abnormal uterine bleeding (AUB)    Allergy    Diabetes mellitus without complication (HCC)    GERD (gastroesophageal reflux disease)    Heart murmur    Hyperlipidemia    Hypertension    Vertigo     Past Surgical History:  Procedure Laterality Date   COLPOSCOPY     DILATATION &  CURETTAGE/HYSTEROSCOPY WITH MYOSURE N/A 05/25/2021   Procedure: DILATATION & CURETTAGE/HYSTEROSCOPY WITH MYOSURE;  Surgeon: Storm Setter, DO;  Location: [facility];  Service: Gynecology;  Laterality: N/A;   DILATION AND CURETTAGE OF UTERUS     KNEE ARTHROSCOPY W/ ACL RECONSTRUCTION Left    LAPAROSCOPY N/A 05/25/2021   Procedure: LAPAROSCOPY DIAGNOSTIC,;  Surgeon: Storm Setter, DO;  Location: [facility];  Service: Gynecology;  Laterality: N/A;   TUBAL LIGATION      Social History   Socioeconomic History   Marital status: Single    Spouse name: Not on file   Number of children: 3   Years of education: Not on file   Highest education level: Associate degree: occupational, Scientist, product/process development, or vocational program  Occupational History   Occupation: Psychologist, sport and exercise rep  Tobacco Use   Smoking status: Never   Smokeless tobacco: Never  Vaping Use   Vaping status: Never Used  Substance and Sexual Activity   Alcohol use: No   Drug use: No   Sexual activity: Not Currently    Birth control/protection: Post-menopausal  Other Topics Concern   Not on file  Social History Narrative   Not on file   Social Drivers of Health   Financial Resource Strain: Low Risk  (06/02/2024)   Overall Financial Resource Strain (CARDIA)    Difficulty of Paying Living Expenses: Not hard at all  Food Insecurity: No Food Insecurity (06/02/2024)   Hunger Vital Sign    Worried About Running Out of Food in the Last Year: Never true    Ran Out of Food in the Last Year: Never true  Transportation Needs: No Transportation Needs (06/02/2024)   PRAPARE - Administrator, Civil Service (Medical): No    Lack of Transportation (Non-Medical): No  Physical Activity: Unknown (06/02/2024)   Exercise Vital Sign    Days of Exercise per Week: Patient declined    Minutes of Exercise per Session: Not on file  Recent Concern: Physical Activity - Insufficiently Active (03/12/2024)   Exercise Vital  Sign    Days of Exercise per Week: 1 day    Minutes of Exercise per Session: 10 min  Stress: Stress Concern Present (06/02/2024)   [employer] of Occupational Health - Occupational Stress Questionnaire    Feeling of Stress: To some extent  Social Connections: Moderately Integrated (06/02/2024)   Social Connection and Isolation Panel    Frequency of Communication with Friends and Family: More than three times a week    Frequency of Social Gatherings with Friends and Family: More than three times a week    Attends Religious Services: More than 4 times per year    Active Member of [employer] or Organizations: Yes    Attends Engineer, structural: More than 4 times per year    Marital Status: Divorced  Catering manager Violence: Not on file  Family History  Problem Relation Age of Onset   Aneurysm Mother    Heart disease Mother    Diabetes Mother    Hypertension Mother    Colon cancer Father        colon   Diabetes Sister    Kidney failure Sister    Heart failure Sister    Hodgkin's lymphoma Sister    Diabetes Brother    Heart disease Brother    Congestive Heart Failure Brother    Heart disease Brother    Diabetes Brother    Pancreatitis Brother    Heart failure Brother    Diabetes Maternal Grandmother    Hypertension Maternal Grandmother    Congestive Heart Failure Maternal Grandmother    Heart disease Paternal Grandmother    Hypertension Paternal Grandmother    Stroke Paternal Grandfather    Ovarian cancer Maternal Aunt        2021   Colon cancer Maternal Aunt    Esophageal cancer Paternal Aunt    Hodgkin's lymphoma Niece    Diabetes Daughter    Other Daughter        pre diabetic   Kidney failure Nephew    Diabetes Nephew    Breast cancer Cousin    Colon polyps Neg Hx    Stomach cancer Neg Hx      Review of Systems  Constitutional: Negative.  Negative for chills and fever.  HENT: Negative.  Negative for congestion and sore throat.   Respiratory:  Negative.  Negative for cough and shortness of breath.   Cardiovascular: Negative.  Negative for chest pain and palpitations.  Gastrointestinal:  Negative for abdominal pain, diarrhea, nausea and vomiting.  Genitourinary: Negative.  Negative for dysuria and hematuria.  Skin: Negative.  Negative for rash.  Neurological: Negative.  Negative for dizziness and headaches.  All other systems reviewed and are negative.   Today's Vitals   06/03/24 0953  BP: 128/88  Pulse: 67  Temp: 98.3 F (36.8 C)  TempSrc: Oral  SpO2: 96%  Weight: 245 lb (111.1 kg)  Height: 5' 3 (1.6 m)   Body mass index is 43.4 kg/m.   Physical Exam Vitals reviewed.  Constitutional:      Appearance: Normal appearance.  HENT:     Head: Normocephalic.     Mouth/Throat:     Mouth: Mucous membranes are moist.     Pharynx: Oropharynx is clear.   Eyes:     Extraocular Movements: Extraocular movements intact.     Pupils: Pupils are equal, round, and reactive to light.    Cardiovascular:     Rate and Rhythm: Normal rate and regular rhythm.     Pulses: Normal pulses.     Heart sounds: Normal heart sounds.  Pulmonary:     Effort: Pulmonary effort is normal.     Breath sounds: Normal breath sounds.   Skin:    General: Skin is warm and dry.   Neurological:     Mental Status: She is alert and oriented to person, place, and time.   Psychiatric:        Mood and Affect: Mood normal.        Behavior: Behavior normal.    Results for orders placed or performed in visit on 06/03/24 (from the past 24 hours)  POCT glycosylated hemoglobin (Hb A1C)     Status: Abnormal   Collection Time: 06/03/24 10:07 AM  Result Value Ref Range   Hemoglobin A1C 6.6 (A) 4.0 - 5.6 %  HbA1c POC (<> result, manual entry)     HbA1c, POC (prediabetic range)     HbA1c, POC (controlled diabetic range)       ASSESSMENT & PLAN: A total of 44 minutes was spent with the patient and counseling/coordination of care regarding preparing  for this visit, review of most recent office visit notes, review of multiple chronic medical conditions and their management, cardiovascular risks associated with diabetes and hypertension, review of all medications, review of most recent bloodwork results including interpretation of today's hemoglobin A1c, review of health maintenance items, education on nutrition, prognosis, documentation, and need for follow up.   Problem List Items Addressed This Visit       Cardiovascular and Mediastinum   Hypertension associated with diabetes (HCC) - Primary   BP Readings from Last 3 Encounters:  06/03/24 128/88  04/23/23 138/88  04/14/23 (!) 137/91  Well-controlled hypertension Continue lisinopril  20 mg daily Well-controlled diabetes with hemoglobin A1c of 6.6 Continue Jardiance  25 mg daily and glipizide  5 mg twice a day Cardiovascular risks associated with hypertension and diabetes discussed Diet and nutrition discussed Blood work and urine done today Recommend follow-up in 6 months       Relevant Medications   atorvastatin  (LIPITOR) 20 MG tablet   empagliflozin  (JARDIANCE ) 25 MG TABS tablet   lisinopril  (ZESTRIL ) 20 MG tablet   Other Relevant Orders   Microalbumin / creatinine urine ratio   CBC with Differential/Platelet   Comprehensive metabolic panel with GFR   Lipid panel   POCT glycosylated hemoglobin (Hb A1C) (Completed)   Vitamin B12   VITAMIN D 25 Hydroxy (Vit-D Deficiency, Fractures)     Endocrine   Dyslipidemia associated with type 2 diabetes mellitus (HCC)   Chronic stable condition.  Well-controlled.  Hemoglobin A1c 6.6 Continue glipizide  and Jardiance  Continue atorvastatin  20 mg daily. Blood work done today Follow-up in 6 months      Relevant Medications   atorvastatin  (LIPITOR) 20 MG tablet   empagliflozin  (JARDIANCE ) 25 MG TABS tablet   lisinopril  (ZESTRIL ) 20 MG tablet   Other Relevant Orders   Microalbumin / creatinine urine ratio   CBC with  Differential/Platelet   Comprehensive metabolic panel with GFR   Lipid panel   POCT glycosylated hemoglobin (Hb A1C) (Completed)   Vitamin B12   VITAMIN D 25 Hydroxy (Vit-D Deficiency, Fractures)   Other Visit Diagnoses       Type 2 diabetes mellitus with hyperglycemia, without long-term current use of insulin (HCC)       Relevant Medications   atorvastatin  (LIPITOR) 20 MG tablet   empagliflozin  (JARDIANCE ) 25 MG TABS tablet   lisinopril  (ZESTRIL ) 20 MG tablet      Patient Instructions  Diabetes Mellitus and Nutrition, Adult When you have diabetes, or diabetes mellitus, it is very important to have healthy eating habits because your blood sugar (glucose) levels are greatly affected by what you eat and drink. Eating healthy foods in the right amounts, at about the same times every day, can help you: Manage your blood glucose. Lower your risk of heart disease. Improve your blood pressure. Reach or maintain a healthy weight. What can affect my meal plan? Every person with diabetes is different, and each person has different needs for a meal plan. Your health care provider may recommend that you work with a dietitian to make a meal plan that is best for you. Your meal plan may vary depending on factors such as: The calories you need. The medicines you take. Your weight.  Your blood glucose, blood pressure, and cholesterol levels. Your activity level. Other health conditions you have, such as heart or kidney disease. How do carbohydrates affect me? Carbohydrates, also called carbs, affect your blood glucose level more than any other type of food. Eating carbs raises the amount of glucose in your blood. It is important to know how many carbs you can safely have in each meal. This is different for every person. Your dietitian can help you calculate how many carbs you should have at each meal and for each snack. How does alcohol affect me? Alcohol can cause a decrease in blood glucose  (hypoglycemia), especially if you use insulin or take certain diabetes medicines by mouth. Hypoglycemia can be a life-threatening condition. Symptoms of hypoglycemia, such as sleepiness, dizziness, and confusion, are similar to symptoms of having too much alcohol. Do not drink alcohol if: Your health care provider tells you not to drink. You are pregnant, may be pregnant, or are planning to become pregnant. If you drink alcohol: Limit how much you have to: 0-1 drink a day for women. 0-2 drinks a day for men. Know how much alcohol is in your drink. In the [place] one drink equals one 12 oz bottle of beer (355 mL), one 5 oz glass of wine (148 mL), or one 1 oz glass of hard liquor (44 mL). Keep yourself hydrated with water, diet soda, or unsweetened iced tea. Keep in mind that regular soda, juice, and other mixers may contain a lot of sugar and must be counted as carbs. What are tips for following this plan?  Reading food labels Start by checking the serving size on the Nutrition Facts label of packaged foods and drinks. The number of calories and the amount of carbs, fats, and other nutrients listed on the label are based on one serving of the item. Many items contain more than one serving per package. Check the total grams (g) of carbs in one serving. Check the number of grams of saturated fats and trans fats in one serving. Choose foods that have a low amount or none of these fats. Check the number of milligrams (mg) of salt (sodium) in one serving. Most people should limit total sodium intake to less than 2,300 mg per day. Always check the nutrition information of foods labeled as low-fat or nonfat. These foods may be higher in added sugar or refined carbs and should be avoided. Talk to your dietitian to identify your daily goals for nutrients listed on the label. Shopping Avoid buying canned, pre-made, or processed foods. These foods tend to be high in fat, sodium, and added sugar. Shop  around the outside edge of the grocery store. This is where you will most often find fresh fruits and vegetables, bulk grains, fresh meats, and fresh dairy products. Cooking Use low-heat cooking methods, such as baking, instead of high-heat cooking methods, such as deep frying. Cook using healthy oils, such as olive, canola, or sunflower oil. Avoid cooking with butter, cream, or high-fat meats. Meal planning Eat meals and snacks regularly, preferably at the same times every day. Avoid going long periods of time without eating. Eat foods that are high in fiber, such as fresh fruits, vegetables, beans, and whole grains. Eat 4-6 oz (112-168 g) of lean protein each day, such as lean meat, chicken, fish, eggs, or tofu. One ounce (oz) (28 g) of lean protein is equal to: 1 oz (28 g) of meat, chicken, or fish. 1 egg.  cup (62 g) of  tofu. Eat some foods each day that contain healthy fats, such as avocado, nuts, seeds, and fish. What foods should I eat? Fruits Berries. Apples. Oranges. Peaches. Apricots. Plums. Grapes. Mangoes. Papayas. Pomegranates. Kiwi. Cherries. Vegetables Leafy greens, including lettuce, spinach, kale, chard, collard greens, mustard greens, and cabbage. Beets. Cauliflower. Broccoli. Carrots. Green beans. Tomatoes. Peppers. Onions. Cucumbers. Brussels sprouts. Grains Whole grains, such as whole-wheat or whole-grain bread, crackers, tortillas, cereal, and pasta. Unsweetened oatmeal. Quinoa. Brown or wild rice. Meats and other proteins Seafood. Poultry without skin. Lean cuts of poultry and beef. Tofu. Nuts. Seeds. Dairy Low-fat or fat-free dairy products such as milk, yogurt, and cheese. The items listed above may not be a complete list of foods and beverages you can eat and drink. Contact a dietitian for more information. What foods should I avoid? Fruits Fruits canned with syrup. Vegetables Canned vegetables. Frozen vegetables with butter or cream sauce. Grains Refined  white flour and flour products such as bread, pasta, snack foods, and cereals. Avoid all processed foods. Meats and other proteins Fatty cuts of meat. Poultry with skin. Breaded or fried meats. Processed meat. Avoid saturated fats. Dairy Full-fat yogurt, cheese, or milk. Beverages Sweetened drinks, such as soda or iced tea. The items listed above may not be a complete list of foods and beverages you should avoid. Contact a dietitian for more information. Questions to ask a health care provider Do I need to meet with a certified diabetes care and education specialist? Do I need to meet with a dietitian? What number can I call if I have questions? When are the best times to check my blood glucose? Where to find more information: American Diabetes Association: diabetes.org Academy of Nutrition and Dietetics: eatright.[employer] of Diabetes and Digestive and Kidney Diseases: StageSync.si Association of Diabetes Care & Education Specialists: diabeteseducator.org Summary It is important to have healthy eating habits because your blood sugar (glucose) levels are greatly affected by what you eat and drink. It is important to use alcohol carefully. A healthy meal plan will help you manage your blood glucose and lower your risk of heart disease. Your health care provider may recommend that you work with a dietitian to make a meal plan that is best for you. This information is not intended to replace advice given to you by your health care provider. Make sure you discuss any questions you have with your health care provider. Document Revised: 06/29/2020 Document Reviewed: 06/30/2020 Elsevier Patient Education  2024 Elsevier Inc.     Emil Schaumann, MD [facility]

## 2024-06-03 NOTE — Assessment & Plan Note (Signed)
 Chronic stable condition.  Well-controlled.  Hemoglobin A1c 6.6 Continue glipizide  and Jardiance  Continue atorvastatin  20 mg daily. Blood work done today Follow-up in 6 months

## 2024-06-03 NOTE — Telephone Encounter (Signed)
 Pharmacy Patient Advocate Encounter   Received notification from Patient Advice Request messages that prior authorization for Jardiance  25mg  tabs is required/requested.   Insurance verification completed.   The patient is insured through [employer] .   Per test claim: PA required; PA submitted to above mentioned insurance via CoverMyMeds Key/confirmation #/EOC [id number] Status is pending

## 2024-06-03 NOTE — Patient Instructions (Signed)

## 2024-06-03 NOTE — Assessment & Plan Note (Signed)
 BP Readings from Last 3 Encounters:  06/03/24 128/88  04/23/23 138/88  04/14/23 (!) 137/91  Well-controlled hypertension Continue lisinopril  20 mg daily Well-controlled diabetes with hemoglobin A1c of 6.6 Continue Jardiance  25 mg daily and glipizide  5 mg twice a day Cardiovascular risks associated with hypertension and diabetes discussed Diet and nutrition discussed Blood work and urine done today Recommend follow-up in 6 months

## 2024-06-04 ENCOUNTER — Other Ambulatory Visit (HOSPITAL_COMMUNITY): Payer: Self-pay

## 2024-06-06 NOTE — Telephone Encounter (Signed)
 Pharmacy Patient Advocate Encounter  Received notification from [employer] that Prior Authorization for Jardiance  25mg  tabs has been DENIED.  Full denial letter will be uploaded to the media tab. See denial reason below.   PA #/Case ID/Reference #: [id number]

## 2024-06-06 NOTE — Telephone Encounter (Signed)
 Called insurance to follow up on the denial and spoke with rep. Hope and let her know the patient has tried/failed metformin  due to  an intolerance.     Case ID: [id number]  Phone# [phone]

## 2024-06-09 NOTE — Telephone Encounter (Signed)
 Pharmacy Patient Advocate Encounter  Received notification from [facility] that Prior Authorization for JARDIANCE  has been APPROVED from 06/06/24 to 06/06/25   PA #/Case ID/Reference #: [id number]

## 2024-06-11 ENCOUNTER — Ambulatory Visit: Payer: Self-pay

## 2024-06-11 ENCOUNTER — Other Ambulatory Visit: Payer: Self-pay | Admitting: Emergency Medicine

## 2024-06-11 DIAGNOSIS — E1165 Type 2 diabetes mellitus with hyperglycemia: Secondary | ICD-10-CM

## 2024-06-11 DIAGNOSIS — E1159 Type 2 diabetes mellitus with other circulatory complications: Secondary | ICD-10-CM

## 2024-06-11 NOTE — Telephone Encounter (Signed)
 Copied from CRM [id number]. Topic: Clinical - Medication Refill >> Jun 11, 2024  4:50 PM Zebedee SAUNDERS wrote: Medication: empagliflozin  (JARDIANCE ) 25 MG TABS tablet  Has the patient contacted their pharmacy? Yes (Agent: If no, request that the patient contact the pharmacy for the refill. If patient does not wish to contact the pharmacy document the reason why and proceed with request.) (Agent: If yes, when and what did the pharmacy advise?)  This is the patient's preferred pharmacy:  [facility] [address] ([address] [place] - [address] [place] ([place]) [place] [address] Phone: [phone] Fax: [phone]  Is this the correct pharmacy for this prescription? Yes If no, delete pharmacy and type the correct one.   Has the prescription been filled recently? Yes  Is the patient out of the medication? Yes  Has the patient been seen for an appointment in the last year OR does the patient have an upcoming appointment? Yes  Can we respond through MyChart? Yes  Agent: Please be advised that Rx refills may take up to 3 business days. We ask that you follow-up with your pharmacy. >> Jun 11, 2024  4:52 PM Zebedee SAUNDERS wrote: Received notification from [facility] that Prior Authorization for JARDIANCE  has been APPROVED from 06/06/24 to 06/06/25

## 2024-06-11 NOTE — Telephone Encounter (Signed)
 Copied from CRM [id number]. Topic: Clinical - Medication Refill >> Jun 11, 2024  4:50 PM Zebedee SAUNDERS wrote: Medication: empagliflozin  (JARDIANCE ) 25 MG TABS tablet  Has the patient contacted their pharmacy? Yes (Agent: If no, request that the patient contact the pharmacy for the refill. If patient does not wish to contact the pharmacy document the reason why and proceed with request.) (Agent: If yes, when and what did the pharmacy advise?)  This is the patient's preferred pharmacy:  [facility] [address] ([address]), [place] - [address] [place] ([place]) [place] [address] Phone: [phone] Fax: [phone]  Is this the correct pharmacy for this prescription? Yes If no, delete pharmacy and type the correct one.   Has the prescription been filled recently? Yes  Is the patient out of the medication? Yes  Has the patient been seen for an appointment in the last year OR does the patient have an upcoming appointment? Yes  Can we respond through MyChart? Yes  Agent: Please be advised that Rx refills may take up to 3 business days. We ask that you follow-up with your pharmacy.

## 2024-06-12 ENCOUNTER — Other Ambulatory Visit: Payer: Self-pay | Admitting: Radiology

## 2024-06-12 DIAGNOSIS — I152 Hypertension secondary to endocrine disorders: Secondary | ICD-10-CM

## 2024-06-12 DIAGNOSIS — E1165 Type 2 diabetes mellitus with hyperglycemia: Secondary | ICD-10-CM

## 2024-07-08 ENCOUNTER — Encounter

## 2024-07-08 ENCOUNTER — Ambulatory Visit

## 2024-07-08 ENCOUNTER — Ambulatory Visit
Admission: RE | Admit: 2024-07-08 | Discharge: 2024-07-08 | Disposition: A | Discharge status: Home / Self Care | Source: Ambulatory Visit | Attending: Emergency Medicine | Admitting: Emergency Medicine

## 2024-07-08 ENCOUNTER — Other Ambulatory Visit

## 2024-07-08 DIAGNOSIS — R928 Other abnormal and inconclusive findings on diagnostic imaging of breast: Secondary | ICD-10-CM

## 2024-07-09 ENCOUNTER — Other Ambulatory Visit: Payer: Self-pay | Admitting: Emergency Medicine

## 2024-07-09 DIAGNOSIS — R921 Mammographic calcification found on diagnostic imaging of breast: Secondary | ICD-10-CM

## 2024-07-10 ENCOUNTER — Ambulatory Visit
Admission: RE | Admit: 2024-07-10 | Discharge: 2024-07-10 | Disposition: A | Discharge status: Home / Self Care | Source: Ambulatory Visit | Attending: Emergency Medicine

## 2024-07-10 ENCOUNTER — Ambulatory Visit: Payer: Self-pay | Admitting: Emergency Medicine

## 2024-07-10 ENCOUNTER — Ambulatory Visit
Admission: RE | Admit: 2024-07-10 | Discharge: 2024-07-10 | Disposition: A | Discharge status: Home / Self Care | Source: Ambulatory Visit | Attending: Emergency Medicine | Admitting: Emergency Medicine

## 2024-07-10 DIAGNOSIS — R921 Mammographic calcification found on diagnostic imaging of breast: Secondary | ICD-10-CM

## 2024-07-10 HISTORY — PX: BREAST BIOPSY: SHX20

## 2024-07-11 LAB — SURGICAL PATHOLOGY

## 2024-07-21 ENCOUNTER — Other Ambulatory Visit: Payer: Self-pay | Admitting: Surgery

## 2024-07-21 DIAGNOSIS — N6091 Unspecified benign mammary dysplasia of right breast: Secondary | ICD-10-CM

## 2024-07-22 ENCOUNTER — Ambulatory Visit (INDEPENDENT_AMBULATORY_CARE_PROVIDER_SITE_OTHER): Admitting: Emergency Medicine

## 2024-07-22 ENCOUNTER — Encounter: Payer: Self-pay | Admitting: Emergency Medicine

## 2024-07-22 VITALS — BP 124/72 | HR 64 | Temp 98.9°F | Ht 63.0 in | Wt 248.0 lb

## 2024-07-22 DIAGNOSIS — E1169 Type 2 diabetes mellitus with other specified complication: Secondary | ICD-10-CM | POA: Diagnosis not present

## 2024-07-22 DIAGNOSIS — N898 Other specified noninflammatory disorders of vagina: Secondary | ICD-10-CM | POA: Diagnosis not present

## 2024-07-22 DIAGNOSIS — R29818 Other symptoms and signs involving the nervous system: Secondary | ICD-10-CM | POA: Diagnosis not present

## 2024-07-22 DIAGNOSIS — Z0001 Encounter for general adult medical examination with abnormal findings: Secondary | ICD-10-CM

## 2024-07-22 DIAGNOSIS — E1159 Type 2 diabetes mellitus with other circulatory complications: Secondary | ICD-10-CM | POA: Diagnosis not present

## 2024-07-22 DIAGNOSIS — I152 Hypertension secondary to endocrine disorders: Secondary | ICD-10-CM | POA: Diagnosis not present

## 2024-07-22 DIAGNOSIS — Z0000 Encounter for general adult medical examination without abnormal findings: Secondary | ICD-10-CM

## 2024-07-22 DIAGNOSIS — Z7984 Long term (current) use of oral hypoglycemic drugs: Secondary | ICD-10-CM

## 2024-07-22 DIAGNOSIS — E785 Hyperlipidemia, unspecified: Secondary | ICD-10-CM

## 2024-07-22 NOTE — Assessment & Plan Note (Signed)
 Chronic discharge Needs gynecology evaluation Referral placed today

## 2024-07-22 NOTE — Progress Notes (Signed)
 Anna Mann 63 y.o.   Chief Complaint  Patient presents with   Annual Exam    Patient here for physical. Patient mentions have vaginal discharge that started in May, she mentions it itches at night and does have a odor to it.      HISTORY OF PRESENT ILLNESS: This is a 63 y.o. female here for annual exam and follow-up on chronic medical conditions Complaining of vaginal discharge that started last May.  Needs referral to gynecology Also concerned about possible sleep apnea.  Snores.  Wakes up tired and has daytime somnolence. No other complaints or medical concerns today.  HPI   Prior to Admission medications   Medication Sig Start Date End Date Taking? Authorizing Provider  atorvastatin  (LIPITOR) 20 MG tablet Take 1 tablet (20 mg total) by mouth daily. 06/03/24  Yes Aditi Rovira, Emil Schanz, MD  cyclobenzaprine  (FLEXERIL ) 10 MG tablet Take 1 tablet (10 mg total) by mouth 3 (three) times daily as needed for muscle spasms. 04/19/23  Yes Latoy Labriola, Emil Schanz, MD  empagliflozin  (JARDIANCE ) 25 MG TABS tablet Take 1 tablet (25 mg total) by mouth daily. 06/03/24  Yes Nimo Verastegui, Emil Schanz, MD  glipiZIDE  (GLUCOTROL ) 5 MG tablet Take 1 tablet (5 mg total) by mouth 2 (two) times daily before a meal. 06/20/23  Yes Etherine Mackowiak, Emil Schanz, MD  glucose blood (TRUE METRIX BLOOD GLUCOSE TEST) test strip Use as instructed. Check blood glucose level by fingerstick twice per day.  E11.65 11/22/19  Yes Fleming, Zelda W, NP  ibuprofen  (ADVIL ) 600 MG tablet Take 1 tablet (600 mg total) by mouth every 6 (six) hours as needed. 04/14/23  Yes Ali, Amjad, PA-C  lidocaine  (LIDODERM ) 5 % Place 1 patch onto the skin daily. Remove & Discard patch within 12 hours or as directed by MD 04/14/23  Yes Hildegard, Amjad, PA-C  lisinopril  (ZESTRIL ) 20 MG tablet Take 1 tablet (20 mg total) by mouth daily. 06/03/24  Yes Kramer Hanrahan, Emil Schanz, MD  omeprazole  (PRILOSEC) 20 MG capsule Take 1 capsule (20 mg total) by mouth 2 (two) times daily  before a meal. 02/12/20 07/22/24 Yes Fleming, Zelda W, NP  ondansetron  (ZOFRAN -ODT) 4 MG disintegrating tablet Dissolve 1 tablet (4 mg total) by mouth every 8 (eight) hours as needed for nausea or vomiting. 03/08/22  Yes Armbruster, Elspeth SQUIBB, MD  TRUEplus Lancets 28G MISC Use as instructed. Check blood glucose level by fingerstick twice per day. 11/22/19  Yes Theotis Haze ORN, NP    Allergies  Allergen Reactions   Iodine    Ivp Dye [Iodinated Contrast Media]    Shellfish Allergy     Patient Active Problem List   Diagnosis Date Noted   Hypertension associated with diabetes (HCC) 01/03/2022   Dyslipidemia associated with type 2 diabetes mellitus (HCC) 01/03/2022   Uncontrolled type 2 diabetes mellitus with hyperglycemia (HCC) 03/11/2021   Fibroids 12/18/2019    Past Medical History:  Diagnosis Date   Abnormal Pap smear    colposcopy   Abnormal uterine bleeding (AUB)    Allergy    Diabetes mellitus without complication (HCC)    GERD (gastroesophageal reflux disease)    Heart murmur    Hyperlipidemia    Hypertension    Vertigo     Past Surgical History:  Procedure Laterality Date   BREAST BIOPSY Right 07/10/2024   MM RT BREAST BX W LOC DEV 1ST LESION IMAGE BX SPEC STEREO GUIDE 07/10/2024 [facility]   COLPOSCOPY     DILATATION & CURETTAGE/HYSTEROSCOPY WITH MYOSURE  N/A 05/25/2021   Procedure: DILATATION & CURETTAGE/HYSTEROSCOPY WITH MYOSURE;  Surgeon: Storm Setter, DO;  Location: [facility];  Service: Gynecology;  Laterality: N/A;   DILATION AND CURETTAGE OF UTERUS     KNEE ARTHROSCOPY W/ ACL RECONSTRUCTION Left    LAPAROSCOPY N/A 05/25/2021   Procedure: LAPAROSCOPY DIAGNOSTIC,;  Surgeon: Storm Setter, DO;  Location: [facility];  Service: Gynecology;  Laterality: N/A;   TUBAL LIGATION      Social History   Socioeconomic History   Marital status: Single    Spouse name: Not on file   Number of children: 3   Years of education: Not on  file   Highest education level: Associate degree: occupational, Scientist, product/process development, or vocational program  Occupational History   Occupation: Psychologist, sport and exercise rep  Tobacco Use   Smoking status: Never   Smokeless tobacco: Never  Vaping Use   Vaping status: Never Used  Substance and Sexual Activity   Alcohol use: No   Drug use: No   Sexual activity: Not Currently    Birth control/protection: Post-menopausal  Other Topics Concern   Not on file  Social History Narrative   Not on file   Social Drivers of Health   Financial Resource Strain: Low Risk  (06/02/2024)   Overall Financial Resource Strain (CARDIA)    Difficulty of Paying Living Expenses: Not hard at all  Food Insecurity: No Food Insecurity (06/02/2024)   Hunger Vital Sign    Worried About Running Out of Food in the Last Year: Never true    Ran Out of Food in the Last Year: Never true  Transportation Needs: No Transportation Needs (06/02/2024)   PRAPARE - Administrator, Civil Service (Medical): No    Lack of Transportation (Non-Medical): No  Physical Activity: Unknown (06/02/2024)   Exercise Vital Sign    Days of Exercise per Week: Patient declined    Minutes of Exercise per Session: Not on file  Recent Concern: Physical Activity - Insufficiently Active (03/12/2024)   Exercise Vital Sign    Days of Exercise per Week: 1 day    Minutes of Exercise per Session: 10 min  Stress: Stress Concern Present (06/02/2024)   [employer] of Occupational Health - Occupational Stress Questionnaire    Feeling of Stress: To some extent  Social Connections: Moderately Integrated (06/02/2024)   Social Connection and Isolation Panel    Frequency of Communication with Friends and Family: More than three times a week    Frequency of Social Gatherings with Friends and Family: More than three times a week    Attends Religious Services: More than 4 times per year    Active Member of [employer] or Organizations: Yes    Attends Hospital doctor: More than 4 times per year    Marital Status: Divorced  Catering manager Violence: Not on file    Family History  Problem Relation Age of Onset   Aneurysm Mother    Heart disease Mother    Diabetes Mother    Hypertension Mother    Colon cancer Father        colon   Diabetes Sister    Kidney failure Sister    Heart failure Sister    Hodgkin's lymphoma Sister    Diabetes Brother    Heart disease Brother    Congestive Heart Failure Brother    Heart disease Brother    Diabetes Brother    Pancreatitis Brother    Heart failure Brother  Diabetes Maternal Grandmother    Hypertension Maternal Grandmother    Congestive Heart Failure Maternal Grandmother    Heart disease Paternal Grandmother    Hypertension Paternal Grandmother    Stroke Paternal Grandfather    Ovarian cancer Maternal Aunt        2021   Colon cancer Maternal Aunt    Esophageal cancer Paternal Aunt    Hodgkin's lymphoma Niece    Diabetes Daughter    Other Daughter        pre diabetic   Kidney failure Nephew    Diabetes Nephew    Breast cancer Cousin    Colon polyps Neg Hx    Stomach cancer Neg Hx      Review of Systems  Constitutional: Negative.  Negative for chills and fever.  HENT: Negative.  Negative for congestion and sore throat.   Respiratory: Negative.  Negative for cough and shortness of breath.   Cardiovascular: Negative.  Negative for chest pain and palpitations.  Gastrointestinal:  Negative for abdominal pain, diarrhea, nausea and vomiting.  Genitourinary: Negative.  Negative for dysuria and hematuria.  Skin: Negative.  Negative for rash.  Neurological: Negative.  Negative for dizziness and headaches.  All other systems reviewed and are negative.   Vitals:   07/22/24 0918  BP: 124/72  Pulse: 64  Temp: 98.9 F (37.2 C)  SpO2: 95%    Physical Exam Vitals reviewed.  Constitutional:      Appearance: Normal appearance. She is obese.  HENT:     Head: Normocephalic.      Right Ear: Tympanic membrane, ear canal and external ear normal.     Left Ear: Tympanic membrane, ear canal and external ear normal.     Mouth/Throat:     Mouth: Mucous membranes are moist.     Pharynx: Oropharynx is clear.  Eyes:     Extraocular Movements: Extraocular movements intact.     Conjunctiva/sclera: Conjunctivae normal.     Pupils: Pupils are equal, round, and reactive to light.  Cardiovascular:     Rate and Rhythm: Normal rate and regular rhythm.     Pulses: Normal pulses.     Heart sounds: Normal heart sounds.  Pulmonary:     Effort: Pulmonary effort is normal.     Breath sounds: Normal breath sounds.  Abdominal:     Palpations: Abdomen is soft.     Tenderness: There is no abdominal tenderness.  Musculoskeletal:     Cervical back: No tenderness.  Lymphadenopathy:     Cervical: No cervical adenopathy.  Skin:    General: Skin is warm and dry.     Capillary Refill: Capillary refill takes less than 2 seconds.  Neurological:     General: No focal deficit present.     Mental Status: She is alert and oriented to person, place, and time.  Psychiatric:        Mood and Affect: Mood normal.        Behavior: Behavior normal.      ASSESSMENT & PLAN: Problem List Items Addressed This Visit       Cardiovascular and Mediastinum   Hypertension associated with diabetes (HCC)   Well-controlled hypertension Continue lisinopril  20 mg daily Well-controlled diabetes with hemoglobin A1c of 6.6 Continue Jardiance  25 mg daily and glipizide  5 mg twice a day Cardiovascular risks associated with hypertension and diabetes discussed Diet and nutrition discussed Blood work and urine done today Recommend follow-up in 6 months        Endocrine  Dyslipidemia associated with type 2 diabetes mellitus (HCC)   Chronic stable condition.  Well-controlled.  Hemoglobin A1c 6.6 Continue glipizide  and Jardiance  Continue atorvastatin  20 mg daily. Blood work done today Follow-up in 6  months        Other   Vaginal discharge   Chronic discharge Needs gynecology evaluation Referral placed today      Suspected sleep apnea   Snores, wakes up tired, has daytime somnolence Overall fatigue and malaise Occasional brain fog Recommend sleep studies Referral placed today      Relevant Orders   Ambulatory referral to Sleep Studies   Other Visit Diagnoses       Encounter for general adult medical examination with abnormal findings    -  Primary   Relevant Orders   Ambulatory referral to Gynecology      Modifiable risk factors discussed with patient. Anticipatory guidance according to age provided. The following topics were also discussed: Social Determinants of Health Smoking.  Non-smoker Diet and nutrition Benefits of exercise Cancer screening and review of most recent colonoscopy and mammogram reports Vaccinations review and recommendations Cardiovascular risk assessment The 10-year ASCVD risk score (Arnett DK, et al., 2019) is: 14.2%   Values used to calculate the score:     Age: 24 years     Clincally relevant sex: Female     Is Non-Hispanic African American: Yes     Diabetic: Yes     Tobacco smoker: No     Systolic Blood Pressure: 124 mmHg     Is BP treated: Yes     HDL Cholesterol: 35.1 mg/dL     Total Cholesterol: 134 mg/dL Review of multiple chronic medical conditions under management Review of all medications Mental health including depression and anxiety Fall and accident prevention  Patient Instructions  Health Maintenance, Female Adopting a healthy lifestyle and getting preventive care are important in promoting health and wellness. Ask your health care provider about: The right schedule for you to have regular tests and exams. Things you can do on your own to prevent diseases and keep yourself healthy. What should I know about diet, weight, and exercise? Eat a healthy diet  Eat a diet that includes plenty of vegetables, fruits,  low-fat dairy products, and lean protein. Do not eat a lot of foods that are high in solid fats, added sugars, or sodium. Maintain a healthy weight Body mass index (BMI) is used to identify weight problems. It estimates body fat based on height and weight. Your health care provider can help determine your BMI and help you achieve or maintain a healthy weight. Get regular exercise Get regular exercise. This is one of the most important things you can do for your health. Most adults should: Exercise for at least 150 minutes each week. The exercise should increase your heart rate and make you sweat (moderate-intensity exercise). Do strengthening exercises at least twice a week. This is in addition to the moderate-intensity exercise. Spend less time sitting. Even light physical activity can be beneficial. Watch cholesterol and blood lipids Have your blood tested for lipids and cholesterol at 63 years of age, then have this test every 5 years. Have your cholesterol levels checked more often if: Your lipid or cholesterol levels are high. You are older than 63 years of age. You are at high risk for heart disease. What should I know about cancer screening? Depending on your health history and family history, you may need to have cancer screening at various ages. This may  include screening for: Breast cancer. Cervical cancer. Colorectal cancer. Skin cancer. Lung cancer. What should I know about heart disease, diabetes, and high blood pressure? Blood pressure and heart disease High blood pressure causes heart disease and increases the risk of stroke. This is more likely to develop in people who have high blood pressure readings or are overweight. Have your blood pressure checked: Every 3-5 years if you are 22-54 years of age. Every year if you are 40 years old or older. Diabetes Have regular diabetes screenings. This checks your fasting blood sugar level. Have the screening done: Once every three  years after age 61 if you are at a normal weight and have a low risk for diabetes. More often and at a younger age if you are overweight or have a high risk for diabetes. What should I know about preventing infection? Hepatitis B If you have a higher risk for hepatitis B, you should be screened for this virus. Talk with your health care provider to find out if you are at risk for hepatitis B infection. Hepatitis C Testing is recommended for: Everyone born from [birth year] through 1965. Anyone with known risk factors for hepatitis C. Sexually transmitted infections (STIs) Get screened for STIs, including gonorrhea and chlamydia, if: You are sexually active and are younger than 63 years of age. You are older than 63 years of age and your health care provider tells you that you are at risk for this type of infection. Your sexual activity has changed since you were last screened, and you are at increased risk for chlamydia or gonorrhea. Ask your health care provider if you are at risk. Ask your health care provider about whether you are at high risk for HIV. Your health care provider may recommend a prescription medicine to help prevent HIV infection. If you choose to take medicine to prevent HIV, you should first get tested for HIV. You should then be tested every 3 months for as long as you are taking the medicine. Pregnancy If you are about to stop having your period (premenopausal) and you may become pregnant, seek counseling before you get pregnant. Take 400 to 800 micrograms (mcg) of folic acid every day if you become pregnant. Ask for birth control (contraception) if you want to prevent pregnancy. Osteoporosis and menopause Osteoporosis is a disease in which the bones lose minerals and strength with aging. This can result in bone fractures. If you are 76 years old or older, or if you are at risk for osteoporosis and fractures, ask your health care provider if you should: Be screened for bone  loss. Take a calcium  or vitamin D  supplement to lower your risk of fractures. Be given hormone replacement therapy (HRT) to treat symptoms of menopause. Follow these instructions at home: Alcohol use Do not drink alcohol if: Your health care provider tells you not to drink. You are pregnant, may be pregnant, or are planning to become pregnant. If you drink alcohol: Limit how much you have to: 0-1 drink a day. Know how much alcohol is in your drink. In the [place] one drink equals one 12 oz bottle of beer (355 mL), one 5 oz glass of wine (148 mL), or one 1 oz glass of hard liquor (44 mL). Lifestyle Do not use any products that contain nicotine or tobacco. These products include cigarettes, chewing tobacco, and vaping devices, such as e-cigarettes. If you need help quitting, ask your health care provider. Do not use street drugs. Do not share needles.  Ask your health care provider for help if you need support or information about quitting drugs. General instructions Schedule regular health, dental, and eye exams. Stay current with your vaccines. Tell your health care provider if: You often feel depressed. You have ever been abused or do not feel safe at home. Summary Adopting a healthy lifestyle and getting preventive care are important in promoting health and wellness. Follow your health care provider's instructions about healthy diet, exercising, and getting tested or screened for diseases. Follow your health care provider's instructions on monitoring your cholesterol and blood pressure. This information is not intended to replace advice given to you by your health care provider. Make sure you discuss any questions you have with your health care provider. Document Revised: 04/18/2021 Document Reviewed: 04/18/2021 Elsevier Patient Education  2024 Elsevier Inc.      Emil Schaumann, MD [facility]

## 2024-07-22 NOTE — Patient Instructions (Signed)

## 2024-07-22 NOTE — Assessment & Plan Note (Signed)
 Well-controlled hypertension Continue lisinopril  20 mg daily Well-controlled diabetes with hemoglobin A1c of 6.6 Continue Jardiance  25 mg daily and glipizide  5 mg twice a day Cardiovascular risks associated with hypertension and diabetes discussed Diet and nutrition discussed Blood work and urine done today Recommend follow-up in 6 months

## 2024-07-22 NOTE — Assessment & Plan Note (Signed)
 Snores, wakes up tired, has daytime somnolence Overall fatigue and malaise Occasional brain fog Recommend sleep studies Referral placed today

## 2024-07-22 NOTE — Assessment & Plan Note (Signed)
 Chronic stable condition.  Well-controlled.  Hemoglobin A1c 6.6 Continue glipizide  and Jardiance  Continue atorvastatin  20 mg daily. Blood work done today Follow-up in 6 months

## 2024-07-23 ENCOUNTER — Other Ambulatory Visit: Payer: Self-pay | Admitting: Surgery

## 2024-07-23 DIAGNOSIS — N6091 Unspecified benign mammary dysplasia of right breast: Secondary | ICD-10-CM

## 2024-07-24 ENCOUNTER — Encounter (HOSPITAL_COMMUNITY): Payer: Self-pay

## 2024-07-24 NOTE — Progress Notes (Signed)
 Surgical Instructions   Your procedure is scheduled on Wednesday, August 20th. Report to [facility] Main Entrance A at 10:00 A.M., then check in with the Admitting office. Any questions or running late day of surgery: call [phone]  Questions prior to your surgery date: call [phone], Monday-Friday, 8am-4pm. If you experience any cold or flu symptoms such as cough, fever, chills, shortness of breath, etc. between now and your scheduled surgery, please notify us  at the above number.     Remember:  Do not eat after midnight the night before your surgery   You may drink clear liquids until 9:00 the morning of your surgery.   Clear liquids allowed are: Water, Non-Citrus Juices (without pulp), Carbonated Beverages, Clear Tea (no milk, honey, etc.), Black Coffee Only (NO MILK, CREAM OR POWDERED CREAMER of any kind), and Gatorade.    Take these medicines the morning of surgery with A SIP OF WATER  atorvastatin  (LIPITOR)  omeprazole  (PRILOSEC)    One week prior to surgery, STOP taking any Aspirin (unless otherwise instructed by your surgeon) Aleve, Naproxen, Motrin , Goody's, BC's, all herbal medications, fish oil, and non-prescription vitamins.           WHAT DO I DO ABOUT MY DIABETES MEDICATION?   HOLD your empagliflozin  (JARDIANCE ) for 72 hours prior to your surgery.  Last dose should be taken on Saturday, August 16th.       HOW TO MANAGE YOUR DIABETES BEFORE AND AFTER SURGERY  Why is it important to control my blood sugar before and after surgery? Improving blood sugar levels before and after surgery helps healing and can limit problems. A way of improving blood sugar control is eating a healthy diet by:  Eating less sugar and carbohydrates  Increasing activity/exercise  Talking with your doctor about reaching your blood sugar goals High blood sugars (greater than 180 mg/dL) can raise your risk of infections and slow your recovery, so you will need to focus on  controlling your diabetes during the weeks before surgery. Make sure that the doctor who takes care of your diabetes knows about your planned surgery including the date and location.  How do I manage my blood sugar before surgery? Check your blood sugar at least 4 times a day, starting 2 days before surgery, to make sure that the level is not too high or low.  Check your blood sugar the morning of your surgery when you wake up and every 2 hours until you get to the Short Stay unit.  If your blood sugar is less than 70 mg/dL, you will need to treat for low blood sugar: Do not take insulin . Treat a low blood sugar (less than 70 mg/dL) with  cup of clear juice (cranberry or apple), 4 glucose tablets, OR glucose gel. Recheck blood sugar in 15 minutes after treatment (to make sure it is greater than 70 mg/dL). If your blood sugar is not greater than 70 mg/dL on recheck, call [phone] for further instructions. Report your blood sugar to the short stay nurse when you get to Short Stay.  If you are admitted to the hospital after surgery: Your blood sugar will be checked by the staff and you will probably be given insulin  after surgery (instead of oral diabetes medicines) to make sure you have good blood sugar levels. The goal for blood sugar control after surgery is 80-180 mg/dL.            Do NOT Smoke (Tobacco/Vaping) for 24 hours prior to your procedure.  If you use a CPAP at night, you may bring your mask/headgear for your overnight stay.   You will be asked to remove any contacts, glasses, piercing's, hearing aid's, dentures/partials prior to surgery. Please bring cases for these items if needed.    Patients discharged the day of surgery will not be allowed to drive home, and someone needs to stay with them for 24 hours.  SURGICAL WAITING ROOM VISITATION Patients may have no more than 2 support people in the waiting area - these visitors may rotate.   Pre-op nurse will coordinate an  appropriate time for 1 ADULT support person, who may not rotate, to accompany patient in pre-op.  Children under the age of 32 must have an adult with them who is not the patient and must remain in the main waiting area with an adult.  If the patient needs to stay at the hospital during part of their recovery, the visitor guidelines for inpatient rooms apply.  Please refer to the [facility] website for the visitor guidelines for any additional information.   If you received a COVID test during your pre-op visit  it is requested that you wear a mask when out in public, stay away from anyone that may not be feeling well and notify your surgeon if you develop symptoms. If you have been in contact with anyone that has tested positive in the last 10 days please notify you surgeon.      Pre-operative CHG Bathing Instructions   You can play a key role in reducing the risk of infection after surgery. Your skin needs to be as free of germs as possible. You can reduce the number of germs on your skin by washing with CHG (chlorhexidine  gluconate) soap before surgery. CHG is an antiseptic soap that kills germs and continues to kill germs even after washing.   DO NOT use if you have an allergy to chlorhexidine /CHG or antibacterial soaps. If your skin becomes reddened or irritated, stop using the CHG and notify one of our RNs at [phone].              TAKE A SHOWER THE NIGHT BEFORE SURGERY AND THE DAY OF SURGERY    Please keep in mind the following:  DO NOT shave, including legs and underarms, 48 hours prior to surgery.   You may shave your face before/day of surgery.  Place clean sheets on your bed the night before surgery Use a clean washcloth (not used since being washed) for each shower. DO NOT sleep with pet's night before surgery.  CHG Shower Instructions:  Wash your face and private area with normal soap. If you choose to wash your hair, wash first with your normal shampoo.  After you use  shampoo/soap, rinse your hair and body thoroughly to remove shampoo/soap residue.  Turn the water OFF and apply half the bottle of CHG soap to a CLEAN washcloth.  Apply CHG soap ONLY FROM YOUR NECK DOWN TO YOUR TOES (washing for 3-5 minutes)  DO NOT use CHG soap on face, private areas, open wounds, or sores.  Pay special attention to the area where your surgery is being performed.  If you are having back surgery, having someone wash your back for you may be helpful. Wait 2 minutes after CHG soap is applied, then you may rinse off the CHG soap.  Pat dry with a clean towel  Put on clean pajamas    Additional instructions for the day of surgery: DO NOT APPLY  any lotions, deodorants, cologne, or perfumes.   Do not wear jewelry or makeup Do not wear nail polish, gel polish, artificial nails, or any other type of covering on natural nails (fingers and toes) Do not bring valuables to the hospital. [facility] is not responsible for valuables/personal belongings. Put on clean/comfortable clothes.  Please brush your teeth.  Ask your nurse before applying any prescription medications to the skin.

## 2024-07-24 NOTE — Progress Notes (Signed)
 PCP - Dr Emil Schaumann Cardiologist - none  Chest x-ray - n/a EKG - 07/25/24 Stress Test - n/a ECHO - n/a Cardiac Cath - n/a  ICD Pacemaker/Loop - n/a  Sleep Study -  no but referral for sleep study initiated by PCP on 07/22/24.  Diabetes Type 2, does not check blood sugars Hold Jardiance  72 hours prior to procedure.  Last dose will be on Saturday, 07/26/24.  Aspirin & Blood Thinner Instructions:  n/a  ERAS - clear liquids til 9 AM DOS. PRE-SURGERY G2 drink given with instructions.-   Anesthesia review: Yes  STOP now taking any Aspirin (unless otherwise instructed by your surgeon), Aleve, Naproxen, Ibuprofen , Motrin , Advil , Goody's, BC's, all herbal medications, fish oil, and all vitamins.   Per patient Seed Placement scheduled on 07/29/24 at 8:30 AM.  Coronavirus Screening Do you have any of the following symptoms:  Cough yes/no: No Fever (>100.70F)  yes/no: No Runny nose yes/no: No Sore throat yes/no: No Difficulty breathing/shortness of breath  yes/no: No  Have you traveled in the last 14 days and where? yes/no: No  Patient verbalized understanding of instructions that were given to them at the PAT appointment. Patient was also instructed that they will need to review over the PAT instructions again at home before surgery.

## 2024-07-25 ENCOUNTER — Other Ambulatory Visit: Payer: Self-pay

## 2024-07-25 ENCOUNTER — Encounter (HOSPITAL_COMMUNITY): Payer: Self-pay

## 2024-07-25 ENCOUNTER — Encounter (HOSPITAL_COMMUNITY)
Admission: RE | Admit: 2024-07-25 | Discharge: 2024-07-25 | Disposition: A | Discharge status: Home / Self Care | Source: Ambulatory Visit | Attending: Surgery | Admitting: Surgery

## 2024-07-25 VITALS — BP 141/72 | HR 73 | Temp 98.4°F | Resp 18 | Ht 63.5 in | Wt 249.0 lb

## 2024-07-25 DIAGNOSIS — E669 Obesity, unspecified: Secondary | ICD-10-CM | POA: Diagnosis not present

## 2024-07-25 DIAGNOSIS — N6091 Unspecified benign mammary dysplasia of right breast: Secondary | ICD-10-CM | POA: Insufficient documentation

## 2024-07-25 DIAGNOSIS — K219 Gastro-esophageal reflux disease without esophagitis: Secondary | ICD-10-CM | POA: Diagnosis not present

## 2024-07-25 DIAGNOSIS — Z01818 Encounter for other preprocedural examination: Secondary | ICD-10-CM | POA: Diagnosis present

## 2024-07-25 DIAGNOSIS — E119 Type 2 diabetes mellitus without complications: Secondary | ICD-10-CM | POA: Insufficient documentation

## 2024-07-25 DIAGNOSIS — Z6841 Body Mass Index (BMI) 40.0 and over, adult: Secondary | ICD-10-CM | POA: Diagnosis not present

## 2024-07-25 DIAGNOSIS — I10 Essential (primary) hypertension: Secondary | ICD-10-CM | POA: Diagnosis not present

## 2024-07-25 DIAGNOSIS — Z7984 Long term (current) use of oral hypoglycemic drugs: Secondary | ICD-10-CM | POA: Insufficient documentation

## 2024-07-25 LAB — GLUCOSE, CAPILLARY: Glucose-Capillary: 123 mg/dL — ABNORMAL HIGH (ref 70–99)

## 2024-07-25 LAB — BASIC METABOLIC PANEL WITH GFR
Anion gap: 7 (ref 5–15)
BUN: 8 mg/dL (ref 8–23)
CO2: 27 mmol/L (ref 22–32)
Calcium: 9.3 mg/dL (ref 8.9–10.3)
Chloride: 111 mmol/L (ref 98–111)
Creatinine, Ser: 0.69 mg/dL (ref 0.44–1.00)
GFR, Estimated: >60 mL/min (ref >60)
Glucose, Bld: 126 mg/dL — ABNORMAL HIGH (ref 70–99)
Potassium: 3.7 mmol/L (ref 3.5–5.1)
Sodium: 145 mmol/L (ref 135–145)

## 2024-07-25 LAB — CBC
HCT: 44.2 % (ref 36.0–46.0)
Hemoglobin: 14 g/dL (ref 12.0–15.0)
MCH: 29.2 pg (ref 26.0–34.0)
MCHC: 31.7 g/dL (ref 30.0–36.0)
MCV: 92.3 fL (ref 80.0–100.0)
Platelets: 220 K/uL (ref 150–400)
RBC: 4.79 MIL/uL (ref 3.87–5.11)
RDW: 13.2 % (ref 11.5–15.5)
WBC: 6.5 K/uL (ref 4.0–10.5)
nRBC: 0 % (ref 0.0–0.2)

## 2024-07-25 LAB — HEMOGLOBIN A1C
Hgb A1c MFr Bld: 6.6 % — ABNORMAL HIGH (ref 4.8–5.6)
Mean Plasma Glucose: 142.72 mg/dL

## 2024-07-28 ENCOUNTER — Encounter (HOSPITAL_COMMUNITY): Payer: Self-pay

## 2024-07-28 NOTE — Progress Notes (Signed)
 Case: [id number] Date/Time: 07/30/24 1145   Procedure: RADIOACTIVE SEED GUIDED BREAST BIOPSY (Right: Breast) - LMA RADIOACTIVE SEED GUIDED EXCISION RIGHT BREAST ATYPICAL CELLS   Anesthesia type: General   Pre-op diagnosis:      ATYPICAL CELLS     ADH RIGHT BREAST   [facility] / [facility]   Surgeons: Vernetta Berg, MD       DISCUSSION: Anna Mann is a 63 year old female with past medical history of heart murmur, HTN, diabetes, GERD, obesity (BMI 43)  Seen by PCP on 07/22/2024 for daytime somnolence.  Sleep study ordered and pending.  Blood pressure noted to be well-controlled.  Diabetes controlled with A1c of 6.6.  Patient with heart murmur listed in medical history added in history in 2013. Review of multiple provider notes remark that there is no murmur on exam.  LD Jardiance : 8/16  VS: BP (!) 141/72   Pulse 73   Temp 36.9 C   Resp 18   Ht 5' 3.5 (1.613 m)   Wt 112.9 kg   LMP  (LMP Unknown) Comment: irregular  SpO2 98%   BMI 43.42 kg/m   PROVIDERS: Purcell Emil Schanz, MD   LABS: Labs reviewed: Acceptable for surgery. (all labs ordered are listed, but only abnormal results are displayed)  Labs Reviewed  GLUCOSE, CAPILLARY - Abnormal; Notable for the following components:      Result Value   Glucose-Capillary 123 (*)    All other components within normal limits  BASIC METABOLIC PANEL WITH GFR - Abnormal; Notable for the following components:   Glucose, Bld 126 (*)    All other components within normal limits  HEMOGLOBIN A1C - Abnormal; Notable for the following components:   Hgb A1c MFr Bld 6.6 (*)    All other components within normal limits  CBC     IMAGES:   EKG 07/25/24:  Normal sinus rhythm, rate 60 Normal ECG  CV:  Past Medical History:  Diagnosis Date   Abnormal Pap smear    colposcopy   Abnormal uterine bleeding (AUB)    Allergy    Diabetes mellitus without complication (HCC)    GERD (gastroesophageal reflux disease)     Heart murmur    Hyperlipidemia    Hypertension    Vertigo    hx    Past Surgical History:  Procedure Laterality Date   BREAST BIOPSY Right 07/10/2024   MM RT BREAST BX W LOC DEV 1ST LESION IMAGE BX SPEC STEREO GUIDE 07/10/2024 [facility]   COLONOSCOPY  2023   COLPOSCOPY     DILATATION & CURETTAGE/HYSTEROSCOPY WITH MYOSURE N/A 05/25/2021   Procedure: DILATATION & CURETTAGE/HYSTEROSCOPY WITH MYOSURE;  Surgeon: Storm Setter, DO;  Location: [facility];  Service: Gynecology;  Laterality: N/A;   DILATION AND CURETTAGE OF UTERUS     KNEE ARTHROSCOPY W/ ACL RECONSTRUCTION Left    LAPAROSCOPY N/A 05/25/2021   Procedure: LAPAROSCOPY DIAGNOSTIC,;  Surgeon: Storm Setter, DO;  Location: [facility];  Service: Gynecology;  Laterality: N/A;   TUBAL LIGATION     UPPER GI ENDOSCOPY  2023    MEDICATIONS:  atorvastatin  (LIPITOR) 20 MG tablet   cyclobenzaprine  (FLEXERIL ) 10 MG tablet   empagliflozin  (JARDIANCE ) 25 MG TABS tablet   glipiZIDE  (GLUCOTROL ) 5 MG tablet   glucose blood (TRUE METRIX BLOOD GLUCOSE TEST) test strip   ibuprofen  (ADVIL ) 200 MG tablet   ibuprofen  (ADVIL ) 600 MG tablet   lidocaine  (LIDODERM ) 5 %   lisinopril  (ZESTRIL ) 20  MG tablet   omeprazole  (PRILOSEC) 20 MG capsule   ondansetron  (ZOFRAN -ODT) 4 MG disintegrating tablet   TRUEplus Lancets 28G MISC   No current facility-administered medications for this encounter.   Burnard CHRISTELLA Odis DEVONNA [facility] Surgical Short Stay/Anesthesiology [facility] Phone [phone] 07/28/2024 2:22 PM

## 2024-07-28 NOTE — Anesthesia Preprocedure Evaluation (Signed)
 Anesthesia Evaluation  Patient identified by MRN, date of birth, ID band Patient awake    Reviewed: Allergy & Precautions, NPO status , Patient's Chart, lab work & pertinent test results  Airway Mallampati: II  TM Distance: >3 FB Neck ROM: Full    Dental  (+) Teeth Intact, Dental Advisory Given   Pulmonary neg pulmonary ROS   Pulmonary exam normal breath sounds clear to auscultation       Cardiovascular hypertension, Pt. on medications (-) angina (-) Past MI Normal cardiovascular exam Rhythm:Regular Rate:Normal     Neuro/Psych negative neurological ROS  negative psych ROS   GI/Hepatic Neg liver ROS,GERD  Medicated and Poorly Controlled,,  Endo/Other  diabetes, Type 2, Oral Hypoglycemic Agents  Class 3 obesity  Renal/GU negative Renal ROS     Musculoskeletal negative musculoskeletal ROS (+)    Abdominal   Peds  Hematology negative hematology ROS (+)   Anesthesia Other Findings Day of surgery medications reviewed with the patient.  ATYPICAL CELLS      ADH RIGHT BREAST    Reproductive/Obstetrics                              Anesthesia Physical Anesthesia Plan  ASA: 3  Anesthesia Plan: General   Post-op Pain Management: Tylenol  PO (pre-op)* and Toradol  IV (intra-op)*   Induction: Intravenous  PONV Risk Score and Plan: 3 and Midazolam , Dexamethasone  and Ondansetron   Airway Management Planned: Oral ETT  Additional Equipment:   Intra-op Plan:   Post-operative Plan: Extubation in OR  Informed Consent: I have reviewed the patients History and Physical, chart, labs and discussed the procedure including the risks, benefits and alternatives for the proposed anesthesia with the patient or authorized representative who has indicated his/her understanding and acceptance.     Dental advisory given  Plan Discussed with: CRNA  Anesthesia Plan Comments: (See PAT note from 8/15)          Anesthesia Quick Evaluation

## 2024-07-29 ENCOUNTER — Ambulatory Visit
Admission: RE | Admit: 2024-07-29 | Discharge: 2024-07-29 | Disposition: A | Discharge status: Home / Self Care | Source: Ambulatory Visit | Attending: Surgery | Admitting: Surgery

## 2024-07-29 DIAGNOSIS — N6091 Unspecified benign mammary dysplasia of right breast: Secondary | ICD-10-CM

## 2024-07-29 HISTORY — PX: BREAST BIOPSY: SHX20

## 2024-07-29 NOTE — H&P (Signed)
 REFERRING PHYSICIAN: Purcell Rubin, MD PROVIDER: VICENTA DASIE POLI, MD MRN: [id number] DOB: 01/22/1961 DATE OF ENCOUNTER: 07/21/2024 Subjective   Chief Complaint: New Consultation (Rt flat epithelial atypia with severe atypia and focal atypical ductal hyperplasia)  History of Present Illness: Anna Mann is a 63 y.o. female who is seen today as an office consultation for evaluation of New Consultation (Rt flat epithelial atypia with severe atypia and focal atypical ductal hyperplasia)  This is a 63 year old female who had undergone a recent screening mammography showing a 2.5 cm area of calcifications in the right breast in the upper outer quadrant. She underwent a biopsy of this showing severe atypia with atypical ductal hyperplasia borderlining on early ductal carcinoma in situ. She has had no previous problems regarding her breast. She has a family history of breast cancer and ovarian cancer. She is otherwise without complaints. She has had no previous problems regarding her breast. She denies nipple discharge.  Review of Systems: A complete review of systems was obtained from the patient. I have reviewed this information and discussed as appropriate with the patient. See HPI as well for other ROS.  ROS   Medical History: Past Medical History:  Diagnosis Date  Anxiety  Diabetes mellitus without complication (CMS/HHS-HCC)  GERD (gastroesophageal reflux disease)  Hypertension   There is no problem list on file for this patient.  Past Surgical History:  Procedure Laterality Date  ACL Reconstruction    Allergies  Allergen Reactions  Iodinated Contrast Media Anaphylaxis, Cough, Hives, Itching, Rash and Swelling  Iodine Cough, Hives, Shortness Of Breath and Swelling  Shellfish Containing Products Anaphylaxis, Cough, Hives, Itching, Rash, Shortness Of Breath and Swelling   Current Outpatient Medications on File Prior to Visit  Medication Sig Dispense Refill   atorvastatin  (LIPITOR) 20 MG tablet Take 20 mg by mouth once daily  empagliflozin  (JARDIANCE ) 25 mg tablet Take 25 mg by mouth once daily  lisinopriL  (ZESTRIL ) 20 MG tablet Take 20 mg by mouth once daily   No current facility-administered medications on file prior to visit.   Family History  Problem Relation Age of Onset  High blood pressure (Hypertension) Mother  Hyperlipidemia (Elevated cholesterol) Mother  Coronary Artery Disease (Blocked arteries around heart) Mother  Diabetes Mother  Deep vein thrombosis (DVT or abnormal blood clot formation) Mother  Colon cancer Father  High blood pressure (Hypertension) Sister  Hyperlipidemia (Elevated cholesterol) Sister  Coronary Artery Disease (Blocked arteries around heart) Sister  Diabetes Sister  Coronary Artery Disease (Blocked arteries around heart) Brother  Diabetes Brother  Hyperlipidemia (Elevated cholesterol) Brother  High blood pressure (Hypertension) Brother  Diabetes Maternal Aunt  Colon cancer Maternal Aunt  Stroke Paternal Grandmother    Social History   Tobacco Use  Smoking Status Never  Smokeless Tobacco Never    Social History   Socioeconomic History  Marital status: Unknown  Tobacco Use  Smoking status: Never  Smokeless tobacco: Never  Vaping Use  Vaping status: Never Used  Substance and Sexual Activity  Drug use: Never   Social Drivers of Corporate investment banker Strain: Low Risk (06/02/2024)  Received from [facility]  Overall Financial Resource Strain (CARDIA)  How hard is it for you to pay for the very basics like food, housing, medical care, and heating?: Not hard at all  Food Insecurity: No Food Insecurity (06/02/2024)  Received from [facility]  Hunger Vital Sign  Within the past 12 months, you worried that your food would run out before you got the money  to buy more.: Never true  Within the past 12 months, the food you bought just didn't last and you didn't have money to get more.: Never  true  Transportation Needs: No Transportation Needs (06/02/2024)  Received from [facility] - Transportation  In the past 12 months, has lack of transportation kept you from medical appointments or from getting medications?: No  In the past 12 months, has lack of transportation kept you from meetings, work, or from getting things needed for daily living?: No  Physical Activity: Unknown (06/02/2024)  Received from [facility]  Exercise Vital Sign  On average, how many days per week do you engage in moderate to strenuous exercise (like a brisk walk)?: Patient declined  Stress: Stress Concern Present (06/02/2024)  Received from [facility] - Occupational Stress Questionnaire  Do you feel stress - tense, restless, nervous, or anxious, or unable to sleep at night because your mind is troubled all the time - these days?: To some extent  Social Connections: Moderately Integrated (06/02/2024)  Received from [facility]  Social Connection and Isolation Panel  In a typical week, how many times do you talk on the phone with family, friends, or neighbors?: More than three times a week  How often do you get together with friends or relatives?: More than three times a week  How often do you attend church or religious services?: More than 4 times per year  Do you belong to any clubs or organizations such as church groups, unions, fraternal or athletic groups, or school groups?: Yes  How often do you attend meetings of the clubs or organizations you belong to?: More than 4 times per year  Are you married, widowed, divorced, separated, never married, or living with a partner?: Divorced  Housing Stability: Unknown (07/21/2024)  Housing Stability Vital Sign  Homeless in the Last Year: No   Objective:   Vitals:  07/21/24 1139  BP: 117/73  Pulse: 99  Temp: 36.9 C (98.4 F)  SpO2: 99%  Weight: (!) 112.7 kg (248 lb 6.4 oz)  Height: 161.3 cm (5' 3.5)   PainSc: 0-No pain   Body mass index is 43.31 kg/m.  Physical Exam   A chaperone was present for the exam.  Appears well on exam.  There are no palpable breast masses. She has a mild amount of ecchymosis in the upper outer quadrant of the right breast from her biopsy. The nipple areolar complex is normal.  There is no axillary adenopathy.  Labs, Imaging and Diagnostic Testing: Reviewed her mammograms, ultrasound, and pathology results.  Assessment and Plan:   Diagnoses and all orders for this visit:  Atypical ductal hyperplasia of right breast   She does have significant severe atypia of her right breast in the 2.5 cm area of calcifications. Excision of this area in the right breast is strongly recommended for complete histologic evaluation to rule out a malignancy. We discussed proceeding with a radioactive seed guided right breast excision of this area in the breast. I explained the surgical procedure in detail. We discussed the risks which includes but is not limited to bleeding, infection, injury to surrounding structures, the need for further surgery if this is found, cardiopulmonary issues with anesthesia, DVT, postoperative recovery, etc. Again, she understands and wishes to proceed with surgery which will be scheduled

## 2024-07-30 ENCOUNTER — Ambulatory Visit (HOSPITAL_COMMUNITY): Admitting: Certified Registered Nurse Anesthetist

## 2024-07-30 ENCOUNTER — Ambulatory Visit (HOSPITAL_COMMUNITY)
Admission: RE | Admit: 2024-07-30 | Discharge: 2024-07-30 | Disposition: A | Discharge status: Home / Self Care | Attending: Surgery | Admitting: Surgery

## 2024-07-30 ENCOUNTER — Other Ambulatory Visit: Payer: Self-pay

## 2024-07-30 ENCOUNTER — Encounter (HOSPITAL_COMMUNITY): Payer: Self-pay | Admitting: Surgery

## 2024-07-30 ENCOUNTER — Encounter (HOSPITAL_COMMUNITY)
Admission: RE | Disposition: A | Discharge status: Home / Self Care | Payer: Self-pay | Source: Home / Self Care | Attending: Surgery

## 2024-07-30 ENCOUNTER — Inpatient Hospital Stay
Admission: RE | Admit: 2024-07-30 | Discharge: 2024-07-30 | Discharge status: Home / Self Care | Source: Ambulatory Visit | Attending: Surgery | Admitting: Surgery

## 2024-07-30 ENCOUNTER — Ambulatory Visit (HOSPITAL_COMMUNITY): Payer: Self-pay | Admitting: Medical

## 2024-07-30 DIAGNOSIS — E119 Type 2 diabetes mellitus without complications: Secondary | ICD-10-CM

## 2024-07-30 DIAGNOSIS — N6091 Unspecified benign mammary dysplasia of right breast: Secondary | ICD-10-CM

## 2024-07-30 DIAGNOSIS — D0511 Intraductal carcinoma in situ of right breast: Secondary | ICD-10-CM | POA: Diagnosis not present

## 2024-07-30 DIAGNOSIS — Z7984 Long term (current) use of oral hypoglycemic drugs: Secondary | ICD-10-CM | POA: Diagnosis not present

## 2024-07-30 DIAGNOSIS — Z803 Family history of malignant neoplasm of breast: Secondary | ICD-10-CM | POA: Diagnosis not present

## 2024-07-30 DIAGNOSIS — I10 Essential (primary) hypertension: Secondary | ICD-10-CM | POA: Diagnosis not present

## 2024-07-30 DIAGNOSIS — Z79899 Other long term (current) drug therapy: Secondary | ICD-10-CM | POA: Insufficient documentation

## 2024-07-30 DIAGNOSIS — Z6841 Body Mass Index (BMI) 40.0 and over, adult: Secondary | ICD-10-CM | POA: Diagnosis not present

## 2024-07-30 DIAGNOSIS — Z8041 Family history of malignant neoplasm of ovary: Secondary | ICD-10-CM | POA: Diagnosis not present

## 2024-07-30 DIAGNOSIS — E66813 Obesity, class 3: Secondary | ICD-10-CM | POA: Insufficient documentation

## 2024-07-30 DIAGNOSIS — K219 Gastro-esophageal reflux disease without esophagitis: Secondary | ICD-10-CM | POA: Diagnosis not present

## 2024-07-30 DIAGNOSIS — N6489 Other specified disorders of breast: Secondary | ICD-10-CM | POA: Diagnosis present

## 2024-07-30 LAB — GLUCOSE, CAPILLARY
Glucose-Capillary: 137 mg/dL — ABNORMAL HIGH (ref 70–99)
Glucose-Capillary: 137 mg/dL — ABNORMAL HIGH (ref 70–99)

## 2024-07-30 SURGERY — RADIOACTIVE SEED GUIDED BREAST BIOPSY
Anesthesia: General | Site: Breast | Laterality: Right

## 2024-07-30 MED ORDER — ROCURONIUM BROMIDE 100 MG/10ML IV SOLN
INTRAVENOUS | Status: DC | PRN
  Administered 2024-07-30: 40 mg via INTRAVENOUS

## 2024-07-30 MED ORDER — KETOROLAC TROMETHAMINE 15 MG/ML IJ SOLN
INTRAMUSCULAR | Status: DC | PRN
  Administered 2024-07-30: 15 mg via INTRAVENOUS

## 2024-07-30 MED ORDER — 0.9 % SODIUM CHLORIDE (POUR BTL) OPTIME
TOPICAL | Status: DC | PRN
  Administered 2024-07-30: 1000 mL

## 2024-07-30 MED ORDER — MIDAZOLAM HCL 2 MG/2ML IJ SOLN
INTRAMUSCULAR | Status: DC | PRN
  Administered 2024-07-30: 2 mg via INTRAVENOUS

## 2024-07-30 MED ORDER — CEFAZOLIN SODIUM-DEXTROSE 2-4 GM/100ML-% IV SOLN
2.0000 g | INTRAVENOUS | Status: AC
  Administered 2024-07-30: 2 g via INTRAVENOUS
  Filled 2024-07-30: qty 100

## 2024-07-30 MED ORDER — CHLORHEXIDINE GLUCONATE 0.12 % MT SOLN
OROMUCOSAL | Status: AC
  Administered 2024-07-30: 15 mL
  Filled 2024-07-30: qty 15

## 2024-07-30 MED ORDER — AMISULPRIDE (ANTIEMETIC) 5 MG/2ML IV SOLN: 10.0000 mg | Freq: Once | INTRAVENOUS | Status: DC | PRN

## 2024-07-30 MED ORDER — ENSURE PRE-SURGERY PO LIQD: 296.0000 mL | Freq: Once | ORAL | Status: DC

## 2024-07-30 MED ORDER — SOD CITRATE-CITRIC ACID 500-334 MG/5ML PO SOLN
30.0000 mL | Freq: Once | ORAL | Status: AC
  Administered 2024-07-30: 30 mL via ORAL

## 2024-07-30 MED ORDER — CHLORHEXIDINE GLUCONATE CLOTH 2 % EX PADS: 6.0000 | MEDICATED_PAD | Freq: Once | CUTANEOUS | Status: DC

## 2024-07-30 MED ORDER — BUPIVACAINE-EPINEPHRINE 0.25% -1:200000 IJ SOLN
INTRAMUSCULAR | Status: DC | PRN
  Administered 2024-07-30: 20 mL

## 2024-07-30 MED ORDER — PROPOFOL 10 MG/ML IV BOLUS
INTRAVENOUS | Status: AC
  Filled 2024-07-30: qty 20

## 2024-07-30 MED ORDER — SUGAMMADEX SODIUM 200 MG/2ML IV SOLN
INTRAVENOUS | Status: DC | PRN
  Administered 2024-07-30: 250 mg via INTRAVENOUS
  Administered 2024-07-30 (×2): 50 mg via INTRAVENOUS

## 2024-07-30 MED ORDER — FENTANYL CITRATE (PF) 250 MCG/5ML IJ SOLN
INTRAMUSCULAR | Status: DC | PRN
  Administered 2024-07-30: 50 ug via INTRAVENOUS
  Administered 2024-07-30: 100 ug via INTRAVENOUS

## 2024-07-30 MED ORDER — SUCCINYLCHOLINE CHLORIDE 200 MG/10ML IV SOSY
PREFILLED_SYRINGE | INTRAVENOUS | Status: DC | PRN
  Administered 2024-07-30: 140 mg via INTRAVENOUS

## 2024-07-30 MED ORDER — FENTANYL CITRATE (PF) 250 MCG/5ML IJ SOLN
INTRAMUSCULAR | Status: AC
Start: 2024-07-30 — End: 2024-07-30
  Filled 2024-07-30: qty 5

## 2024-07-30 MED ORDER — SOD CITRATE-CITRIC ACID 500-334 MG/5ML PO SOLN
ORAL | Status: AC
  Filled 2024-07-30: qty 30

## 2024-07-30 MED ORDER — TRAMADOL HCL 50 MG PO TABS: 50.0000 mg | ORAL_TABLET | Freq: Four times a day (QID) | ORAL | 0 refills | Status: DC | PRN

## 2024-07-30 MED ORDER — BUPIVACAINE-EPINEPHRINE (PF) 0.25% -1:200000 IJ SOLN
INTRAMUSCULAR | Status: AC
  Filled 2024-07-30: qty 30

## 2024-07-30 MED ORDER — PROPOFOL 10 MG/ML IV BOLUS
INTRAVENOUS | Status: DC | PRN
  Administered 2024-07-30: 200 mg via INTRAVENOUS

## 2024-07-30 MED ORDER — MIDAZOLAM HCL 2 MG/2ML IJ SOLN
INTRAMUSCULAR | Status: AC
  Filled 2024-07-30: qty 2

## 2024-07-30 MED ORDER — ACETAMINOPHEN 500 MG PO TABS
1000.0000 mg | ORAL_TABLET | ORAL | Status: AC
  Administered 2024-07-30: 1000 mg via ORAL
  Filled 2024-07-30: qty 2

## 2024-07-30 MED ORDER — ONDANSETRON HCL 4 MG/2ML IJ SOLN: 4.0000 mg | Freq: Once | INTRAMUSCULAR | Status: DC | PRN

## 2024-07-30 MED ORDER — DEXAMETHASONE SODIUM PHOSPHATE 10 MG/ML IJ SOLN
INTRAMUSCULAR | Status: DC | PRN
  Administered 2024-07-30: 5 mg via INTRAVENOUS

## 2024-07-30 MED ORDER — FENTANYL CITRATE (PF) 100 MCG/2ML IJ SOLN: 25.0000 ug | INTRAMUSCULAR | Status: DC | PRN

## 2024-07-30 MED ORDER — ONDANSETRON HCL 4 MG/2ML IJ SOLN
INTRAMUSCULAR | Status: DC | PRN
  Administered 2024-07-30: 4 mg via INTRAVENOUS

## 2024-07-30 MED ORDER — LIDOCAINE 2% (20 MG/ML) 5 ML SYRINGE
INTRAMUSCULAR | Status: DC | PRN
  Administered 2024-07-30: 100 mg via INTRAVENOUS

## 2024-07-30 MED ORDER — INSULIN ASPART 100 UNIT/ML IJ SOLN: 0.0000 [IU] | INTRAMUSCULAR | Status: DC | PRN

## 2024-07-30 MED ORDER — LACTATED RINGERS IV SOLN
INTRAVENOUS | Status: DC | PRN
Start: 2024-07-30 — End: 2024-07-30

## 2024-07-30 SURGICAL SUPPLY — 25 items
BAG COUNTER SPONGE SURGICOUNT (BAG) ×1 IMPLANT
CANISTER SUCTION 3000ML PPV (SUCTIONS) ×1 IMPLANT
CHLORAPREP W/TINT 26 (MISCELLANEOUS) ×1 IMPLANT
CLIP APPLIE 9.375 MED OPEN (MISCELLANEOUS) ×1 IMPLANT
COVER PROBE W GEL 5X96 (DRAPES) ×1 IMPLANT
COVER SURGICAL LIGHT HANDLE (MISCELLANEOUS) ×1 IMPLANT
DERMABOND ADVANCED .7 DNX12 (GAUZE/BANDAGES/DRESSINGS) ×1 IMPLANT
DEVICE DUBIN SPECIMEN MAMMOGRA (MISCELLANEOUS) ×1 IMPLANT
DRAPE CHEST BREAST 15X10 FENES (DRAPES) ×1 IMPLANT
ELECT CAUTERY BLADE 6.4 (BLADE) ×1 IMPLANT
ELECTRODE REM PT RTRN 9FT ADLT (ELECTROSURGICAL) ×1 IMPLANT
GLOVE SURG SIGNA 7.5 PF LTX (GLOVE) ×1 IMPLANT
GOWN STRL REUS W/ TWL LRG LVL3 (GOWN DISPOSABLE) ×1 IMPLANT
GOWN STRL REUS W/ TWL XL LVL3 (GOWN DISPOSABLE) ×1 IMPLANT
KIT BASIN OR (CUSTOM PROCEDURE TRAY) ×1 IMPLANT
KIT MARKER MARGIN INK (KITS) ×1 IMPLANT
NDL HYPO 25GX1X1/2 BEV (NEEDLE) ×1 IMPLANT
NEEDLE HYPO 25GX1X1/2 BEV (NEEDLE) ×1 IMPLANT
NS IRRIG 1000ML POUR BTL (IV SOLUTION) IMPLANT
PACK GENERAL/GYN (CUSTOM PROCEDURE TRAY) ×1 IMPLANT
SUT MNCRL AB 4-0 PS2 18 (SUTURE) ×1 IMPLANT
SUT VIC AB 3-0 SH 18 (SUTURE) ×1 IMPLANT
SYR CONTROL 10ML LL (SYRINGE) ×1 IMPLANT
TOWEL GREEN STERILE (TOWEL DISPOSABLE) ×1 IMPLANT
TOWEL GREEN STERILE FF (TOWEL DISPOSABLE) ×1 IMPLANT

## 2024-07-30 NOTE — Anesthesia Procedure Notes (Signed)
 Procedure Name: Intubation Date/Time: 07/30/2024 11:43 AM  Performed by: Boyce Shilling, CRNAPre-anesthesia Checklist: Patient identified, Emergency Drugs available, Suction available, Timeout performed and Patient being monitored Patient Re-evaluated:Patient Re-evaluated prior to induction Oxygen Delivery Method: Circle system utilized Preoxygenation: Pre-oxygenation with 100% oxygen Induction Type: IV induction, Rapid sequence and Cricoid Pressure applied Ventilation: Mask ventilation without difficulty Laryngoscope Size: Mac and 4 Grade View: Grade I Tube type: Oral Tube size: 7.0 mm Number of attempts: 1 Airway Equipment and Method: Stylet Placement Confirmation: ETT inserted through vocal cords under direct vision, positive ETCO2, CO2 detector and breath sounds checked- equal and bilateral Secured at: 22 cm Tube secured with: Tape Dental Injury: Teeth and Oropharynx as per pre-operative assessment

## 2024-07-30 NOTE — Discharge Instructions (Signed)
[facility] Office Phone Number [phone]  BREAST BIOPSY/ PARTIAL MASTECTOMY: POST OP INSTRUCTIONS  Always review your discharge instruction sheet given to you by the facility where your surgery was performed.  IF YOU HAVE DISABILITY OR FAMILY LEAVE FORMS, YOU MUST BRING THEM TO THE OFFICE FOR PROCESSING.  DO NOT GIVE THEM TO YOUR DOCTOR.  A prescription for pain medication may be given to you upon discharge.  Take your pain medication as prescribed, if needed.  If narcotic pain medicine is not needed, then you may take acetaminophen (Tylenol) or ibuprofen (Advil) as needed. Take your usually prescribed medications unless otherwise directed If you need a refill on your pain medication, please contact your pharmacy.  They will contact our office to request authorization.  Prescriptions will not be filled after 5pm or on week-ends. You should eat very light the first 24 hours after surgery, such as soup, crackers, pudding, etc.  Resume your normal diet the day after surgery. Most patients will experience some swelling and bruising in the breast.  Ice packs and a good support bra will help.  Swelling and bruising can take several days to resolve.  It is common to experience some constipation if taking pain medication after surgery.  Increasing fluid intake and taking a stool softener will usually help or prevent this problem from occurring.  A mild laxative (Milk of Magnesia or Miralax) should be taken according to package directions if there are no bowel movements after 48 hours. Unless discharge instructions indicate otherwise, you may remove your bandages 24-48 hours after surgery, and you may shower at that time.  You may have steri-strips (small skin tapes) in place directly over the incision.  These strips should be left on the skin for 7-10 days.  If your surgeon used skin glue on the incision, you may shower in 24 hours.  The glue will flake off over the next 2-3 weeks.  Any  sutures or staples will be removed at the office during your follow-up visit. ACTIVITIES:  You may resume regular daily activities (gradually increasing) beginning the next day.  Wearing a good support bra or sports bra minimizes pain and swelling.  You may have sexual intercourse when it is comfortable. You may drive when you no longer are taking prescription pain medication, you can comfortably wear a seatbelt, and you can safely maneuver your car and apply brakes. RETURN TO WORK:  ______________________________________________________________________________________ You should see your doctor in the office for a follow-up appointment approximately two weeks after your surgery.  Your doctor's nurse will typically make your follow-up appointment when she calls you with your pathology report.  Expect your pathology report 2-3 business days after your surgery.  You may call to check if you do not hear from us after three days. OTHER INSTRUCTIONS: YOU MAY SHOWER STARTING TOMORROW ICE PACK, TYLENOL, AND IBUPROFEN ALSO FOR PAIN NO VIGOROUS ACTIVITY FOR ONE WEEK _______________________________________________________________________________________________ _____________________________________________________________________________________________________________________________________ _____________________________________________________________________________________________________________________________________ _____________________________________________________________________________________________________________________________________  WHEN TO CALL YOUR DOCTOR: Fever over 101.0 Nausea and/or vomiting. Extreme swelling or bruising. Continued bleeding from incision. Increased pain, redness, or drainage from the incision.  The clinic staff is available to answer your questions during regular business hours.  Please don't hesitate to call and ask to speak to one of the nurses for clinical  concerns.  If you have a medical emergency, go to the nearest emergency room or call 911.  A surgeon from [facility] is always on call at the hospital.  For further questions, please visit   [email or web address]  

## 2024-07-30 NOTE — Anesthesia Postprocedure Evaluation (Signed)
 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  Procedure(s) Performed: RADIOACTIVE SEED GUIDED BREAST BIOPSY (Right: Breast)     Patient location during evaluation: PACU Anesthesia Type: General Level of consciousness: awake and alert Pain management: pain level controlled Vital Signs Assessment: post-procedure vital signs reviewed and stable Respiratory status: spontaneous breathing, nonlabored ventilation, respiratory function stable and patient connected to nasal cannula oxygen Cardiovascular status: blood pressure returned to baseline and stable Postop Assessment: no apparent nausea or vomiting Anesthetic complications: no   No notable events documented.  Last Vitals:  Vitals:   07/30/24 1245 07/30/24 1301  BP: (!) 155/74 (!) 145/79  Pulse: 83 62  Resp: 18 (!) 9  Temp:  36.6 C  SpO2: 95% 97%    Last Pain:  Vitals:   07/30/24 1230  TempSrc:   PainSc: 0-No pain                 Epifanio Lamar BRAVO

## 2024-07-30 NOTE — Interval H&P Note (Signed)
 History and Physical Interval Note:NO CHANGE IN H AND P  07/30/2024 10:48 AM  Anna Mann  has presented today for surgery, with the diagnosis of ATYPICAL CELLS ADH RIGHT BREAST.  The various methods of treatment have been discussed with the patient and family. After consideration of risks, benefits and other options for treatment, the patient has consented to  Procedure(s) with comments: RADIOACTIVE SEED GUIDED BREAST BIOPSY (Right) - LMA RADIOACTIVE SEED GUIDED EXCISION RIGHT BREAST ATYPICAL CELLS as a surgical intervention.  The patient's history has been reviewed, patient examined, no change in status, stable for surgery.  I have reviewed the patient's chart and labs.  Questions were answered to the patient's satisfaction.     Vicenta Poli

## 2024-07-30 NOTE — Transfer of Care (Signed)
 Immediate Anesthesia Transfer of Care Note  Patient: Anna Mann  Procedure(s) Performed: RADIOACTIVE SEED GUIDED BREAST BIOPSY (Right: Breast)  Patient Location: PACU  Anesthesia Type:General  Level of Consciousness: awake, alert , and oriented  Airway & Oxygen Therapy: Patient Spontanous Breathing and Patient connected to face mask oxygen  Post-op Assessment: Report given to RN and Post -op Vital signs reviewed and stable  Post vital signs: Reviewed and stable  Last Vitals:  Vitals Value Taken Time  BP 146/66 07/30/24 12:31  Temp    Pulse 76 07/30/24 12:34  Resp 15 07/30/24 12:34  SpO2 95 % 07/30/24 12:34  Vitals shown include unfiled device data.  Last Pain:  Vitals:   07/30/24 1107  TempSrc:   PainSc: 10-Worst pain ever      Patients Stated Pain Goal: 2 (07/30/24 1049)  Complications: No notable events documented.

## 2024-07-30 NOTE — Op Note (Signed)
   Anna Mann 07/30/2024   Pre-op Diagnosis: ATYPICAL DUCTAL HYPERPLASIA RIGHT BREAST     Post-op Diagnosis: SAME  Procedure(s): RADIOACTIVE SEED GUIDED BREAST BIOPSY  Surgeon(s): Vernetta Berg, MD Maczis, Tonja Barban, PA-C  Anesthesia: General  Staff:  Circulator: Storm Maryelizabeth CROME, RN Scrub Person: Shona Jon POUR, RN Circulator Assistant: Deette Thedora LABOR, RN  Estimated Blood Loss: Minimal               Specimens: SENT TO PATH  Indications: This is a 63 year old female was found to have an 2.5 cm area of calcifications in the upper outer quadrant of the right breast.  A biopsy showed severe atypia with atypical ductal hyperplasia.  Excision of this area was strongly recommended for complete histologic evaluation to rule out early malignancy  Procedure: The patient was brought to the operating room and identified the correct patient.  She was placed upon the operating table and general anesthesia was induced.  Her right breast was prepped and draped in usual sterile fashion.  I anesthetized the skin in the upper outer quadrant of the right breast over the signal from the radioactive seed using the neoprobe.  I then made an elliptical incision with a scalpel.  I then dissected down into the breast tissue circumferentially with electrocautery.  With the aid of the neoprobe I then performed a wide lumpectomy staying widely around the radioactive seed going down to near the chest wall.  I then completed the dissection coming underneath the signal from the radioactive seed with the electrocautery.  Once I completed the lumpectomy I marked all margins with paint.  An x-ray was performed from the radioactive seed and previous biopsy clip were in the specimen.  The specimen was sent to pathology for evaluation.  We achieved hemostasis with cautery.  We anesthetized the incision further with Marcaine .  Surgical clips were placed around the periphery of the lumpectomy cavity for marking  purposes.  We then closed the deep tissue with 1 single 3-0 Vicryl suture.  The subcutaneous tissue was closed interrupted 3-0 Vicryl sutures and the skin was closed with a running 4-0 Monocryl.  Dermabond was then applied.  The patient tolerated the procedure well.  All the counts were correct at the end of the procedure.  The patient was then extubated in the operating room and taken in a stable condition to the recovery room.          Berg Vernetta   Date: 07/30/2024  Time: 12:12 PM

## 2024-08-05 LAB — SURGICAL PATHOLOGY

## 2024-08-07 ENCOUNTER — Encounter: Admitting: Family Medicine

## 2024-08-08 NOTE — Progress Notes (Signed)
 Location of Breast Cancer: Intraductal Carcinoma in Situ of Right Breast    Histology per Pathology Report:    Receptor Status: ER(100), PR (80),  Did patient present with symptoms (if so, please note symptoms) or was this found on screening mammography?:  Mammogram   Past/Anticipated interventions by surgeon, if any: 07/30/2024 Vernetta, MD Right Radioactive Seed Guided Breast Biopsy Radioactive Seed Guided Excision of Right Breast    Past/Anticipated interventions by medical oncology, if any:   Lymphedema issues, if any:    None   Pain issues, if any:  None   SAFETY ISSUES: Prior radiation? None Pacemaker/ICD? None Possible current pregnancy?None Is the patient on methotrexate? None  Current Complaints / other details:   None

## 2024-08-10 ENCOUNTER — Ambulatory Visit: Admission: EM | Admit: 2024-08-10 | Discharge: 2024-08-10 | Disposition: A | Discharge status: Home / Self Care

## 2024-08-10 ENCOUNTER — Encounter: Payer: Self-pay | Admitting: Emergency Medicine

## 2024-08-10 ENCOUNTER — Ambulatory Visit

## 2024-08-10 DIAGNOSIS — N898 Other specified noninflammatory disorders of vagina: Secondary | ICD-10-CM | POA: Insufficient documentation

## 2024-08-10 MED ORDER — FLUCONAZOLE 150 MG PO TABS: 150.0000 mg | ORAL_TABLET | Freq: Every day | ORAL | 0 refills | Status: DC

## 2024-08-10 NOTE — ED Provider Notes (Signed)
 [facility]  Note:  This document was prepared using Conservation officer, historic buildings and may include unintentional dictation errors.  MRN: [id number] DOB: Jun 08, 1961  Subjective:   Anna Mann is a 63 y.o. female presenting for vaginal irritation, itching, thin white vaginal discharge x 1 week.  Patient denies any abnormal odor, dysuria, frequency, abdominal pain, flank pain.  Patient states that she had a vaginal yeast infection and bacterial vaginosis in May with similar symptoms to present.  Patient is not tried any over-the-counter medication or treatment for symptoms.  No current facility-administered medications for this encounter.  Current Outpatient Medications:    atorvastatin  (LIPITOR) 20 MG tablet, Take 1 tablet (20 mg total) by mouth daily., Disp: 90 tablet, Rfl: 3   empagliflozin  (JARDIANCE ) 25 MG TABS tablet, Take 1 tablet (25 mg total) by mouth daily., Disp: 90 tablet, Rfl: 3   fluconazole  (DIFLUCAN ) 150 MG tablet, Take 1 tablet (150 mg total) by mouth daily. If symptoms persist take second dose of Diflucan  150 mg on day 3 to treat vaginal Candida., Disp: 2 tablet, Rfl: 0   ibuprofen  (ADVIL ) 200 MG tablet, Take 200 mg by mouth every 6 (six) hours as needed for moderate pain (pain score 4-6)., Disp: , Rfl:    lisinopril  (ZESTRIL ) 20 MG tablet, Take 1 tablet (20 mg total) by mouth daily., Disp: 90 tablet, Rfl: 3   omeprazole  (PRILOSEC) 20 MG capsule, Take 1 capsule (20 mg total) by mouth 2 (two) times daily before a meal., Disp: 180 capsule, Rfl: 1   cyclobenzaprine  (FLEXERIL ) 10 MG tablet, Take 1 tablet (10 mg total) by mouth 3 (three) times daily as needed for muscle spasms. (Patient not taking: Reported on 07/24/2024), Disp: 30 tablet, Rfl: 0   glipiZIDE  (GLUCOTROL ) 5 MG tablet, Take 1 tablet (5 mg total) by mouth 2 (two) times daily before a meal. (Patient not taking: Reported on 08/10/2024), Disp: 60 tablet, Rfl: 3   glucose blood (TRUE METRIX BLOOD GLUCOSE TEST)  test strip, Use as instructed. Check blood glucose level by fingerstick twice per day.  E11.65 (Patient not taking: Reported on 08/10/2024), Disp: 100 each, Rfl: 12   ibuprofen  (ADVIL ) 600 MG tablet, Take 1 tablet (600 mg total) by mouth every 6 (six) hours as needed. (Patient not taking: Reported on 07/24/2024), Disp: 30 tablet, Rfl: 0   lidocaine  (LIDODERM ) 5 %, Place 1 patch onto the skin daily. Remove & Discard patch within 12 hours or as directed by MD (Patient not taking: Reported on 07/24/2024), Disp: 14 patch, Rfl: 0   ondansetron  (ZOFRAN -ODT) 4 MG disintegrating tablet, Dissolve 1 tablet (4 mg total) by mouth every 8 (eight) hours as needed for nausea or vomiting. (Patient not taking: Reported on 07/24/2024), Disp: 30 tablet, Rfl: 1   traMADol  (ULTRAM ) 50 MG tablet, Take 1 tablet (50 mg total) by mouth every 6 (six) hours as needed for moderate pain (pain score 4-6) or severe pain (pain score 7-10). (Patient not taking: Reported on 08/10/2024), Disp: 25 tablet, Rfl: 0   TRUEplus Lancets 28G MISC, Use as instructed. Check blood glucose level by fingerstick twice per day. (Patient not taking: Reported on 08/10/2024), Disp: 100 each, Rfl: 3   Allergies  Allergen Reactions   Iodine Cough, Hives, Shortness Of Breath, Swelling and Anaphylaxis   Ivp Dye [Iodinated Contrast Media] Anaphylaxis, Hives, Itching and Swelling   Shellfish Allergy Anaphylaxis, Hives and Itching    Past Medical History:  Diagnosis Date   Abnormal Pap smear  colposcopy   Abnormal uterine bleeding (AUB)    Allergy    Diabetes mellitus without complication (HCC)    GERD (gastroesophageal reflux disease)    Heart murmur    Hyperlipidemia    Hypertension    Vertigo    hx     Past Surgical History:  Procedure Laterality Date   BREAST BIOPSY Right 07/10/2024   MM RT BREAST BX W LOC DEV 1ST LESION IMAGE BX SPEC STEREO GUIDE 07/10/2024 [facility]   BREAST BIOPSY  07/29/2024   MM RT RADIOACTIVE SEED LOC MAMMO  GUIDE 07/29/2024 [facility]   COLONOSCOPY  2023   COLPOSCOPY     DILATATION & CURETTAGE/HYSTEROSCOPY WITH MYOSURE N/A 05/25/2021   Procedure: DILATATION & CURETTAGE/HYSTEROSCOPY WITH MYOSURE;  Surgeon: Storm Setter, DO;  Location: [facility];  Service: Gynecology;  Laterality: N/A;   DILATION AND CURETTAGE OF UTERUS     KNEE ARTHROSCOPY W/ ACL RECONSTRUCTION Left    LAPAROSCOPY N/A 05/25/2021   Procedure: LAPAROSCOPY DIAGNOSTIC,;  Surgeon: Storm Setter, DO;  Location: [facility];  Service: Gynecology;  Laterality: N/A;   TUBAL LIGATION     UPPER GI ENDOSCOPY  2023    Family History  Problem Relation Age of Onset   Aneurysm Mother    Heart disease Mother    Diabetes Mother    Hypertension Mother    Colon cancer Father        colon   Diabetes Sister    Kidney failure Sister    Heart failure Sister    Hodgkin's lymphoma Sister    Diabetes Brother    Heart disease Brother    Congestive Heart Failure Brother    Heart disease Brother    Diabetes Brother    Pancreatitis Brother    Heart failure Brother    Diabetes Maternal Grandmother    Hypertension Maternal Grandmother    Congestive Heart Failure Maternal Grandmother    Heart disease Paternal Grandmother    Hypertension Paternal Grandmother    Stroke Paternal Grandfather    Ovarian cancer Maternal Aunt        2021   Colon cancer Maternal Aunt    Esophageal cancer Paternal Aunt    Hodgkin's lymphoma Niece    Diabetes Daughter    Other Daughter        pre diabetic   Kidney failure Nephew    Diabetes Nephew    Breast cancer Cousin    Colon polyps Neg Hx    Stomach cancer Neg Hx     Social History   Tobacco Use   Smoking status: Never    Passive exposure: Never   Smokeless tobacco: Never  Vaping Use   Vaping status: Never Used  Substance Use Topics   Alcohol use: No   Drug use: No    ROS Refer to HPI for ROS details.  Objective:   Vitals: BP 133/81 (BP Location:  Left Arm)   Pulse 71   Temp 98 F (36.7 C) (Oral)   Resp 14   LMP  (LMP Unknown) Comment: irregular  SpO2 96%   Physical Exam Vitals and nursing note reviewed.  Constitutional:      General: She is not in acute distress.    Appearance: Normal appearance. She is well-developed. She is not ill-appearing or toxic-appearing.  HENT:     Head: Normocephalic and atraumatic.  Cardiovascular:     Rate and Rhythm: Normal rate.  Pulmonary:     Effort: Pulmonary effort is  normal. No respiratory distress.  Abdominal:     General: There is no distension.     Palpations: Abdomen is soft.     Tenderness: There is no abdominal tenderness. There is no right CVA tenderness, left CVA tenderness, guarding or rebound.  Genitourinary:    Vagina: Vaginal discharge present.  Skin:    General: Skin is warm and dry.  Neurological:     General: No focal deficit present.     Mental Status: She is alert and oriented to person, place, and time.  Psychiatric:        Mood and Affect: Mood normal.        Behavior: Behavior normal.     Procedures  No results found for this or any previous visit (from the past 24 hours).  No results found.   Assessment and Plan :     Discharge Instructions       1. Vaginal discharge (Primary) - Cervicovaginal swab collected in UC and sent to lab for further testing results should be available in 2 to 3 days. - Based on symptoms described by patient vaginal discharge is most likely secondary to yeast infection, empiric treatment with Diflucan  initiated. - fluconazole  (DIFLUCAN ) 150 MG tablet; Take 1 tablet (150 mg total) by mouth daily. If symptoms persist take second dose of Diflucan  150 mg on day 3 to treat vaginal Candida.  Dispense: 2 tablet; Refill: 0 -Continue to monitor symptoms for any change in severity if there is any escalation of current symptoms or development of new symptoms follow-up in ER for further evaluation and management.       Canda Podgorski B  Logen Heintzelman   Isayah Ignasiak, [place], [place] 08/10/24 1311

## 2024-08-10 NOTE — ED Triage Notes (Signed)
 Pt reports vaginal itching and thin, watery white discharge x 1 week. Pt denies odor, dysuria, frequency, and abdominal pain. Notes she had a combination of BV and yeast last May that felt very similar to current symptoms. No med use for current symptoms.

## 2024-08-10 NOTE — Discharge Instructions (Signed)
  1. Vaginal discharge (Primary) - Cervicovaginal swab collected in UC and sent to lab for further testing results should be available in 2 to 3 days. - Based on symptoms described by patient vaginal discharge is most likely secondary to yeast infection, empiric treatment with Diflucan  initiated. - fluconazole  (DIFLUCAN ) 150 MG tablet; Take 1 tablet (150 mg total) by mouth daily. If symptoms persist take second dose of Diflucan  150 mg on day 3 to treat vaginal Candida.  Dispense: 2 tablet; Refill: 0 -Continue to monitor symptoms for any change in severity if there is any escalation of current symptoms or development of new symptoms follow-up in ER for further evaluation and management.

## 2024-08-12 LAB — CERVICOVAGINAL ANCILLARY ONLY
Bacterial Vaginitis (gardnerella): NEGATIVE
Candida Glabrata: POSITIVE — AB
Candida Vaginitis: POSITIVE — AB
Chlamydia: NEGATIVE
Comment: NEGATIVE
Comment: NEGATIVE
Comment: NEGATIVE
Comment: NEGATIVE
Comment: NEGATIVE
Comment: NORMAL
Neisseria Gonorrhea: NEGATIVE
Trichomonas: NEGATIVE

## 2024-08-12 NOTE — Progress Notes (Signed)
 Radiation Oncology         [phone] ________________________________  Initial outpatient Consultation  Name: Anna Mann MRN: [id number]  Date: 08/13/2024  DOB: Apr 21, 1961  RR:Djhjmipj, Emil Schanz, MD  Vernetta Berg, MD   REFERRING PHYSICIAN: Vernetta Berg, MD  DIAGNOSIS: No diagnosis found.   Cancer Staging  No matching staging information was found for the patient.   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 female who presented with breast abnormality on the following imaging: bilateral screening mammogram on the date of 05/13/24.  No symptoms, if any, at that time, were reported. Ultrasound of breast on 07/08/24 revealed an indeterminate 2.5 cm group of calcifications in the upper outer quadrant of the right breast at anterior to middle depth with linear and branching patterns.   Subsequently, she underwent a needle core biopsy of right breast on date of 07/10/24 showed flat epithelial atypia with severe atypia and focal atypical ductal hyperplasia with microcalcifications present in flat epithelial atypia.   In light of findings, she was referred to Dr. Vernetta on 07/21/24 to discuss further treatment plan. Upon discussion, she opted to proceed with a surgical approach to further investigate her findings.  As such, she underwent a radioactive seed guided biopsy on 07/30/24. Surgical pathology indicated tumor size of 5 mm with histology of high nuclear grade DCIS clinging and cribriform types with necrosis and calcifications present. DCIS is present in 4/10 sections. All margins are negative for carcinoma, closest margin: 5.5 mm from superior margins.Prognostic indicators significant for: estrogen receptor 100%, positive, strong staining intensity; progesterone receptor 80%, POSITIVE, strong staining intensity; Her2  status not applicable;    Immunohistochemical stain for myoepithelial markers (SMM and calponin) were performed on A4, A5, and A7. The myoepithelial expression is intact in the periphery of the proliferation confirming the diagnosis of ductal carcinoma in situ with no evidence of invasion.   Patient's family history is significant for breast cancer and ovarian cancer.   Of note: she presented to the UC on 08/10/24 complaining of vaginal discharge accompanied by itching. Treatment with Diflucan  and fluconazole  was initiated.    PREVIOUS RADIATION THERAPY: No  PAST MEDICAL HISTORY:  has a past medical history of Abnormal Pap smear, Abnormal uterine bleeding (AUB), Allergy, Diabetes mellitus without complication (HCC), GERD (gastroesophageal reflux disease), Heart murmur, Hyperlipidemia, Hypertension, and Vertigo.    PAST SURGICAL HISTORY: Past Surgical History:  Procedure Laterality Date   BREAST BIOPSY Right 07/10/2024   MM RT BREAST BX W LOC DEV 1ST LESION IMAGE BX SPEC STEREO GUIDE 07/10/2024 [facility]   BREAST BIOPSY  07/29/2024   MM RT RADIOACTIVE SEED LOC MAMMO GUIDE 07/29/2024 [facility]   COLONOSCOPY  2023   COLPOSCOPY     DILATATION & CURETTAGE/HYSTEROSCOPY WITH MYOSURE N/A 05/25/2021   Procedure: DILATATION & CURETTAGE/HYSTEROSCOPY WITH MYOSURE;  Surgeon: Storm Setter, DO;  Location: [facility];  Service: Gynecology;  Laterality: N/A;   DILATION AND CURETTAGE OF UTERUS     KNEE ARTHROSCOPY W/ ACL RECONSTRUCTION Left    LAPAROSCOPY N/A 05/25/2021   Procedure: LAPAROSCOPY DIAGNOSTIC,;  Surgeon: Storm Setter, DO;  Location: [facility];  Service: Gynecology;  Laterality: N/A;   TUBAL LIGATION     UPPER GI ENDOSCOPY  2023    FAMILY HISTORY: family history includes Aneurysm in her mother;  Breast cancer in her cousin; Colon cancer in her father and maternal aunt; Congestive Heart Failure in her brother and maternal grandmother; Diabetes  in her brother, brother, daughter, maternal grandmother, mother, nephew, and sister; Esophageal cancer in her paternal aunt; Heart disease in her brother, brother, mother, and paternal grandmother; Heart failure in her brother and sister; Hodgkin's lymphoma in her niece and sister; Hypertension in her maternal grandmother, mother, and paternal grandmother; Kidney failure in her nephew and sister; Other in her daughter; Ovarian cancer in her maternal aunt; Pancreatitis in her brother; Stroke in her paternal grandfather.  SOCIAL HISTORY:  reports that she has never smoked. She has never been exposed to tobacco smoke. She has never used smokeless tobacco. She reports that she does not drink alcohol and does not use drugs.  ALLERGIES: Iodine, Ivp dye [iodinated contrast media], and Shellfish allergy  MEDICATIONS:  Current Outpatient Medications  Medication Sig Dispense Refill   atorvastatin  (LIPITOR) 20 MG tablet Take 1 tablet (20 mg total) by mouth daily. 90 tablet 3   cyclobenzaprine  (FLEXERIL ) 10 MG tablet Take 1 tablet (10 mg total) by mouth 3 (three) times daily as needed for muscle spasms. (Patient not taking: Reported on 07/24/2024) 30 tablet 0   empagliflozin  (JARDIANCE ) 25 MG TABS tablet Take 1 tablet (25 mg total) by mouth daily. 90 tablet 3   fluconazole  (DIFLUCAN ) 150 MG tablet Take 1 tablet (150 mg total) by mouth daily. If symptoms persist take second dose of Diflucan  150 mg on day 3 to treat vaginal Candida. 2 tablet 0   glipiZIDE  (GLUCOTROL ) 5 MG tablet Take 1 tablet (5 mg total) by mouth 2 (two) times daily before a meal. (Patient not taking: Reported on 08/10/2024) 60 tablet 3   glucose blood (TRUE METRIX BLOOD GLUCOSE TEST) test strip Use as instructed. Check blood glucose level by fingerstick twice per day.  E11.65 (Patient not taking: Reported on 08/10/2024) 100 each 12   ibuprofen  (ADVIL ) 200 MG tablet Take 200 mg by mouth every 6 (six) hours as needed for moderate pain (pain score  4-6).     ibuprofen  (ADVIL ) 600 MG tablet Take 1 tablet (600 mg total) by mouth every 6 (six) hours as needed. (Patient not taking: Reported on 07/24/2024) 30 tablet 0   lidocaine  (LIDODERM ) 5 % Place 1 patch onto the skin daily. Remove & Discard patch within 12 hours or as directed by MD (Patient not taking: Reported on 07/24/2024) 14 patch 0   lisinopril  (ZESTRIL ) 20 MG tablet Take 1 tablet (20 mg total) by mouth daily. 90 tablet 3   omeprazole  (PRILOSEC) 20 MG capsule Take 1 capsule (20 mg total) by mouth 2 (two) times daily before a meal. 180 capsule 1   ondansetron  (ZOFRAN -ODT) 4 MG disintegrating tablet Dissolve 1 tablet (4 mg total) by mouth every 8 (eight) hours as needed for nausea or vomiting. (Patient not taking: Reported on 07/24/2024) 30 tablet 1   traMADol  (ULTRAM ) 50 MG tablet Take 1 tablet (50 mg total) by mouth every 6 (six) hours as needed for moderate pain (pain score 4-6) or severe pain (pain score 7-10). (Patient not taking: Reported on 08/10/2024) 25 tablet 0   TRUEplus Lancets 28G MISC Use as instructed. Check blood glucose level by fingerstick twice per day. (Patient not taking: Reported on 08/10/2024) 100 each 3   No current facility-administered medications for this encounter.    REVIEW OF SYSTEMS: As above in HPI.   PHYSICAL EXAM:  vitals were not taken for  this visit.   General: Alert and oriented, in no acute distress HEENT: Head is normocephalic. Extraocular movements are intact.  Heart: Regular in rate and rhythm with no murmurs, rubs, or gallops. Chest: Clear to auscultation bilaterally, with no rhonchi, wheezes, or rales. Abdomen: Soft, nontender, nondistended, with no rigidity or guarding. Extremities: No cyanosis or edema. Skin: No concerning lesions. Musculoskeletal: symmetric strength and muscle tone throughout. Neurologic: Cranial nerves II through XII are grossly intact. No obvious focalities. Speech is fluent. Coordination is intact. Psychiatric: Judgment  and insight are intact. Affect is appropriate. Breasts: *** . No other palpable masses appreciated in the breasts or axillae *** .    ECOG = ***  0 - Asymptomatic (Fully active, able to carry on all predisease activities without restriction)  1 - Symptomatic but completely ambulatory (Restricted in physically strenuous activity but ambulatory and able to carry out work of a light or sedentary nature. For example, light housework, office work)  2 - Symptomatic, <50% in bed during the day (Ambulatory and capable of all self care but unable to carry out any work activities. Up and about more than 50% of waking hours)  3 - Symptomatic, >50% in bed, but not bedbound (Capable of only limited self-care, confined to bed or chair 50% or more of waking hours)  4 - Bedbound (Completely disabled. Cannot carry on any self-care. Totally confined to bed or chair)  5 - Death   Raylene MM, Creech RH, Tormey DC, et al. [phone]). Toxicity and response criteria of the [facility]. Am. DOROTHA Bridges. Oncol. 5 (6): 649-55   LABORATORY DATA:   CBC    Component Value Date/Time   WBC 6.5 07/25/2024 1530   RBC 4.79 07/25/2024 1530   HGB 14.0 07/25/2024 1530   HGB 11.7 02/16/2020 1431   HCT 44.2 07/25/2024 1530   HCT 35.1 02/16/2020 1431   PLT 220 07/25/2024 1530   PLT 184 02/16/2020 1431   MCV 92.3 07/25/2024 1530   MCV 89 02/16/2020 1431   MCH 29.2 07/25/2024 1530   MCHC 31.7 07/25/2024 1530   RDW 13.2 07/25/2024 1530   RDW 13.0 02/16/2020 1431   LYMPHSABS 2.2 06/03/2024 1035   MONOABS 0.4 06/03/2024 1035   EOSABS 0.2 06/03/2024 1035   BASOSABS 0.0 06/03/2024 1035    CMP     Component Value Date/Time   NA 145 07/25/2024 1530   NA 143 02/16/2020 1431   K 3.7 07/25/2024 1530   CL 111 07/25/2024 1530   CO2 27 07/25/2024 1530   GLUCOSE 126 (H) 07/25/2024 1530   BUN 8 07/25/2024 1530   BUN 5 (L) 02/16/2020 1431   CREATININE 0.69 07/25/2024 1530   CALCIUM  9.3 07/25/2024 1530    PROT 6.9 06/03/2024 1035   PROT 6.3 02/16/2020 1431   ALBUMIN 4.1 06/03/2024 1035   ALBUMIN 4.0 02/16/2020 1431   AST 17 06/03/2024 1035   ALT 15 06/03/2024 1035   ALKPHOS 55 06/03/2024 1035   BILITOT 0.5 06/03/2024 1035   BILITOT <0.2 02/16/2020 1431   GFR 89.18 06/03/2024 1035   GFRNONAA >60 07/25/2024 1530      RADIOGRAPHY: MM Breast Surgical Specimen Result Date: 07/30/2024 CLINICAL DATA:  Evaluate surgical specimen following excision of RIGHT breast ADH. EXAM: SPECIMEN RADIOGRAPH OF THE RIGHT BREAST COMPARISON:  Previous exam(s). FINDINGS: Status post excision of the RIGHT breast. The radioactive seed and X biopsy clip are present and intact. IMPRESSION: Specimen radiograph of the RIGHT breast. Electronically Signed  By: Reyes Phi M.D.   On: 07/30/2024 12:49   MM RT RADIOACTIVE SEED LOC MAMMO GUIDE Result Date: 07/29/2024 CLINICAL DATA:  63 year old female presents for radioactive seed localization of RIGHT breast ADH. EXAM: MAMMOGRAPHIC GUIDED RADIOACTIVE SEED LOCALIZATION OF THE RIGHT BREAST COMPARISON:  Previous exam(s). FINDINGS: Patient presents for radioactive seed localization prior to RIGHT breast excision. I met with the patient and we discussed the procedure of seed localization including benefits and alternatives. We discussed the high likelihood of a successful procedure. We discussed the risks of the procedure including infection, bleeding, tissue injury and further surgery. We discussed the low dose of radioactivity involved in the procedure. Informed, written consent was given. The usual time-out protocol was performed immediately prior to the procedure. Using mammographic guidance, sterile technique, 1% lidocaine  and an I-125 radioactive seed, the X biopsy clip was localized using a LATERAL approach. The follow-up mammogram images confirm the seed in the expected location and were marked for Dr. Vernetta. Follow-up survey of the patient confirms presence of the  radioactive seed. Order number of I-125 seed:  [id number]. Total activity:  0.241 millicuries.  Reference Date: 06/23/2024. The patient tolerated the procedure well and was [facility]. She was given instructions regarding seed removal. IMPRESSION: Radioactive seed localization of the RIGHT breast. No apparent complications. Electronically Signed   By: Reyes Phi M.D.   On: 07/29/2024 10:03      IMPRESSION/PLAN: ***   It was a pleasure meeting the patient today. We discussed the risks, benefits, and side effects of radiotherapy. I recommend radiotherapy to the *** to reduce her risk of locoregional recurrence by 2/3.  We discussed that radiation would take approximately *** weeks to complete and that I would give the patient a few weeks to heal following surgery before starting treatment planning. *** If chemotherapy were to be given, this would precede radiotherapy. We spoke about acute effects including skin irritation and fatigue as well as much less common late effects including internal organ injury or irritation. We spoke about the latest technology that is used to minimize the risk of late effects for patients undergoing radiotherapy to the breast or chest wall. No guarantees of treatment were given. The patient is enthusiastic about proceeding with treatment. I look forward to participating in the patient's care.  I will await her referral back to me for postoperative follow-up and eventual CT simulation/treatment planning.  On date of service, in total, I spent *** minutes on this encounter. Patient was seen in person.   __________________________________________   Lauraine Golden, MD  This document serves as a record of services personally performed by Lauraine Golden, MD. It was created on her behalf by Reymundo Cartwright, a trained medical scribe. The creation of this record is based on the scribe's personal observations and the provider's statements to them. This document has been  checked and approved by the attending provider.

## 2024-08-13 ENCOUNTER — Ambulatory Visit
Admission: RE | Admit: 2024-08-13 | Discharge: 2024-08-13 | Disposition: A | Discharge status: Home / Self Care | Source: Ambulatory Visit | Attending: Radiation Oncology | Admitting: Radiation Oncology

## 2024-08-13 ENCOUNTER — Ambulatory Visit (HOSPITAL_COMMUNITY): Payer: Self-pay

## 2024-08-13 ENCOUNTER — Encounter: Payer: Self-pay | Admitting: Radiation Oncology

## 2024-08-13 ENCOUNTER — Inpatient Hospital Stay: Attending: Hematology and Oncology | Admitting: Hematology and Oncology

## 2024-08-13 VITALS — BP 123/85 | HR 87 | Temp 97.8°F | Resp 20 | Ht 63.5 in | Wt 247.0 lb

## 2024-08-13 VITALS — BP 121/67 | HR 88 | Temp 99.4°F | Resp 16 | Wt 246.2 lb

## 2024-08-13 DIAGNOSIS — Z170 Estrogen receptor positive status [ER+]: Secondary | ICD-10-CM | POA: Diagnosis not present

## 2024-08-13 DIAGNOSIS — R011 Cardiac murmur, unspecified: Secondary | ICD-10-CM | POA: Insufficient documentation

## 2024-08-13 DIAGNOSIS — E785 Hyperlipidemia, unspecified: Secondary | ICD-10-CM | POA: Diagnosis not present

## 2024-08-13 DIAGNOSIS — L299 Pruritus, unspecified: Secondary | ICD-10-CM | POA: Insufficient documentation

## 2024-08-13 DIAGNOSIS — I10 Essential (primary) hypertension: Secondary | ICD-10-CM | POA: Diagnosis not present

## 2024-08-13 DIAGNOSIS — Z1721 Progesterone receptor positive status: Secondary | ICD-10-CM | POA: Insufficient documentation

## 2024-08-13 DIAGNOSIS — N898 Other specified noninflammatory disorders of vagina: Secondary | ICD-10-CM | POA: Diagnosis not present

## 2024-08-13 DIAGNOSIS — Z800 Family history of malignant neoplasm of digestive organs: Secondary | ICD-10-CM | POA: Diagnosis not present

## 2024-08-13 DIAGNOSIS — Z923 Personal history of irradiation: Secondary | ICD-10-CM | POA: Insufficient documentation

## 2024-08-13 DIAGNOSIS — Z79899 Other long term (current) drug therapy: Secondary | ICD-10-CM | POA: Diagnosis not present

## 2024-08-13 DIAGNOSIS — D0511 Intraductal carcinoma in situ of right breast: Secondary | ICD-10-CM | POA: Diagnosis present

## 2024-08-13 DIAGNOSIS — E119 Type 2 diabetes mellitus without complications: Secondary | ICD-10-CM | POA: Insufficient documentation

## 2024-08-13 DIAGNOSIS — K219 Gastro-esophageal reflux disease without esophagitis: Secondary | ICD-10-CM | POA: Insufficient documentation

## 2024-08-13 DIAGNOSIS — Z7984 Long term (current) use of oral hypoglycemic drugs: Secondary | ICD-10-CM | POA: Insufficient documentation

## 2024-08-13 DIAGNOSIS — Z803 Family history of malignant neoplasm of breast: Secondary | ICD-10-CM | POA: Diagnosis not present

## 2024-08-13 NOTE — Assessment & Plan Note (Signed)
 07/30/2024: Screening mammogram detected right breast calcifications spanning 2.5 cm, biopsy: Focal epithelial atypia with ADH?  DCIS; right lumpectomy: High-grade DCIS with necrosis and calcifications 5 mm, margins negative ER 100%, PR 80%  Pathology review: I discussed with the patient the difference between DCIS and invasive breast cancer. It is considered a precancerous lesion. DCIS is classified as a 0. It is generally detected through mammograms as calcifications. We discussed the significance of grades and its impact on prognosis. We also discussed the importance of ER and PR receptors and their implications to adjuvant treatment options. Prognosis of DCIS dependence on grade, comedo necrosis. It is anticipated that if not treated, 20-30% of DCIS can develop into invasive breast cancer.  Recommendation: 1. adjuvant radiation therapy 2. Followed by antiestrogen therapy with tamoxifen 5 years  Tamoxifen counseling: We discussed the risks and benefits of tamoxifen. These include but not limited to insomnia, hot flashes, mood changes, vaginal dryness, and weight gain. Although rare, serious side effects including endometrial cancer, risk of blood clots were also discussed. We strongly believe that the benefits far outweigh the risks. Patient understands these risks and consented to starting treatment. Planned treatment duration is 5 years.  Return to clinic after radiation is completed

## 2024-08-13 NOTE — Progress Notes (Signed)
 [facility] CONSULT NOTE  Patient Care Team: Purcell Emil Schanz, MD as PCP - General (Internal Medicine)  CHIEF COMPLAINTS/PURPOSE OF CONSULTATION:  Newly diagnosed breast cancer  HISTORY OF PRESENTING ILLNESS:   History of Present Illness Anna Mann is a 63 year old female with ductal carcinoma in situ (DCIS) who presents for follow-up after radiation therapy.  She was diagnosed with DCIS after a mammogram revealed an area of disturbance, confirmed by biopsy. The lesion measured 0.5 cm and was classified as grade 2, with positive estrogen and progesterone receptors. She has completed radiation therapy.  There is concern about using tamoxifen due to her history of uterine fibroids, polyps, and previous postmenopausal bleeding. She underwent two surgeries for postmenopausal bleeding, the last one two years ago. She previously took Megestrol  for postmenopausal bleeding but has discontinued it. She experiences occasional spotting, which she associates with constipation and straining.  She is awaiting a referral to a gynecologist to evaluate her uterine health and discuss the suitability of tamoxifen or alternative medications like anastrozole.     I reviewed her records extensively and collaborated the history with the patient.  SUMMARY OF ONCOLOGIC HISTORY: Oncology History  Ductal carcinoma in situ (DCIS) of right breast  07/30/2024 Initial Diagnosis   Screening mammogram detected right breast calcifications spanning 2.5 cm, biopsy: Focal epithelial atypia with ADH?  DCIS; right lumpectomy: High-grade DCIS with necrosis and calcifications 5 mm, margins negative ER 100%, PR 80%   08/13/2024 Cancer Staging   Staging form: Breast, AJCC 8th Edition - Clinical: Stage 0 (cTis (DCIS), cN0, cM0, ER+, PR+, HER2: Not Assessed) - Signed by Odean Potts, MD on 08/13/2024 Stage prefix: Initial diagnosis Nuclear grade: G3      MEDICAL HISTORY:  Past Medical History:   Diagnosis Date   Abnormal Pap smear    colposcopy   Abnormal uterine bleeding (AUB)    Allergy    Diabetes mellitus without complication (HCC)    GERD (gastroesophageal reflux disease)    Heart murmur    Hyperlipidemia    Hypertension    Vertigo    hx    SURGICAL HISTORY: Past Surgical History:  Procedure Laterality Date   BREAST BIOPSY Right 07/10/2024   MM RT BREAST BX W LOC DEV 1ST LESION IMAGE BX SPEC STEREO GUIDE 07/10/2024 [facility]   BREAST BIOPSY  07/29/2024   MM RT RADIOACTIVE SEED LOC MAMMO GUIDE 07/29/2024 [facility]   COLONOSCOPY  2023   COLPOSCOPY     DILATATION & CURETTAGE/HYSTEROSCOPY WITH MYOSURE N/A 05/25/2021   Procedure: DILATATION & CURETTAGE/HYSTEROSCOPY WITH MYOSURE;  Surgeon: Storm Setter, DO;  Location: [facility];  Service: Gynecology;  Laterality: N/A;   DILATION AND CURETTAGE OF UTERUS     KNEE ARTHROSCOPY W/ ACL RECONSTRUCTION Left    LAPAROSCOPY N/A 05/25/2021   Procedure: LAPAROSCOPY DIAGNOSTIC,;  Surgeon: Storm Setter, DO;  Location: [facility];  Service: Gynecology;  Laterality: N/A;   TUBAL LIGATION     UPPER GI ENDOSCOPY  2023    SOCIAL HISTORY: Social History   Socioeconomic History   Marital status: Single    Spouse name: Not on file   Number of children: 3   Years of education: Not on file   Highest education level: Associate degree: occupational, Scientist, product/process development, or vocational program  Occupational History   Occupation: Psychologist, sport and exercise rep  Tobacco Use   Smoking status: Never    Passive exposure: Never   Smokeless tobacco: Never  Vaping Use  Vaping status: Never Used  Substance and Sexual Activity   Alcohol use: No   Drug use: No   Sexual activity: Not Currently    Birth control/protection: Post-menopausal  Other Topics Concern   Not on file  Social History Narrative   Not on file   Social Drivers of Health   Financial Resource Strain: Low Risk  (06/02/2024)   Overall  Financial Resource Strain (CARDIA)    Difficulty of Paying Living Expenses: Not hard at all  Food Insecurity: No Food Insecurity (08/13/2024)   Hunger Vital Sign    Worried About Running Out of Food in the Last Year: Never true    Ran Out of Food in the Last Year: Never true  Transportation Needs: No Transportation Needs (08/13/2024)   PRAPARE - Administrator, Civil Service (Medical): No    Lack of Transportation (Non-Medical): No  Physical Activity: Unknown (06/02/2024)   Exercise Vital Sign    Days of Exercise per Week: Patient declined    Minutes of Exercise per Session: Not on file  Recent Concern: Physical Activity - Insufficiently Active (03/12/2024)   Exercise Vital Sign    Days of Exercise per Week: 1 day    Minutes of Exercise per Session: 10 min  Stress: Stress Concern Present (06/02/2024)   [employer] of Occupational Health - Occupational Stress Questionnaire    Feeling of Stress: To some extent  Social Connections: Moderately Integrated (06/02/2024)   Social Connection and Isolation Panel    Frequency of Communication with Friends and Family: More than three times a week    Frequency of Social Gatherings with Friends and Family: More than three times a week    Attends Religious Services: More than 4 times per year    Active Member of [employer] or Organizations: Yes    Attends Engineer, structural: More than 4 times per year    Marital Status: Divorced  Catering manager Violence: Not At Risk (08/13/2024)   Humiliation, Afraid, Rape, and Kick questionnaire    Fear of Current or Ex-Partner: No    Emotionally Abused: No    Physically Abused: No    Sexually Abused: No    FAMILY HISTORY: Family History  Problem Relation Age of Onset   Aneurysm Mother    Heart disease Mother    Diabetes Mother    Hypertension Mother    Colon cancer Father        colon   Diabetes Sister    Kidney failure Sister    Heart failure Sister    Hodgkin's lymphoma Sister     Diabetes Brother    Heart disease Brother    Congestive Heart Failure Brother    Heart disease Brother    Diabetes Brother    Pancreatitis Brother    Heart failure Brother    Diabetes Maternal Grandmother    Hypertension Maternal Grandmother    Congestive Heart Failure Maternal Grandmother    Heart disease Paternal Grandmother    Hypertension Paternal Grandmother    Stroke Paternal Grandfather    Ovarian cancer Maternal Aunt        2021   Colon cancer Maternal Aunt    Esophageal cancer Paternal Aunt    Hodgkin's lymphoma Niece    Diabetes Daughter    Other Daughter        pre diabetic   Kidney failure Nephew    Diabetes Nephew    Breast cancer Cousin    Colon polyps Neg Hx  Stomach cancer Neg Hx     ALLERGIES:  is allergic to iodine, ivp dye [iodinated contrast media], and shellfish allergy.  MEDICATIONS:  Current Outpatient Medications  Medication Sig Dispense Refill   atorvastatin  (LIPITOR) 20 MG tablet Take 1 tablet (20 mg total) by mouth daily. 90 tablet 3   cyclobenzaprine  (FLEXERIL ) 10 MG tablet Take 1 tablet (10 mg total) by mouth 3 (three) times daily as needed for muscle spasms. (Patient not taking: Reported on 07/24/2024) 30 tablet 0   empagliflozin  (JARDIANCE ) 25 MG TABS tablet Take 1 tablet (25 mg total) by mouth daily. 90 tablet 3   fluconazole  (DIFLUCAN ) 150 MG tablet Take 1 tablet (150 mg total) by mouth daily. If symptoms persist take second dose of Diflucan  150 mg on day 3 to treat vaginal Candida. 2 tablet 0   glipiZIDE  (GLUCOTROL ) 5 MG tablet Take 1 tablet (5 mg total) by mouth 2 (two) times daily before a meal. (Patient not taking: Reported on 08/10/2024) 60 tablet 3   glucose blood (TRUE METRIX BLOOD GLUCOSE TEST) test strip Use as instructed. Check blood glucose level by fingerstick twice per day.  E11.65 (Patient not taking: Reported on 08/10/2024) 100 each 12   ibuprofen  (ADVIL ) 200 MG tablet Take 200 mg by mouth every 6 (six) hours as needed for moderate  pain (pain score 4-6).     ibuprofen  (ADVIL ) 600 MG tablet Take 1 tablet (600 mg total) by mouth every 6 (six) hours as needed. (Patient not taking: Reported on 07/24/2024) 30 tablet 0   lidocaine  (LIDODERM ) 5 % Place 1 patch onto the skin daily. Remove & Discard patch within 12 hours or as directed by MD (Patient not taking: Reported on 07/24/2024) 14 patch 0   lisinopril  (ZESTRIL ) 20 MG tablet Take 1 tablet (20 mg total) by mouth daily. 90 tablet 3   omeprazole  (PRILOSEC) 20 MG capsule Take 1 capsule (20 mg total) by mouth 2 (two) times daily before a meal. 180 capsule 1   ondansetron  (ZOFRAN -ODT) 4 MG disintegrating tablet Dissolve 1 tablet (4 mg total) by mouth every 8 (eight) hours as needed for nausea or vomiting. (Patient not taking: Reported on 07/24/2024) 30 tablet 1   traMADol  (ULTRAM ) 50 MG tablet Take 1 tablet (50 mg total) by mouth every 6 (six) hours as needed for moderate pain (pain score 4-6) or severe pain (pain score 7-10). (Patient not taking: Reported on 08/10/2024) 25 tablet 0   TRUEplus Lancets 28G MISC Use as instructed. Check blood glucose level by fingerstick twice per day. (Patient not taking: Reported on 08/10/2024) 100 each 3   No current facility-administered medications for this visit.    REVIEW OF SYSTEMS:   Constitutional: Denies fevers, chills or abnormal night sweats All other systems were reviewed with the patient and are negative.  PHYSICAL EXAMINATION: ECOG PERFORMANCE STATUS: 1 - Symptomatic but completely ambulatory  Vitals:   08/13/24 1436  BP: 121/67  Pulse: 88  Resp: 16  Temp: 99.4 F (37.4 C)  SpO2: 98%   Filed Weights   08/13/24 1436  Weight: 246 lb 3.2 oz (111.7 kg)    GENERAL:alert, no distress and comfortable    LABORATORY DATA:  I have reviewed the data as listed Lab Results  Component Value Date   WBC 6.5 07/25/2024   HGB 14.0 07/25/2024   HCT 44.2 07/25/2024   MCV 92.3 07/25/2024   PLT 220 07/25/2024   Lab Results  Component  Value Date   NA 145 07/25/2024  K 3.7 07/25/2024   CL 111 07/25/2024   CO2 27 07/25/2024    RADIOGRAPHIC STUDIES:  ASSESSMENT AND PLAN:  Ductal carcinoma in situ (DCIS) of right breast 07/30/2024: Screening mammogram detected right breast calcifications spanning 2.5 cm, biopsy: Focal epithelial atypia with ADH?  DCIS; right lumpectomy: High-grade DCIS with necrosis and calcifications 5 mm, margins negative ER 100%, PR 80%  Pathology review: I discussed with the patient the difference between DCIS and invasive breast cancer. It is considered a precancerous lesion. DCIS is classified as a 0. It is generally detected through mammograms as calcifications. We discussed the significance of grades and its impact on prognosis. We also discussed the importance of ER and PR receptors and their implications to adjuvant treatment options. Prognosis of DCIS dependence on grade, comedo necrosis. It is anticipated that if not treated, 20-30% of DCIS can develop into invasive breast cancer.  Recommendation: 1. adjuvant radiation therapy 2. Followed by antiestrogen therapy with tamoxifen 5 years versus anastrozole.  (Patient had recent polypectomy 2 years ago) it is for this reason we may consider anastrozole instead of tamoxifen.  Tamoxifen counseling: We discussed the risks and benefits of tamoxifen. These include but not limited to insomnia, hot flashes, mood changes, vaginal dryness, and weight gain. Although rare, serious side effects including endometrial cancer, risk of blood clots were also discussed. We strongly believe that the benefits far outweigh the risks. Patient understands these risks and consented to starting treatment. Planned treatment duration is 5 years.  Return to clinic after radiation is completed  Assessment and Plan Assessment & Plan Ductal carcinoma in situ (DCIS) of right breast, stage 0, ER/PR positive, post-surgical management DCIS of the right breast, stage 0, ER/PR  positive. Lesion was 0.5 cm, grade 2, confined within the duct. Post-surgery recurrence risk is 12%, reduced to 6% with radiation and 1-2% with antiestrogen therapy. Tamoxifen considered for risk reduction, but side effects include hot flashes, mood swings, blood clots, and uterine spotting. Anastrozole considered due to history of uterine polyps and fibroids. - Complete radiation therapy as planned. - Consider starting anastrozole after radiation therapy due to history of uterine polyps and fibroids. - Schedule a survivorship appointment with a lifestyle medicine expert after a couple of months on anastrozole.  History of postmenopausal bleeding with uterine polyps and fibroids Previous treatment with Megestrol  for postmenopausal bleeding. Occasional spotting noted, possibly related to constipation. Concerns about tamoxifen causing polyps, hence considering anastrozole as an alternative. - Consult with gynecologist to evaluate the suitability of tamoxifen versus anastrozole. - Monitor for any uterine spotting and consult gynecologist if it occurs.     All questions were answered. The patient knows to call the clinic with any problems, questions or concerns.    Viinay K Yenifer Saccente, MD 08/13/24

## 2024-08-18 ENCOUNTER — Encounter: Payer: Self-pay | Admitting: *Deleted

## 2024-08-18 DIAGNOSIS — D0511 Intraductal carcinoma in situ of right breast: Secondary | ICD-10-CM

## 2024-08-19 ENCOUNTER — Encounter: Payer: Self-pay | Admitting: Neurology

## 2024-08-19 ENCOUNTER — Ambulatory Visit (INDEPENDENT_AMBULATORY_CARE_PROVIDER_SITE_OTHER): Payer: Self-pay | Admitting: Neurology

## 2024-08-19 VITALS — BP 136/65 | HR 72 | Ht 63.0 in | Wt 243.0 lb

## 2024-08-19 DIAGNOSIS — R351 Nocturia: Secondary | ICD-10-CM

## 2024-08-19 DIAGNOSIS — R0681 Apnea, not elsewhere classified: Secondary | ICD-10-CM

## 2024-08-19 DIAGNOSIS — G4719 Other hypersomnia: Secondary | ICD-10-CM | POA: Diagnosis not present

## 2024-08-19 DIAGNOSIS — R519 Headache, unspecified: Secondary | ICD-10-CM

## 2024-08-19 DIAGNOSIS — D0511 Intraductal carcinoma in situ of right breast: Secondary | ICD-10-CM

## 2024-08-19 DIAGNOSIS — Z9189 Other specified personal risk factors, not elsewhere classified: Secondary | ICD-10-CM

## 2024-08-19 NOTE — Patient Instructions (Signed)

## 2024-08-19 NOTE — Progress Notes (Signed)
 Subjective:    Patient ID: Anna Mann is a 63 y.o. female.  HPI    True Mar, MD, PhD [facility] [address] [address] [place], [place] [address]  Dear Dr. Purcell,   I saw your patient, Anna Mann, upon your kind request in my sleep clinic today for initial consultation of her sleep disorder, in particular, concern for underlying obstructive sleep apnea.  The patient is unaccompanied today.  As you know, [name] is a 63 year old female with an underlying medical history of breast cancer (DCIS) with status post lumpectomy, AUB, allergies, diabetes, hypertension, hyperlipidemia, reflux disease, vertigo, and severe obesity with a BMI of over 40, who reports snoring and excessive daytime somnolence as well as witnessed apneas per daughter's report.  She does not wake up rested, symptoms have been ongoing for years but she has not been evaluated until now.  She was diagnosed with breast cancer recently and had lumpectomy on the right side in August 2025, about to start radiation treatment later this month.  Her Epworth sleepiness score is 20 out of 24, fatigue severity score is 50 out of 63.  She is not aware of any family history of sleep apnea.  She is divorced for many years.  She has reduced her caffeine intake but previously had quite a bit of caffeine in the form of coffee and soda.  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  She has nocturia about once per average night and has had occasional morning headaches.  She does not have a very set sleep schedule, currently in bed somewhere between midnight and 2 AM and rise time between 6 and 10 AM, she does take her son to work in the mornings and has to get up early for this.  She has not fallen asleep at the wheel.  She is a non-smoker and does not drink any alcohol.  I reviewed your office note from 07/22/2024.  Her Past Medical History  Is Significant For: Past Medical History:  Diagnosis Date   Abnormal Pap smear    colposcopy   Abnormal uterine bleeding (AUB)    Allergy    Diabetes mellitus without complication (HCC)    GERD (gastroesophageal reflux disease)    Heart murmur    Hyperlipidemia    Hypertension    Vertigo    hx    Her Past Surgical History Is Significant For: Past Surgical History:  Procedure Laterality Date   BREAST BIOPSY Right 07/10/2024   MM RT BREAST BX W LOC DEV 1ST LESION IMAGE BX SPEC STEREO GUIDE 07/10/2024 [facility]   BREAST BIOPSY  07/29/2024   MM RT RADIOACTIVE SEED LOC MAMMO GUIDE 07/29/2024 [facility]   COLONOSCOPY  2023   COLPOSCOPY     DILATATION & CURETTAGE/HYSTEROSCOPY WITH MYOSURE N/A 05/25/2021   Procedure: DILATATION & CURETTAGE/HYSTEROSCOPY WITH MYOSURE;  Surgeon: Storm Setter, DO;  Location: [facility];  Service: Gynecology;  Laterality: N/A;   DILATION AND CURETTAGE OF UTERUS     KNEE ARTHROSCOPY W/ ACL RECONSTRUCTION Left    LAPAROSCOPY N/A 05/25/2021   Procedure: LAPAROSCOPY DIAGNOSTIC,;  Surgeon: Storm Setter, DO;  Location: [facility];  Service: Gynecology;  Laterality: N/A;   TUBAL LIGATION     UPPER GI ENDOSCOPY  2023    Her Family History Is Significant For: Family History  Problem Relation Age of Onset  Aneurysm Mother    Heart disease Mother    Diabetes Mother    Hypertension Mother    Colon cancer Father        colon   Diabetes Sister    Kidney failure Sister    Heart failure Sister    Hodgkin's lymphoma Sister    Diabetes Brother    Heart disease Brother    Congestive Heart Failure Brother    Heart disease Brother    Diabetes Brother    Pancreatitis Brother    Heart failure Brother    Ovarian cancer Maternal Aunt        2021   Colon cancer Maternal Aunt    Esophageal cancer Paternal Aunt    Diabetes Maternal Grandmother    Hypertension Maternal Grandmother    Congestive Heart Failure  Maternal Grandmother    Heart disease Paternal Grandmother    Hypertension Paternal Grandmother    Stroke Paternal Grandfather    Diabetes Daughter    Other Daughter        pre diabetic   Breast cancer Cousin    Kidney failure Nephew    Diabetes Nephew    Hodgkin's lymphoma Niece    Colon polyps Neg Hx    Stomach cancer Neg Hx    Sleep apnea Neg Hx     Her Social History Is Significant For: Social History   Socioeconomic History   Marital status: Single    Spouse name: Not on file   Number of children: 3   Years of education: Not on file   Highest education level: Associate degree: occupational, Scientist, product/process development, or vocational program  Occupational History   Occupation: Psychologist, sport and exercise rep  Tobacco Use   Smoking status: Never    Passive exposure: Never   Smokeless tobacco: Never  Vaping Use   Vaping status: Never Used  Substance and Sexual Activity   Alcohol use: No   Drug use: No   Sexual activity: Not Currently    Birth control/protection: Post-menopausal  Other Topics Concern   Not on file  Social History Narrative   Pt doesn't work    Pt lives alone    Social Drivers of Health   Financial Resource Strain: Low Risk  (06/02/2024)   Overall Financial Resource Strain (CARDIA)    Difficulty of Paying Living Expenses: Not hard at all  Food Insecurity: No Food Insecurity (08/13/2024)   Hunger Vital Sign    Worried About Running Out of Food in the Last Year: Never true    Ran Out of Food in the Last Year: Never true  Transportation Needs: No Transportation Needs (08/13/2024)   PRAPARE - Administrator, Civil Service (Medical): No    Lack of Transportation (Non-Medical): No  Physical Activity: Unknown (06/02/2024)   Exercise Vital Sign    Days of Exercise per Week: Patient declined    Minutes of Exercise per Session: Not on file  Recent Concern: Physical Activity - Insufficiently Active (03/12/2024)   Exercise Vital Sign    Days of Exercise per Week: 1 day    Minutes  of Exercise per Session: 10 min  Stress: Stress Concern Present (06/02/2024)   [employer] of Occupational Health - Occupational Stress Questionnaire    Feeling of Stress: To some extent  Social Connections: Moderately Integrated (06/02/2024)   Social Connection and Isolation Panel    Frequency of Communication with Friends and Family: More than three times a week    Frequency of Social Gatherings with Friends  and Family: More than three times a week    Attends Religious Services: More than 4 times per year    Active Member of Clubs or Organizations: Yes    Attends Engineer, structural: More than 4 times per year    Marital Status: Divorced    Her Allergies Are:  Allergies  Allergen Reactions   Iodine Cough, Hives, Shortness Of Breath, Swelling and Anaphylaxis   Ivp Dye [Iodinated Contrast Media] Anaphylaxis, Hives, Itching and Swelling   Shellfish Allergy Anaphylaxis, Hives and Itching  :   Her Current Medications Are:  Outpatient Encounter Medications as of 08/19/2024  Medication Sig   atorvastatin  (LIPITOR) 20 MG tablet Take 1 tablet (20 mg total) by mouth daily.   empagliflozin  (JARDIANCE ) 25 MG TABS tablet Take 1 tablet (25 mg total) by mouth daily.   glucose blood (TRUE METRIX BLOOD GLUCOSE TEST) test strip Use as instructed. Check blood glucose level by fingerstick twice per day.  E11.65   ibuprofen  (ADVIL ) 200 MG tablet Take 200 mg by mouth every 6 (six) hours as needed for moderate pain (pain score 4-6). (Patient taking differently: Take 200 mg by mouth as needed for moderate pain (pain score 4-6).)   lisinopril  (ZESTRIL ) 20 MG tablet Take 1 tablet (20 mg total) by mouth daily.   TRUEplus Lancets 28G MISC Use as instructed. Check blood glucose level by fingerstick twice per day.   cyclobenzaprine  (FLEXERIL ) 10 MG tablet Take 1 tablet (10 mg total) by mouth 3 (three) times daily as needed for muscle spasms. (Patient not taking: Reported on 07/24/2024)   fluconazole   (DIFLUCAN ) 150 MG tablet Take 1 tablet (150 mg total) by mouth daily. If symptoms persist take second dose of Diflucan  150 mg on day 3 to treat vaginal Candida.   glipiZIDE  (GLUCOTROL ) 5 MG tablet Take 1 tablet (5 mg total) by mouth 2 (two) times daily before a meal. (Patient not taking: Reported on 08/10/2024)   ibuprofen  (ADVIL ) 600 MG tablet Take 1 tablet (600 mg total) by mouth every 6 (six) hours as needed. (Patient not taking: Reported on 07/24/2024)   lidocaine  (LIDODERM ) 5 % Place 1 patch onto the skin daily. Remove & Discard patch within 12 hours or as directed by MD (Patient not taking: Reported on 07/24/2024)   omeprazole  (PRILOSEC) 20 MG capsule Take 1 capsule (20 mg total) by mouth 2 (two) times daily before a meal.   ondansetron  (ZOFRAN -ODT) 4 MG disintegrating tablet Dissolve 1 tablet (4 mg total) by mouth every 8 (eight) hours as needed for nausea or vomiting. (Patient not taking: Reported on 07/24/2024)   traMADol  (ULTRAM ) 50 MG tablet Take 1 tablet (50 mg total) by mouth every 6 (six) hours as needed for moderate pain (pain score 4-6) or severe pain (pain score 7-10). (Patient not taking: Reported on 08/10/2024)   No facility-administered encounter medications on file as of 08/19/2024.  :   Review of Systems:  Out of a complete 14 point review of systems, all are reviewed and negative with the exception of these symptoms as listed below:   Review of Systems  Neurological:        Pt here for sleep consult Pt has fatigue,headaches,hypertension Pt denies snoring,sleep study ,cpap machine    ESS:20 FSS:    Objective:  Neurological Exam  Physical Exam Physical Examination:   Vitals:   08/19/24 1014  BP: 136/65  Pulse: 72    General Examination: The patient is a very pleasant 63 y.o. female in  no acute distress. She appears well-developed and well-nourished and well groomed.   HEENT: Normocephalic, atraumatic, pupils are equal, round and reactive to light, extraocular  tracking is good without limitation to gaze excursion or nystagmus noted. Hearing is grossly intact. Face is symmetric with normal facial animation. Speech is clear with no dysarthria noted. There is no hypophonia. There is no lip, neck/head, jaw or voice tremor. Neck is supple with full range of passive and active motion. There are no carotid bruits on auscultation.  She is wearing a protective face mask and removes the mask for airway examination.  Oropharynx exam reveals: mild mouth dryness, adequate dental hygiene and moderate airway crowding, due to small airway entry and tonsillar size of about 1+, slightly wider uvula.  Mallampati class II.  Neck circumference 17 1/8 inches, mild overbite noted.  Tongue protrudes centrally and palate elevates symmetrically.  Chest: Clear to auscultation without wheezing, rhonchi or crackles noted.  Heart: S1+S2+0, regular and normal without murmurs, rubs or gallops noted.   Abdomen: Soft, non-tender and non-distended.  Extremities: There is no pitting edema in the distal lower extremities bilaterally.   Skin: Warm and dry without trophic changes noted.   Musculoskeletal: exam reveals no obvious joint deformities.   Neurologically:  Mental status: The patient is awake, alert and oriented in all 4 spheres. Her immediate and remote memory, attention, language skills and fund of knowledge are appropriate. There is no evidence of aphasia, agnosia, apraxia or anomia. Speech is clear with normal prosody and enunciation. Thought process is linear. Mood is normal and affect is normal.  Cranial nerves II - XII are as described above under HEENT exam.  Motor exam: Normal bulk, strength and tone is noted. There is no obvious action or resting tremor.  Fine motor skills and coordination: grossly intact.  Cerebellar testing: No dysmetria or intention tremor. There is no truncal or gait ataxia.  Sensory exam: intact to light touch in the upper and lower extremities.   Gait, station and balance: She stands easily. No veering to one side is noted. No leaning to one side is noted. Posture is age-appropriate and stance is narrow based. Gait shows normal stride length and normal pace. No problems turning are noted.   Assessment and Plan:   In summary, Eyva Flitton is a very pleasant 63 y.o.-year old female with an underlying medical history of breast cancer (DCIS) with status post lumpectomy, AUB, allergies, diabetes, hypertension, hyperlipidemia, reflux disease, vertigo, and severe obesity with a BMI of over 40, whose history and physical exam are concerning for sleep disordered breathing, particularly obstructive sleep apnea (OSA). A laboratory attended sleep study is typically considered gold standard for evaluation of sleep disordered breathing.   I had a long chat with the patient about my findings and the diagnosis of sleep apnea, particularly OSA, its prognosis and treatment options. We talked about medical/conservative treatments, surgical interventions and non-pharmacological approaches for symptom control. I explained, in particular, the risks and ramifications of untreated moderate to severe OSA, especially with respect to developing cardiovascular disease down the road, including congestive heart failure (CHF), difficult to treat hypertension, cardiac arrhythmias (particularly A-fib), neurovascular complications including TIA, stroke and dementia. Even type 2 diabetes has, in part, been linked to untreated OSA. Symptoms of untreated OSA may include (but may not be limited to) daytime sleepiness, nocturia (i.e. frequent nighttime urination), memory problems, mood irritability and suboptimally controlled or worsening mood disorder such as depression and/or anxiety, lack of energy, lack of motivation,  physical discomfort, as well as recurrent headaches, especially morning or nocturnal headaches. We talked about the importance of maintaining a healthy lifestyle and  striving for healthy weight. In addition, we talked about the importance of striving for and maintaining good sleep hygiene. I recommended a sleep study at this time. I outlined the differences between a laboratory attended sleep study which is considered more comprehensive and accurate over the option of a home sleep test (HST); the latter may lead to underestimation of sleep disordered breathing in some instances and does not help with diagnosing upper airway resistance syndrome and is not accurate enough to diagnose primary central sleep apnea typically. I outlined possible surgical and non-surgical treatment options of OSA, including the use of a positive airway pressure (PAP) device (i.e. CPAP, AutoPAP/APAP or BiPAP in certain circumstances), a custom-made dental device (aka oral appliance, which would require a referral to a specialist dentist or orthodontist typically, and is generally speaking not considered for patients with full dentures or edentulous state), upper airway surgical options, such as traditional UPPP (which is not considered a first-line treatment) or the Inspire device (hypoglossal nerve stimulator, which would involve a referral for consultation with an ENT surgeon, after careful selection, following inclusion criteria - also not first-line treatment). I explained the PAP treatment option to the patient in detail, as this is generally considered first-line treatment.  The patient indicated that she would be willing to try PAP therapy, if the need arises. I explained the importance of being compliant with PAP treatment, not only for insurance purposes but primarily to improve patient's symptoms symptoms, and for the patient's long term health benefit, including to reduce Her cardiovascular risks longer-term.    We will pick up our discussion about the next steps and treatment options after testing.  We will keep her posted as to the test results by phone call and/or MyChart messaging  where possible.  We will plan to follow-up in sleep clinic accordingly as well.  I answered all her questions today and the patient was in agreement.   I encouraged her to call with any interim questions, concerns, problems or updates or email us  through MyChart.  Generally speaking, sleep test authorizations may take up to 2 weeks, sometimes less, sometimes longer, the patient is encouraged to get in touch with us  if they do not hear back from the sleep lab staff directly within the next 2 weeks.  Thank you very much for allowing me to participate in the care of this nice patient. If I can be of any further assistance to you please do not hesitate to call me at [phone].  Sincerely,   True Mar, MD, PhD

## 2024-08-20 ENCOUNTER — Inpatient Hospital Stay: Admitting: Licensed Clinical Social Worker

## 2024-08-20 NOTE — Progress Notes (Signed)
 [facility] Clinical Social Work  Initial Assessment   Anna Mann is a 63 y.o. year old female contacted by phone. Clinical Social Work was referred by new patient protocol for assessment of psychosocial needs.   SDOH (Social Determinants of Health) assessments performed: Yes SDOH Interventions    Flowsheet Row Clinical Support from 08/20/2024 in [facility] Dept Of [facility]  SDOH Interventions   Food Insecurity Interventions Intervention Not Indicated  Housing Interventions Other (Comment)  [cancer foundations/grants]  Transportation Interventions Payor Benefit  Utilities Interventions Intervention Not Indicated    SDOH Screenings   Food Insecurity: No Food Insecurity (08/20/2024)  Housing: High Risk (08/20/2024)  Transportation Needs: No Transportation Needs (08/20/2024)  Utilities: Not At Risk (08/20/2024)  Depression (PHQ2-9): Low Risk  (08/13/2024)  Financial Resource Strain: Low Risk  (06/02/2024)  Physical Activity: Unknown (06/02/2024)  Recent Concern: Physical Activity - Insufficiently Active (03/12/2024)  Social Connections: Moderately Integrated (06/02/2024)  Stress: Stress Concern Present (06/02/2024)  Tobacco Use: Low Risk  (08/19/2024)    PHQ 2/9:    08/13/2024    1:23 PM 07/22/2024    9:23 AM 06/03/2024    9:52 AM  Depression screen PHQ 2/9  Decreased Interest 0 0 0  Down, Depressed, Hopeless 0 0 0  PHQ - 2 Score 0 0 0     Distress Screen completed: No     No data to display            Family/Social Information:  Housing Arrangement: patient lives alone Family members/support persons in your life? Family Transportation concerns: yes, but has access to Medicaid transportation  Employment: Unemployed - was on unemployment which recently ended. Plans to try to work again once she is through treatment  Income source: No income Financial concerns: Yes, current concerns Type of concern: Rent/ mortgage Food access concerns: no,  has SNAP benefits Advanced directives: No Services Currently in place:  [employer] Medicaid; SNAP; LIEAP  Coping/ Adjustment to diagnosis: Patient understands treatment plan and what happens next? yes, she has had surgery and is getting ready to start radiation. Feels more comfortable with that after meeting with Dr. Izell Concerns about diagnosis and/or treatment: I'm not especially worried about anything Patient reported stressors: Housing and Finances Current coping skills/ strengths: Ability for insight , Capable of independent living , Motivation for treatment/growth , and Other: ability to access community resources    SUMMARY: Current SDOH Barriers:  Financial constraints related to unemployment during treatment  Clinical Social Work Clinical Goal(s):  Patient will work with CSW and Physicist, medical support team to address needs related to financial strain  Interventions: Discussed common feeling and emotions when being diagnosed with cancer, and the importance of support during treatment Informed patient of the support team roles and support services at [facility] Provided CSW contact information and encouraged patient to call with any questions or concerns Provided patient with information about Licensed conveyancer. Provided list of documents required for applications  Provided contact information for transportation through [employer] Medicaid   Follow Up Plan: Patient will gather documents and return them to this CSW and Anna Mann for grant applications Patient verbalizes understanding of plan: Yes    Edd Reppert E Elijah Michaelis, LCSW Clinical Social Worker [facility]  Patient is participating in a Managed Medicaid Plan:  Yes

## 2024-08-21 ENCOUNTER — Telehealth: Payer: Self-pay | Admitting: Neurology

## 2024-08-21 NOTE — Telephone Encounter (Signed)
 [employer]PSG MCD [employer] pending

## 2024-08-22 ENCOUNTER — Encounter: Payer: Self-pay | Admitting: Radiation Oncology

## 2024-08-22 ENCOUNTER — Encounter: Payer: Self-pay | Admitting: Licensed Clinical Social Worker

## 2024-08-22 ENCOUNTER — Ambulatory Visit
Admission: RE | Admit: 2024-08-22 | Discharge: 2024-08-22 | Disposition: A | Discharge status: Home / Self Care | Source: Ambulatory Visit | Attending: Radiation Oncology | Admitting: Radiation Oncology

## 2024-08-22 DIAGNOSIS — Z510 Encounter for antineoplastic radiation therapy: Secondary | ICD-10-CM | POA: Insufficient documentation

## 2024-08-22 DIAGNOSIS — D0511 Intraductal carcinoma in situ of right breast: Secondary | ICD-10-CM | POA: Insufficient documentation

## 2024-08-22 NOTE — Progress Notes (Signed)
 Called patient to introduce myself as Dance movement psychotherapist and to discuss one-time $1000 [employer] to assist with personal expenses while going through treatment. Advised what is needed to apply and she will be providing today while at [facility]. She will also need to complete grant paperwork and be given a copy of approval letter and expense sheet to review. Advised what is needed for expense she would like to submit and how to submit. She will receive my card with my contact information on it to provide information and for any additional financial questions or concerns.

## 2024-08-22 NOTE — Progress Notes (Signed)
 [facility] CSW Progress Note  Visual merchandiser spoke with pt via phone and e-mail to follow-up on [employer] application. Pt provided lease and personal statement.  Still awaiting document including where to send payment if she is approved.  Pt will follow up with the leasing office for this on Monday and then send it to this CSW.  Pt also provided documents for [employer] which were sent to [place].        Anna Bade E Cadin Luka, LCSW Clinical Social Worker [facility]    Patient is participating in a Managed Medicaid Plan:  Yes

## 2024-08-22 NOTE — Progress Notes (Signed)
 Patient returned call back to discuss details regarding what is needed for expense she wishes to submit. Provided information to her.  She has my card for any additional financial questions or concerns.

## 2024-08-25 ENCOUNTER — Encounter: Payer: Self-pay | Admitting: Emergency Medicine

## 2024-08-25 DIAGNOSIS — Z510 Encounter for antineoplastic radiation therapy: Secondary | ICD-10-CM | POA: Diagnosis not present

## 2024-08-26 ENCOUNTER — Other Ambulatory Visit: Payer: Self-pay | Admitting: Radiology

## 2024-08-26 DIAGNOSIS — Z0001 Encounter for general adult medical examination with abnormal findings: Secondary | ICD-10-CM

## 2024-08-27 ENCOUNTER — Telehealth: Payer: Self-pay | Admitting: Adult Health

## 2024-08-27 NOTE — Telephone Encounter (Signed)
 Called and spoke with patient regarding appt. She is aware

## 2024-08-27 NOTE — Telephone Encounter (Signed)
 I spoke wit the patient.  NPSG MCD [employer] no auth req via fax.   Patient is scheduled at [facility] for 09/14/24 at 9 pm.  Mailed packet and sent mychart.

## 2024-08-28 ENCOUNTER — Inpatient Hospital Stay: Admitting: Licensed Clinical Social Worker

## 2024-08-28 NOTE — Progress Notes (Signed)
 [facility] CSW Progress Note  Visual merchandiser spoke with patient and received final document for [employer] application. Submitted application today and discussed process for communication from the foundation   Anna Mann E Ellwood Steidle, LCSW Clinical Social Worker [facility]    Patient is participating in a Managed Medicaid Plan:  Yes

## 2024-09-01 ENCOUNTER — Other Ambulatory Visit: Payer: Self-pay

## 2024-09-01 ENCOUNTER — Encounter: Payer: Self-pay | Admitting: Licensed Clinical Social Worker

## 2024-09-01 ENCOUNTER — Ambulatory Visit
Admission: RE | Admit: 2024-09-01 | Discharge: 2024-09-01 | Disposition: A | Discharge status: Home / Self Care | Source: Ambulatory Visit | Attending: Radiation Oncology | Admitting: Radiation Oncology

## 2024-09-01 DIAGNOSIS — Z510 Encounter for antineoplastic radiation therapy: Secondary | ICD-10-CM | POA: Diagnosis not present

## 2024-09-01 LAB — RAD ONC ARIA SESSION SUMMARY
Course Elapsed Days: 0
Plan Fractions Treated to Date: 1
Plan Prescribed Dose Per Fraction: 2.67 Gy
Plan Total Fractions Prescribed: 15
Plan Total Prescribed Dose: 40.05 Gy
Reference Point Dosage Given to Date: 2.67 Gy
Reference Point Session Dosage Given: 2.67 Gy
Session Number: 1

## 2024-09-01 NOTE — Progress Notes (Signed)
 [facility] Clinical Social Work  Diplomatic Services operational officer is out of funding and unable to assist pt this month. CSW will submit again when it reopens in October. Notified pt via e-mail.   Tyee Vandevoorde E Dejanique Ruehl, LCSW

## 2024-09-02 ENCOUNTER — Ambulatory Visit
Admission: RE | Admit: 2024-09-02 | Discharge: 2024-09-02 | Disposition: A | Discharge status: Home / Self Care | Source: Ambulatory Visit | Attending: Radiation Oncology | Admitting: Radiation Oncology

## 2024-09-02 ENCOUNTER — Other Ambulatory Visit: Payer: Self-pay

## 2024-09-02 DIAGNOSIS — Z510 Encounter for antineoplastic radiation therapy: Secondary | ICD-10-CM | POA: Diagnosis not present

## 2024-09-02 LAB — RAD ONC ARIA SESSION SUMMARY
Course Elapsed Days: 1
Plan Fractions Treated to Date: 2
Plan Prescribed Dose Per Fraction: 2.67 Gy
Plan Total Fractions Prescribed: 15
Plan Total Prescribed Dose: 40.05 Gy
Reference Point Dosage Given to Date: 5.34 Gy
Reference Point Session Dosage Given: 2.67 Gy
Session Number: 2

## 2024-09-03 ENCOUNTER — Other Ambulatory Visit: Payer: Self-pay

## 2024-09-03 ENCOUNTER — Ambulatory Visit
Admission: RE | Admit: 2024-09-03 | Discharge: 2024-09-03 | Disposition: A | Discharge status: Home / Self Care | Source: Ambulatory Visit | Attending: Radiation Oncology | Admitting: Radiation Oncology

## 2024-09-03 DIAGNOSIS — Z510 Encounter for antineoplastic radiation therapy: Secondary | ICD-10-CM | POA: Diagnosis not present

## 2024-09-03 LAB — RAD ONC ARIA SESSION SUMMARY
Course Elapsed Days: 2
Plan Fractions Treated to Date: 3
Plan Prescribed Dose Per Fraction: 2.67 Gy
Plan Total Fractions Prescribed: 15
Plan Total Prescribed Dose: 40.05 Gy
Reference Point Dosage Given to Date: 8.01 Gy
Reference Point Session Dosage Given: 2.67 Gy
Session Number: 3

## 2024-09-04 ENCOUNTER — Other Ambulatory Visit: Payer: Self-pay

## 2024-09-04 ENCOUNTER — Ambulatory Visit
Admission: RE | Admit: 2024-09-04 | Discharge: 2024-09-04 | Disposition: A | Discharge status: Home / Self Care | Source: Ambulatory Visit | Attending: Radiation Oncology | Admitting: Radiation Oncology

## 2024-09-04 DIAGNOSIS — Z510 Encounter for antineoplastic radiation therapy: Secondary | ICD-10-CM | POA: Diagnosis not present

## 2024-09-04 LAB — RAD ONC ARIA SESSION SUMMARY
Course Elapsed Days: 3
Plan Fractions Treated to Date: 4
Plan Prescribed Dose Per Fraction: 2.67 Gy
Plan Total Fractions Prescribed: 15
Plan Total Prescribed Dose: 40.05 Gy
Reference Point Dosage Given to Date: 10.68 Gy
Reference Point Session Dosage Given: 2.67 Gy
Session Number: 4

## 2024-09-05 ENCOUNTER — Other Ambulatory Visit: Payer: Self-pay

## 2024-09-05 ENCOUNTER — Ambulatory Visit
Admission: RE | Admit: 2024-09-05 | Discharge: 2024-09-05 | Disposition: A | Discharge status: Home / Self Care | Source: Ambulatory Visit | Attending: Radiation Oncology | Admitting: Radiation Oncology

## 2024-09-05 ENCOUNTER — Encounter: Payer: Self-pay | Admitting: Hematology and Oncology

## 2024-09-05 DIAGNOSIS — Z510 Encounter for antineoplastic radiation therapy: Secondary | ICD-10-CM | POA: Diagnosis not present

## 2024-09-05 LAB — RAD ONC ARIA SESSION SUMMARY
Course Elapsed Days: 4
Plan Fractions Treated to Date: 5
Plan Prescribed Dose Per Fraction: 2.67 Gy
Plan Total Fractions Prescribed: 15
Plan Total Prescribed Dose: 40.05 Gy
Reference Point Dosage Given to Date: 13.35 Gy
Reference Point Session Dosage Given: 2.67 Gy
Session Number: 5

## 2024-09-05 NOTE — Progress Notes (Signed)
 Patient called to ask questions regarding grant approval and gas cards. Confirmed she was approved when she signed the grant approval letter. Confirmed expense was submitted on 08/28/24 and it will take up to two weeks as stated on the expense sheet for merchant to receive the check. Confirmed she can get gas card today and how to request and receive.  She has my card for any additional financial questions or concerns.

## 2024-09-08 ENCOUNTER — Other Ambulatory Visit: Payer: Self-pay

## 2024-09-08 ENCOUNTER — Ambulatory Visit
Admission: RE | Admit: 2024-09-08 | Discharge: 2024-09-08 | Disposition: A | Source: Ambulatory Visit | Attending: Radiation Oncology

## 2024-09-08 ENCOUNTER — Ambulatory Visit
Admission: RE | Admit: 2024-09-08 | Discharge: 2024-09-08 | Disposition: A | Discharge status: Home / Self Care | Source: Ambulatory Visit | Attending: Radiation Oncology | Admitting: Radiation Oncology

## 2024-09-08 DIAGNOSIS — Z510 Encounter for antineoplastic radiation therapy: Secondary | ICD-10-CM | POA: Diagnosis not present

## 2024-09-08 LAB — RAD ONC ARIA SESSION SUMMARY
Course Elapsed Days: 7
Plan Fractions Treated to Date: 6
Plan Prescribed Dose Per Fraction: 2.67 Gy
Plan Total Fractions Prescribed: 15
Plan Total Prescribed Dose: 40.05 Gy
Reference Point Dosage Given to Date: 16.02 Gy
Reference Point Session Dosage Given: 2.67 Gy
Session Number: 6

## 2024-09-09 ENCOUNTER — Ambulatory Visit
Admission: RE | Admit: 2024-09-09 | Discharge: 2024-09-09 | Disposition: A | Discharge status: Home / Self Care | Source: Ambulatory Visit | Attending: Radiation Oncology | Admitting: Radiation Oncology

## 2024-09-09 ENCOUNTER — Other Ambulatory Visit: Payer: Self-pay

## 2024-09-09 DIAGNOSIS — Z510 Encounter for antineoplastic radiation therapy: Secondary | ICD-10-CM | POA: Diagnosis not present

## 2024-09-09 LAB — RAD ONC ARIA SESSION SUMMARY
Course Elapsed Days: 8
Plan Fractions Treated to Date: 7
Plan Prescribed Dose Per Fraction: 2.67 Gy
Plan Total Fractions Prescribed: 15
Plan Total Prescribed Dose: 40.05 Gy
Reference Point Dosage Given to Date: 18.69 Gy
Reference Point Session Dosage Given: 2.67 Gy
Session Number: 7

## 2024-09-10 ENCOUNTER — Other Ambulatory Visit: Payer: Self-pay

## 2024-09-10 ENCOUNTER — Ambulatory Visit
Admission: RE | Admit: 2024-09-10 | Discharge: 2024-09-10 | Disposition: A | Discharge status: Home / Self Care | Source: Ambulatory Visit | Attending: Radiation Oncology | Admitting: Radiation Oncology

## 2024-09-10 DIAGNOSIS — D0511 Intraductal carcinoma in situ of right breast: Secondary | ICD-10-CM | POA: Diagnosis present

## 2024-09-10 DIAGNOSIS — Z510 Encounter for antineoplastic radiation therapy: Secondary | ICD-10-CM | POA: Diagnosis present

## 2024-09-10 DIAGNOSIS — Z79811 Long term (current) use of aromatase inhibitors: Secondary | ICD-10-CM | POA: Diagnosis not present

## 2024-09-10 DIAGNOSIS — Z923 Personal history of irradiation: Secondary | ICD-10-CM | POA: Diagnosis not present

## 2024-09-10 LAB — RAD ONC ARIA SESSION SUMMARY
Course Elapsed Days: 9
Plan Fractions Treated to Date: 8
Plan Prescribed Dose Per Fraction: 2.67 Gy
Plan Total Fractions Prescribed: 15
Plan Total Prescribed Dose: 40.05 Gy
Reference Point Dosage Given to Date: 21.36 Gy
Reference Point Session Dosage Given: 2.67 Gy
Session Number: 8

## 2024-09-11 ENCOUNTER — Inpatient Hospital Stay: Admitting: Licensed Clinical Social Worker

## 2024-09-11 ENCOUNTER — Other Ambulatory Visit: Payer: Self-pay

## 2024-09-11 ENCOUNTER — Ambulatory Visit
Admission: RE | Admit: 2024-09-11 | Discharge: 2024-09-11 | Disposition: A | Source: Ambulatory Visit | Attending: Radiation Oncology

## 2024-09-11 DIAGNOSIS — D0511 Intraductal carcinoma in situ of right breast: Secondary | ICD-10-CM | POA: Insufficient documentation

## 2024-09-11 DIAGNOSIS — Z79811 Long term (current) use of aromatase inhibitors: Secondary | ICD-10-CM | POA: Insufficient documentation

## 2024-09-11 DIAGNOSIS — Z510 Encounter for antineoplastic radiation therapy: Secondary | ICD-10-CM | POA: Diagnosis not present

## 2024-09-11 DIAGNOSIS — Z923 Personal history of irradiation: Secondary | ICD-10-CM | POA: Insufficient documentation

## 2024-09-11 LAB — RAD ONC ARIA SESSION SUMMARY
Course Elapsed Days: 10
Plan Fractions Treated to Date: 9
Plan Prescribed Dose Per Fraction: 2.67 Gy
Plan Total Fractions Prescribed: 15
Plan Total Prescribed Dose: 40.05 Gy
Reference Point Dosage Given to Date: 24.03 Gy
Reference Point Session Dosage Given: 2.67 Gy
Session Number: 9

## 2024-09-11 NOTE — Progress Notes (Signed)
 [facility] Clinical Social Work  CSW spoke with patient about [employer] foundation which recently re-opened their application. Pt gave permission for CSW to apply on her behalf. Application completed today.    Tomas Schamp E Noni Stonesifer, LCSW

## 2024-09-12 ENCOUNTER — Inpatient Hospital Stay: Admitting: Licensed Clinical Social Worker

## 2024-09-12 ENCOUNTER — Other Ambulatory Visit: Payer: Self-pay

## 2024-09-12 ENCOUNTER — Ambulatory Visit
Admission: RE | Admit: 2024-09-12 | Discharge: 2024-09-12 | Disposition: A | Discharge status: Home / Self Care | Source: Ambulatory Visit | Attending: Radiation Oncology | Admitting: Radiation Oncology

## 2024-09-12 DIAGNOSIS — Z510 Encounter for antineoplastic radiation therapy: Secondary | ICD-10-CM | POA: Diagnosis not present

## 2024-09-12 LAB — RAD ONC ARIA SESSION SUMMARY
Course Elapsed Days: 11
Plan Fractions Treated to Date: 10
Plan Prescribed Dose Per Fraction: 2.67 Gy
Plan Total Fractions Prescribed: 15
Plan Total Prescribed Dose: 40.05 Gy
Reference Point Dosage Given to Date: 26.7 Gy
Reference Point Session Dosage Given: 2.67 Gy
Session Number: 10

## 2024-09-12 NOTE — Progress Notes (Signed)
 [facility] CSW Progress Note  Clinical Child psychotherapist contacted patient by phone to follow-up on need for community resources.    Interventions: Completed and submitted application for [employer]'s Pink Purse- notified pt of how she will be contacted if approved      Follow Up Plan:  Patient will contact CSW with any support or resource needs    Beryl Hornberger E Deangelo Berns, LCSW Clinical Social Worker [facility]    Patient is participating in a Managed Medicaid Plan:  Yes

## 2024-09-14 ENCOUNTER — Ambulatory Visit (INDEPENDENT_AMBULATORY_CARE_PROVIDER_SITE_OTHER): Admitting: Neurology

## 2024-09-14 DIAGNOSIS — D0511 Intraductal carcinoma in situ of right breast: Secondary | ICD-10-CM

## 2024-09-14 DIAGNOSIS — G4733 Obstructive sleep apnea (adult) (pediatric): Secondary | ICD-10-CM

## 2024-09-14 DIAGNOSIS — G4719 Other hypersomnia: Secondary | ICD-10-CM

## 2024-09-14 DIAGNOSIS — R519 Headache, unspecified: Secondary | ICD-10-CM

## 2024-09-14 DIAGNOSIS — R351 Nocturia: Secondary | ICD-10-CM

## 2024-09-14 DIAGNOSIS — G4720 Circadian rhythm sleep disorder, unspecified type: Secondary | ICD-10-CM

## 2024-09-14 DIAGNOSIS — Z9189 Other specified personal risk factors, not elsewhere classified: Secondary | ICD-10-CM

## 2024-09-14 DIAGNOSIS — R0681 Apnea, not elsewhere classified: Secondary | ICD-10-CM

## 2024-09-15 ENCOUNTER — Encounter: Payer: Self-pay | Admitting: Emergency Medicine

## 2024-09-15 ENCOUNTER — Ambulatory Visit
Admission: RE | Admit: 2024-09-15 | Discharge: 2024-09-15 | Disposition: A | Source: Ambulatory Visit | Attending: Radiation Oncology

## 2024-09-15 ENCOUNTER — Other Ambulatory Visit: Payer: Self-pay

## 2024-09-15 ENCOUNTER — Ambulatory Visit: Admitting: Emergency Medicine

## 2024-09-15 VITALS — BP 126/72 | HR 77 | Temp 98.7°F | Ht 63.0 in | Wt 244.0 lb

## 2024-09-15 DIAGNOSIS — N760 Acute vaginitis: Secondary | ICD-10-CM

## 2024-09-15 DIAGNOSIS — B9689 Other specified bacterial agents as the cause of diseases classified elsewhere: Secondary | ICD-10-CM

## 2024-09-15 DIAGNOSIS — K219 Gastro-esophageal reflux disease without esophagitis: Secondary | ICD-10-CM | POA: Diagnosis not present

## 2024-09-15 DIAGNOSIS — G8929 Other chronic pain: Secondary | ICD-10-CM

## 2024-09-15 DIAGNOSIS — M25562 Pain in left knee: Secondary | ICD-10-CM

## 2024-09-15 DIAGNOSIS — Z510 Encounter for antineoplastic radiation therapy: Secondary | ICD-10-CM | POA: Diagnosis not present

## 2024-09-15 LAB — RAD ONC ARIA SESSION SUMMARY
Course Elapsed Days: 14
Plan Fractions Treated to Date: 11
Plan Prescribed Dose Per Fraction: 2.67 Gy
Plan Total Fractions Prescribed: 15
Plan Total Prescribed Dose: 40.05 Gy
Reference Point Dosage Given to Date: 29.37 Gy
Reference Point Session Dosage Given: 2.67 Gy
Session Number: 11

## 2024-09-15 MED ORDER — METRONIDAZOLE 500 MG PO TABS: 500.0000 mg | ORAL_TABLET | Freq: Two times a day (BID) | ORAL | 0 refills | Status: DC

## 2024-09-15 MED ORDER — OMEPRAZOLE 20 MG PO CPDR: 20.0000 mg | DELAYED_RELEASE_CAPSULE | Freq: Two times a day (BID) | ORAL | 1 refills | Status: AC

## 2024-09-15 NOTE — Assessment & Plan Note (Signed)
 ACL repair 2003 Now constant pain for 2 weeks Pain management discussed Advised to take Tylenol  and or Advil  as needed Needs orthopedic evaluation Referral placed today

## 2024-09-15 NOTE — Progress Notes (Signed)
 Anna Mann 63 y.o.   Chief Complaint  Patient presents with   Vaginitis    Patient here for yeast infection. Patient states she is wanting to be swabbed for yeast, she says her appt with OBGYN is not until November and she can't wait that long. fluconazole  (DIFLUCAN ) 150 MG table did not work for her. Patient mentions having left knee pain that started last week, she is having constant pain 10/10 pain scale. She has took ibuprofen  last thurs To help and it did.     HISTORY OF PRESENT ILLNESS: This is a 63 y.o. female A1A complaining of vaginal foul-smelling discharge Also complaining of left knee pain started about 2 weeks ago History of ACL repair in 2003.  Needs Ortho referral  HPI   Prior to Admission medications   Medication Sig Start Date End Date Taking? Authorizing Provider  atorvastatin  (LIPITOR) 20 MG tablet Take 1 tablet (20 mg total) by mouth daily. 06/03/24  Yes Luster Hechler, Emil Schanz, MD  empagliflozin  (JARDIANCE ) 25 MG TABS tablet Take 1 tablet (25 mg total) by mouth daily. 06/03/24  Yes Jleigh Striplin, Emil Schanz, MD  ibuprofen  (ADVIL ) 600 MG tablet Take 1 tablet (600 mg total) by mouth every 6 (six) hours as needed. 04/14/23  Yes Ali, Amjad, PA-C  lisinopril  (ZESTRIL ) 20 MG tablet Take 1 tablet (20 mg total) by mouth daily. 06/03/24  Yes Samona Chihuahua, Emil Schanz, MD  metroNIDAZOLE  (FLAGYL ) 500 MG tablet Take 1 tablet (500 mg total) by mouth 2 (two) times daily for 7 days. 09/15/24 09/22/24 Yes Shunta Mclaurin, Emil Schanz, MD  TRUEplus Lancets 28G MISC Use as instructed. Check blood glucose level by fingerstick twice per day. 11/22/19  Yes Theotis Haze ORN, NP  cyclobenzaprine  (FLEXERIL ) 10 MG tablet Take 1 tablet (10 mg total) by mouth 3 (three) times daily as needed for muscle spasms. Patient not taking: Reported on 09/15/2024 04/19/23   Purcell Emil Schanz, MD  fluconazole  (DIFLUCAN ) 150 MG tablet Take 1 tablet (150 mg total) by mouth daily. If symptoms persist take second dose of  Diflucan  150 mg on day 3 to treat vaginal Candida. Patient not taking: Reported on 09/15/2024 08/10/24   Reddick, Johnathan B, NP  glipiZIDE  (GLUCOTROL ) 5 MG tablet Take 1 tablet (5 mg total) by mouth 2 (two) times daily before a meal. Patient not taking: Reported on 09/15/2024 06/20/23   Breylan Lefevers Jose, MD  glucose blood (TRUE METRIX BLOOD GLUCOSE TEST) test strip Use as instructed. Check blood glucose level by fingerstick twice per day.  E11.65 11/22/19   Fleming, Zelda W, NP  ibuprofen  (ADVIL ) 200 MG tablet Take 200 mg by mouth every 6 (six) hours as needed for moderate pain (pain score 4-6). Patient not taking: Reported on 09/15/2024    [provider]  lidocaine  (LIDODERM ) 5 % Place 1 patch onto the skin daily. Remove & Discard patch within 12 hours or as directed by MD Patient not taking: Reported on 09/15/2024 04/14/23   Hildegard Loge, PA-C  omeprazole  (PRILOSEC) 20 MG capsule Take 1 capsule (20 mg total) by mouth 2 (two) times daily before a meal. 09/15/24 12/14/24  Paxten Appelt, Emil Schanz, MD  ondansetron  (ZOFRAN -ODT) 4 MG disintegrating tablet Dissolve 1 tablet (4 mg total) by mouth every 8 (eight) hours as needed for nausea or vomiting. Patient not taking: Reported on 09/15/2024 03/08/22   Leigh Elspeth SQUIBB, MD  traMADol  (ULTRAM ) 50 MG tablet Take 1 tablet (50 mg total) by mouth every 6 (six) hours as needed for moderate pain (  pain score 4-6) or severe pain (pain score 7-10). Patient not taking: Reported on 09/15/2024 07/30/24   Vernetta Berg, MD    Allergies  Allergen Reactions   Iodine Cough, Hives, Shortness Of Breath, Swelling and Anaphylaxis   Ivp Dye [Iodinated Contrast Media] Anaphylaxis, Hives, Itching and Swelling   Shellfish Allergy Anaphylaxis, Hives and Itching    Patient Active Problem List   Diagnosis Date Noted   Chronic pain of left knee 09/15/2024   Ductal carcinoma in situ (DCIS) of right breast 08/13/2024   Vaginal discharge 07/22/2024   Suspected sleep  apnea 07/22/2024   Hypertension associated with diabetes (HCC) 01/03/2022   Dyslipidemia associated with type 2 diabetes mellitus (HCC) 01/03/2022   Uncontrolled type 2 diabetes mellitus with hyperglycemia (HCC) 03/11/2021   Fibroids 12/18/2019    Past Medical History:  Diagnosis Date   Abnormal Pap smear    colposcopy   Abnormal uterine bleeding (AUB)    Allergy    Diabetes mellitus without complication (HCC)    GERD (gastroesophageal reflux disease)    Heart murmur    Hyperlipidemia    Hypertension    Vertigo    hx    Past Surgical History:  Procedure Laterality Date   BREAST BIOPSY Right 07/10/2024   MM RT BREAST BX W LOC DEV 1ST LESION IMAGE BX SPEC STEREO GUIDE 07/10/2024 [facility]   BREAST BIOPSY  07/29/2024   MM RT RADIOACTIVE SEED LOC MAMMO GUIDE 07/29/2024 [facility]   COLONOSCOPY  2023   COLPOSCOPY     DILATATION & CURETTAGE/HYSTEROSCOPY WITH MYOSURE N/A 05/25/2021   Procedure: DILATATION & CURETTAGE/HYSTEROSCOPY WITH MYOSURE;  Surgeon: Storm Setter, DO;  Location: [facility];  Service: Gynecology;  Laterality: N/A;   DILATION AND CURETTAGE OF UTERUS     KNEE ARTHROSCOPY W/ ACL RECONSTRUCTION Left    LAPAROSCOPY N/A 05/25/2021   Procedure: LAPAROSCOPY DIAGNOSTIC,;  Surgeon: Storm Setter, DO;  Location: [facility];  Service: Gynecology;  Laterality: N/A;   TUBAL LIGATION     UPPER GI ENDOSCOPY  2023    Social History   Socioeconomic History   Marital status: Single    Spouse name: Not on file   Number of children: 3   Years of education: Not on file   Highest education level: Associate degree: occupational, Scientist, product/process development, or vocational program  Occupational History   Occupation: Psychologist, sport and exercise rep  Tobacco Use   Smoking status: Never    Passive exposure: Never   Smokeless tobacco: Never  Vaping Use   Vaping status: Never Used  Substance and Sexual Activity   Alcohol use: No   Drug use: No   Sexual activity:  Not Currently    Birth control/protection: Post-menopausal  Other Topics Concern   Not on file  Social History Narrative   Pt doesn't work    Pt lives alone    Social Drivers of Health   Financial Resource Strain: Low Risk  (06/02/2024)   Overall Financial Resource Strain (CARDIA)    Difficulty of Paying Living Expenses: Not hard at all  Food Insecurity: No Food Insecurity (08/20/2024)   Hunger Vital Sign    Worried About Running Out of Food in the Last Year: Never true    Ran Out of Food in the Last Year: Never true  Transportation Needs: No Transportation Needs (08/20/2024)   PRAPARE - Administrator, Civil Service (Medical): No    Lack of Transportation (Non-Medical): No  Physical Activity: Unknown (06/02/2024)  Exercise Vital Sign    Days of Exercise per Week: Patient declined    Minutes of Exercise per Session: Not on file  Recent Concern: Physical Activity - Insufficiently Active (03/12/2024)   Exercise Vital Sign    Days of Exercise per Week: 1 day    Minutes of Exercise per Session: 10 min  Stress: Stress Concern Present (06/02/2024)   [employer] of Occupational Health - Occupational Stress Questionnaire    Feeling of Stress: To some extent  Social Connections: Moderately Integrated (06/02/2024)   Social Connection and Isolation Panel    Frequency of Communication with Friends and Family: More than three times a week    Frequency of Social Gatherings with Friends and Family: More than three times a week    Attends Religious Services: More than 4 times per year    Active Member of [employer] or Organizations: Yes    Attends Engineer, structural: More than 4 times per year    Marital Status: Divorced  Catering manager Violence: Not At Risk (08/13/2024)   Humiliation, Afraid, Rape, and Kick questionnaire    Fear of Current or Ex-Partner: No    Emotionally Abused: No    Physically Abused: No    Sexually Abused: No    Family History  Problem Relation  Age of Onset   Aneurysm Mother    Heart disease Mother    Diabetes Mother    Hypertension Mother    Colon cancer Father        colon   Diabetes Sister    Kidney failure Sister    Heart failure Sister    Hodgkin's lymphoma Sister    Diabetes Brother    Heart disease Brother    Congestive Heart Failure Brother    Heart disease Brother    Diabetes Brother    Pancreatitis Brother    Heart failure Brother    Ovarian cancer Maternal Aunt        2021   Colon cancer Maternal Aunt    Esophageal cancer Paternal Aunt    Diabetes Maternal Grandmother    Hypertension Maternal Grandmother    Congestive Heart Failure Maternal Grandmother    Heart disease Paternal Grandmother    Hypertension Paternal Grandmother    Stroke Paternal Grandfather    Diabetes Daughter    Other Daughter        pre diabetic   Breast cancer Cousin    Kidney failure Nephew    Diabetes Nephew    Hodgkin's lymphoma Niece    Colon polyps Neg Hx    Stomach cancer Neg Hx    Sleep apnea Neg Hx      Review of Systems  Constitutional: Negative.  Negative for chills and fever.  HENT: Negative.  Negative for congestion and sore throat.   Respiratory: Negative.  Negative for cough and shortness of breath.   Cardiovascular: Negative.  Negative for chest pain and palpitations.  Gastrointestinal:  Negative for abdominal pain, diarrhea, nausea and vomiting.  Genitourinary:        Vaginal discharge  Musculoskeletal:  Positive for joint pain (Chronic left knee pain).  Skin: Negative.  Negative for rash.  Neurological: Negative.  Negative for dizziness and headaches.  All other systems reviewed and are negative.   Vitals:   09/15/24 1059  BP: 126/72  Pulse: 77  Temp: 98.7 F (37.1 C)  SpO2: 94%    Physical Exam Vitals reviewed.  Constitutional:      Appearance: Normal appearance.  HENT:  Head: Normocephalic.  Eyes:     Extraocular Movements: Extraocular movements intact.  Cardiovascular:     Rate and  Rhythm: Normal rate.  Pulmonary:     Effort: Pulmonary effort is normal.  Musculoskeletal:     Comments: Left knee: No swelling or significant tenderness.  Stable range of motion  Skin:    General: Skin is warm and dry.  Neurological:     Mental Status: She is alert and oriented to person, place, and time.  Psychiatric:        Mood and Affect: Mood normal.        Behavior: Behavior normal.      ASSESSMENT & PLAN: Problem List Items Addressed This Visit       Genitourinary   Bacterial vaginosis - Primary   Recent onset. Clinically stable.  Not sexually active. No red flag signs or symptoms. Genital wet prep done today Recommend to start Flagyl  500 mg twice a day for 7 days Has GYN appointment scheduled for next month        Relevant Medications   metroNIDAZOLE  (FLAGYL ) 500 MG tablet   Other Relevant Orders   Wet prep, genital     Other   Chronic pain of left knee   ACL repair 2003 Now constant pain for 2 weeks Pain management discussed Advised to take Tylenol  and or Advil  as needed Needs orthopedic evaluation Referral placed today      Relevant Orders   Ambulatory referral to Orthopedic Surgery   Other Visit Diagnoses       Gastroesophageal reflux disease, unspecified whether esophagitis present       Relevant Medications   omeprazole  (PRILOSEC) 20 MG capsule      Patient Instructions  Vaginal Infection (Bacterial Vaginosis): What to Know  Bacterial vaginosis is an infection of the vagina. It happens when the balance of normal germs (bacteria) in the vagina changes. If you don't get treated, it can make it easier for you to get other infections from sex. These are called sexually transmitted infections (STIs). If you're pregnant, you need to get treated right away. This infection can cause a baby to be born early or at a low birth weight. What are the causes? This infection happens when too many harmful germs grow in the vagina. You can't get this  infection from toilet seats, bedsheets, swimming pools, or things that touch your vagina. What increases the risk? Having sex with a new person or more than one person. Having sex without protection. Douching. Having an intrauterine device (IUD). Smoking. Using drugs or drinking alcohol. These can lead you to do risky things. Taking certain antibiotics. Being pregnant. What are the signs or symptoms? Some females have no symptoms. Symptoms may include: A gray or white discharge from your vagina. It can be watery or foamy. A fishy smell. This can happen after sex or during your menstrual period. Itching in and around your vagina. Burning or pain when you pee. How is this treated? This infection is treated with antibiotics. These may be given to you as: A pill. A cream for your vagina. A medicine that you put into your vagina (suppository). If the infection comes back, you may need more antibiotics. Follow these instructions at home: Medicines Take your medicines as told. Take or use your antibiotics as told. Do not stop using them even if you start to feel better. General instructions If the person you have sex with is a female, tell her that you have this infection.  She will need to follow up with her doctor. Female partners don't need to be treated. Do not have sex until you finish treatment. Drink more fluids as told. Keep your vagina and butt clean. Wash these areas with warm water each day. Wipe from front to back after you poop. If you're breastfeeding a baby, talk to your doctor if you should keep doing so during treatment. How is this prevented? Self-care Do not douche. Do not use deodorant sprays on your vagina. Wear cotton underwear. Do not wear tight pants and pantyhose, especially in the summer. Safe sex Use condoms the correct way and every time you have sex. Use dental dams to protect yourself during oral sex. Limit how many people you have sex with. Get tested  for STIs. The person you have sex with should also get tested. Drugs and alcohol Do not smoke, vape, or use nicotine or tobacco. Do not use drugs. Limit the amount of alcohol you drink because it can lead you to do risky things. Where to find more information To learn more: Go to TonerPromos.no. Click Health Topics A-Z. Type bacterial vaginosis in the search bar. American Sexual Health Association (ASHA): ashasexualhealth.org U.S. Department of Health and [employer], Office on Women's Health: TravelLesson.ca Contact a doctor if: Your symptoms don't get better, even after treatment. You have more discharge or pain when you pee. You have a fever or chills. You have pain in your belly or in the area between your hips. You have pain during sex. You bleed from your vagina between menstrual periods. This information is not intended to replace advice given to you by your health care provider. Make sure you discuss any questions you have with your health care provider. Document Revised: 05/16/2023 Document Reviewed: 05/16/2023 Elsevier Patient Education  2024 Elsevier Inc.     Emil Schaumann, MD [facility]

## 2024-09-15 NOTE — Patient Instructions (Signed)
 Vaginal Infection (Bacterial Vaginosis): What to Know  Bacterial vaginosis is an infection of the vagina. It happens when the balance of normal germs (bacteria) in the vagina changes. If you don't get treated, it can make it easier for you to get other infections from sex. These are called sexually transmitted infections (STIs). If you're pregnant, you need to get treated right away. This infection can cause a baby to be born early or at a low birth weight. What are the causes? This infection happens when too many harmful germs grow in the vagina. You can't get this infection from toilet seats, bedsheets, swimming pools, or things that touch your vagina. What increases the risk? Having sex with a new person or more than one person. Having sex without protection. Douching. Having an intrauterine device (IUD). Smoking. Using drugs or drinking alcohol. These can lead you to do risky things. Taking certain antibiotics. Being pregnant. What are the signs or symptoms? Some females have no symptoms. Symptoms may include: A gray or white discharge from your vagina. It can be watery or foamy. A fishy smell. This can happen after sex or during your menstrual period. Itching in and around your vagina. Burning or pain when you pee. How is this treated? This infection is treated with antibiotics. These may be given to you as: A pill. A cream for your vagina. A medicine that you put into your vagina (suppository). If the infection comes back, you may need more antibiotics. Follow these instructions at home: Medicines Take your medicines as told. Take or use your antibiotics as told. Do not stop using them even if you start to feel better. General instructions If the person you have sex with is a female, tell her that you have this infection. She will need to follow up with her doctor. Female partners don't need to be treated. Do not have sex until you finish treatment. Drink more fluids as  told. Keep your vagina and butt clean. Wash these areas with warm water each day. Wipe from front to back after you poop. If you're breastfeeding a baby, talk to your doctor if you should keep doing so during treatment. How is this prevented? Self-care Do not douche. Do not use deodorant sprays on your vagina. Wear cotton underwear. Do not wear tight pants and pantyhose, especially in the summer. Safe sex Use condoms the correct way and every time you have sex. Use dental dams to protect yourself during oral sex. Limit how many people you have sex with. Get tested for STIs. The person you have sex with should also get tested. Drugs and alcohol Do not smoke, vape, or use nicotine or tobacco. Do not use drugs. Limit the amount of alcohol you drink because it can lead you to do risky things. Where to find more information To learn more: Go to TonerPromos.no. Click Health Topics A-Z. Type bacterial vaginosis in the search bar. American Sexual Health Association (ASHA): ashasexualhealth.org U.S. Department of Health and [employer], Office on Women's Health: TravelLesson.ca Contact a doctor if: Your symptoms don't get better, even after treatment. You have more discharge or pain when you pee. You have a fever or chills. You have pain in your belly or in the area between your hips. You have pain during sex. You bleed from your vagina between menstrual periods. This information is not intended to replace advice given to you by your health care provider. Make sure you discuss any questions you have with your health care provider. Document  Revised: 05/16/2023 Document Reviewed: 05/16/2023 Elsevier Patient Education  2024 [employer].

## 2024-09-15 NOTE — Assessment & Plan Note (Addendum)
 Recent onset. Clinically stable.  Not sexually active. No red flag signs or symptoms. Genital wet prep done today Recommend to start Flagyl  500 mg twice a day for 7 days Has GYN appointment scheduled for next month

## 2024-09-15 NOTE — Addendum Note (Signed)
 Addended by: GALA GALLEY on: 09/15/2024 01:45 PM   Modules accepted: Orders

## 2024-09-16 ENCOUNTER — Ambulatory Visit
Admission: RE | Admit: 2024-09-16 | Discharge: 2024-09-16 | Disposition: A | Source: Ambulatory Visit | Attending: Radiation Oncology

## 2024-09-16 ENCOUNTER — Other Ambulatory Visit: Payer: Self-pay

## 2024-09-16 DIAGNOSIS — Z510 Encounter for antineoplastic radiation therapy: Secondary | ICD-10-CM | POA: Diagnosis not present

## 2024-09-16 LAB — RAD ONC ARIA SESSION SUMMARY
Course Elapsed Days: 15
Plan Fractions Treated to Date: 12
Plan Prescribed Dose Per Fraction: 2.67 Gy
Plan Total Fractions Prescribed: 15
Plan Total Prescribed Dose: 40.05 Gy
Reference Point Dosage Given to Date: 32.04 Gy
Reference Point Session Dosage Given: 2.67 Gy
Session Number: 12

## 2024-09-17 ENCOUNTER — Ambulatory Visit
Admission: RE | Admit: 2024-09-17 | Discharge: 2024-09-17 | Disposition: A | Source: Ambulatory Visit | Attending: Radiation Oncology

## 2024-09-17 ENCOUNTER — Other Ambulatory Visit: Payer: Self-pay

## 2024-09-17 ENCOUNTER — Inpatient Hospital Stay: Admitting: Hematology and Oncology

## 2024-09-17 VITALS — BP 114/50 | HR 68 | Temp 97.6°F | Resp 18 | Ht 63.0 in | Wt 246.9 lb

## 2024-09-17 DIAGNOSIS — Z510 Encounter for antineoplastic radiation therapy: Secondary | ICD-10-CM | POA: Diagnosis not present

## 2024-09-17 DIAGNOSIS — D0511 Intraductal carcinoma in situ of right breast: Secondary | ICD-10-CM | POA: Diagnosis not present

## 2024-09-17 DIAGNOSIS — Z780 Asymptomatic menopausal state: Secondary | ICD-10-CM

## 2024-09-17 LAB — RAD ONC ARIA SESSION SUMMARY
Course Elapsed Days: 16
Plan Fractions Treated to Date: 13
Plan Prescribed Dose Per Fraction: 2.67 Gy
Plan Total Fractions Prescribed: 15
Plan Total Prescribed Dose: 40.05 Gy
Reference Point Dosage Given to Date: 34.71 Gy
Reference Point Session Dosage Given: 2.67 Gy
Session Number: 13

## 2024-09-17 MED ORDER — ANASTROZOLE 1 MG PO TABS: 1.0000 mg | ORAL_TABLET | Freq: Every day | ORAL | 3 refills | Status: AC

## 2024-09-17 NOTE — Assessment & Plan Note (Signed)
 07/30/24: Rt Lumpectomy: HG DCIS with necrosis 5 mm, Margins Neg, ER 100%, PR 80%  Pathology counseling: I discussed the final pathology report of the patient provided  a copy of this report. I discussed the margins.  We also discussed the final staging along with previously performed ER/PR testing.  Adjuvant XRT: 9/23-10/10/25 Treatment: Adjuvant anastrozole (chosen because of H/O uterine polyps)  Anastrozole counseling: We discussed the risks and benefits of anti-estrogen therapy with aromatase inhibitors. These include but not limited to insomnia, hot flashes, mood changes, vaginal dryness, bone density loss, and weight gain. We strongly believe that the benefits far outweigh the risks. Patient understands these risks and consented to starting treatment. Planned treatment duration is 5 years.  RTC in 3 months for SCP visit

## 2024-09-17 NOTE — Progress Notes (Signed)
 Patient Care Team: Purcell Emil Schanz, MD as PCP - General (Internal Medicine) Tyree Nanetta SAILOR, RN as Oncology Nurse Navigator Gerome, Devere HERO, RN as Oncology Nurse Navigator Vernetta Berg, MD as Consulting Physician (General Surgery) Odean Potts, MD as Consulting Physician (Hematology and Oncology) Izell Domino, MD as Attending Physician (Radiation Oncology)  DIAGNOSIS:  Encounter Diagnosis  Name Primary?   Ductal carcinoma in situ (DCIS) of right breast Yes    SUMMARY OF ONCOLOGIC HISTORY: Oncology History  Ductal carcinoma in situ (DCIS) of right breast  07/30/2024 Initial Diagnosis   Screening mammogram detected right breast calcifications spanning 2.5 cm, biopsy: Focal epithelial atypia with ADH?  DCIS; right lumpectomy: High-grade DCIS with necrosis and calcifications 5 mm, margins negative ER 100%, PR 80%   07/30/2024 Surgery   Rt Lumpectomy: HG DCIS with necrosis 5 mm, Margins Neg, ER 100%, PR 80%   08/13/2024 Cancer Staging   Staging form: Breast, AJCC 8th Edition - Clinical: Stage 0 (cTis (DCIS), cN0, cM0, ER+, PR+, HER2: Not Assessed) - Signed by Odean Potts, MD on 08/13/2024 Stage prefix: Initial diagnosis Nuclear grade: G3     CHIEF COMPLIANT: follow up after radiation therapy is complete  HISTORY OF PRESENT ILLNESS:   History of Present Illness Anna Mann is a 63 year old female with a history of ductal carcinoma in situ who presents for follow-up after completing radiation therapy.  She has completed radiation therapy for DCIS of the right breast, with the last session scheduled for Friday. The only side effect experienced from the radiation therapy is itching.  She is concerned about the affordability of anastrozole if her insurance changes. She currently uses a pharmacy close to her home due to transportation limitations.     ALLERGIES:  is allergic to iodine, ivp dye [iodinated contrast media], and shellfish allergy.  MEDICATIONS:   Current Outpatient Medications  Medication Sig Dispense Refill   atorvastatin  (LIPITOR) 20 MG tablet Take 1 tablet (20 mg total) by mouth daily. 90 tablet 3   empagliflozin  (JARDIANCE ) 25 MG TABS tablet Take 1 tablet (25 mg total) by mouth daily. 90 tablet 3   glucose blood (TRUE METRIX BLOOD GLUCOSE TEST) test strip Use as instructed. Check blood glucose level by fingerstick twice per day.  E11.65 100 each 12   lisinopril  (ZESTRIL ) 20 MG tablet Take 1 tablet (20 mg total) by mouth daily. 90 tablet 3   ondansetron  (ZOFRAN -ODT) 4 MG disintegrating tablet Dissolve 1 tablet (4 mg total) by mouth every 8 (eight) hours as needed for nausea or vomiting. 30 tablet 1   TRUEplus Lancets 28G MISC Use as instructed. Check blood glucose level by fingerstick twice per day. 100 each 3   omeprazole  (PRILOSEC) 20 MG capsule Take 1 capsule (20 mg total) by mouth 2 (two) times daily before a meal. (Patient not taking: Reported on 09/17/2024) 180 capsule 1   No current facility-administered medications for this visit.    PHYSICAL EXAMINATION: ECOG PERFORMANCE STATUS: 1 - Symptomatic but completely ambulatory  Vitals:   09/17/24 0921  BP: (!) 114/50  Pulse: 68  Resp: 18  Temp: 97.6 F (36.4 C)  SpO2: 98%   Filed Weights   09/17/24 0921  Weight: 246 lb 14.4 oz (112 kg)    Physical Exam   (exam performed in the presence of a chaperone)  LABORATORY DATA:  I have reviewed the data as listed    Latest Ref Rng & Units 07/25/2024    3:30 PM 06/03/2024  10:35 AM 04/13/2023    7:36 PM  CMP  Glucose 70 - 99 mg/dL 873  861  838   BUN 8 - 23 mg/dL 8  12  15    Creatinine 0.44 - 1.00 mg/dL 9.30  9.27  9.27   Sodium 135 - 145 mmol/L 145  142  141   Potassium 3.5 - 5.1 mmol/L 3.7  4.0  3.9   Chloride 98 - 111 mmol/L 111  108  108   CO2 22 - 32 mmol/L 27  29  23    Calcium  8.9 - 10.3 mg/dL 9.3  9.4  9.3   Total Protein 6.0 - 8.3 g/dL  6.9  6.6   Total Bilirubin 0.2 - 1.2 mg/dL  0.5  0.5   Alkaline Phos  39 - 117 U/L  55  58   AST 0 - 37 U/L  17  19   ALT 0 - 35 U/L  15  15     Lab Results  Component Value Date   WBC 6.5 07/25/2024   HGB 14.0 07/25/2024   HCT 44.2 07/25/2024   MCV 92.3 07/25/2024   PLT 220 07/25/2024   NEUTROABS 3.0 06/03/2024    ASSESSMENT & PLAN:  Ductal carcinoma in situ (DCIS) of right breast 07/30/24: Rt Lumpectomy: HG DCIS with necrosis 5 mm, Margins Neg, ER 100%, PR 80%  Pathology counseling: I discussed the final pathology report of the patient provided  a copy of this report. I discussed the margins.  We also discussed the final staging along with previously performed ER/PR testing.  Adjuvant XRT: 9/23-10/10/25 Treatment: Adjuvant anastrozole (chosen because of H/O uterine polyps)  Anastrozole counseling: We discussed the risks and benefits of anti-estrogen therapy with aromatase inhibitors. These include but not limited to insomnia, hot flashes, mood changes, vaginal dryness, bone density loss, and weight gain. We strongly believe that the benefits far outweigh the risks. Patient understands these risks and consented to starting treatment. Planned treatment duration is 5 years.  RTC in 3 months for SCP visit  ------------------------------------- Assessment and Plan Assessment & Plan Ductal carcinoma in situ (DCIS) of right breast, status post radiation, initiating adjuvant anastrozole therapy DCIS of the right breast. Anastrozole chosen over tamoxifen due to uterine polyps. Anastrozole reduces estrogen production, with potential side effects including hot flashes, joint stiffness, and osteoporosis risk. Discussed cost-effective options for anastrozole. - Prescribe anastrozole 1 mg orally once daily for five years. - Send prescription to [facility] on [address] for a 09-ijb supply. - Advise monitoring cholesterol levels with primary care provider. - Order bone density test to be performed at [facility], preferably on a Friday. - Advise on the importance  of walking as a form of exercise. - Recommend calcium  and vitamin D  supplementation.  Risk of osteoporosis due to aromatase inhibitor therapy Increased osteoporosis risk due to anastrozole therapy. Differentiated osteoporosis from osteoarthritis. Emphasized monitoring bone density. - Order bone density test to be performed at [facility], preferably on a Friday. - Advise on the importance of walking as a form of exercise. - Recommend calcium  and vitamin D  supplementation.      No orders of the defined types were placed in this encounter.  The patient has a good understanding of the overall plan. she agrees with it. she will call with any problems that may develop before the next visit here.  I personally spent a total of 30 minutes in the care of the patient today including preparing to see the patient, getting/reviewing separately obtained  history, performing a medically appropriate exam/evaluation, counseling and educating, placing orders, referring and communicating with other health care professionals, documenting clinical information in the EHR, independently interpreting results, communicating results, and coordinating care.   Viinay K Alexzander Dolinger, MD 09/17/24

## 2024-09-18 ENCOUNTER — Other Ambulatory Visit (INDEPENDENT_AMBULATORY_CARE_PROVIDER_SITE_OTHER): Payer: Self-pay

## 2024-09-18 ENCOUNTER — Encounter: Payer: Self-pay | Admitting: Hematology and Oncology

## 2024-09-18 ENCOUNTER — Other Ambulatory Visit: Payer: Self-pay

## 2024-09-18 ENCOUNTER — Ambulatory Visit: Admitting: Orthopaedic Surgery

## 2024-09-18 ENCOUNTER — Ambulatory Visit
Admission: RE | Admit: 2024-09-18 | Discharge: 2024-09-18 | Disposition: A | Source: Ambulatory Visit | Attending: Radiation Oncology

## 2024-09-18 DIAGNOSIS — M25562 Pain in left knee: Secondary | ICD-10-CM

## 2024-09-18 DIAGNOSIS — G8929 Other chronic pain: Secondary | ICD-10-CM

## 2024-09-18 DIAGNOSIS — Z510 Encounter for antineoplastic radiation therapy: Secondary | ICD-10-CM | POA: Diagnosis not present

## 2024-09-18 LAB — RAD ONC ARIA SESSION SUMMARY
Course Elapsed Days: 17
Plan Fractions Treated to Date: 14
Plan Prescribed Dose Per Fraction: 2.67 Gy
Plan Total Fractions Prescribed: 15
Plan Total Prescribed Dose: 40.05 Gy
Reference Point Dosage Given to Date: 37.38 Gy
Reference Point Session Dosage Given: 2.67 Gy
Session Number: 14

## 2024-09-18 MED ORDER — METHYLPREDNISOLONE ACETATE 40 MG/ML IJ SUSP
40.0000 mg | INTRAMUSCULAR | Status: AC | PRN
  Administered 2024-09-18: 40 mg via INTRA_ARTICULAR

## 2024-09-18 MED ORDER — LIDOCAINE HCL 1 % IJ SOLN
3.0000 mL | INTRAMUSCULAR | Status: AC | PRN
  Administered 2024-09-18: 3 mL

## 2024-09-18 NOTE — Progress Notes (Signed)
 The patient is a 63 year old female I am seeing for the first time.  She is a diabetic but has good blood glucose control.  She has been dealing with left knee pain chronically for some time now.  Remotely she did tear her ACL years ago and had a ACL reconstruction of the left knee.  She says the pain is off-and-on in severity but sometimes she is trips and stumbles and has some pain with her left knee.  She is in recovery from breast cancer right now and has a few more treatments remaining.  I was able to review her past medical history and medications within epic.  Her last hemoglobin A1c was in the low 6 range.  On exam her left knee has well-healed surgical incisions.  She has very painful arc of motion of that knee with patellofemoral crepitation and it has a lot of pain with flexion past 90 degrees.  The knee feels stable.  2 views of the left knee show severe end-stage bone-on-bone arthritis with osteophytes in all 3 compartments and significant joint space narrowing especially patellofemoral and medial that are bone-on-bone.  There are 2 retained screws in the femur and in the tibia from the ACL reconstruction.  We had a long thorough discussion about her knee.  Her BMI is 43.74 so she is going to work on weight loss but there is not a large soft tissue envelope around her left knee.  She plans on getting through her breast transfer treatment and I agree with this as well.  I did recommend a steroid injection in her left knee today and she agreed to this and tolerated well knowing that she will watch her blood glucose closely.  I would like to see her back in 3 months with a repeat weight and BMI calculation but also to talk further about knee replacement surgery at some point.    Procedure Note  Patient: Anna Mann             Date of Birth: 08-Jun-1961           MRN: [id number]             Visit Date: 09/18/2024  Procedures: Visit Diagnoses:  1. Chronic pain of left knee     Large  Joint Inj: L knee on 09/18/2024 2:36 PM Indications: diagnostic evaluation and pain Details: 22 G 1.5 in needle, superolateral approach  Arthrogram: No  Medications: 3 mL lidocaine  1 %; 40 mg methylPREDNISolone  acetate 40 MG/ML Outcome: tolerated well, no immediate complications Procedure, treatment alternatives, risks and benefits explained, specific risks discussed. Consent was given by the patient. Immediately prior to procedure a time out was called to verify the correct patient, procedure, equipment, support staff and site/side marked as required. Patient was prepped and draped in the usual sterile fashion.

## 2024-09-19 ENCOUNTER — Other Ambulatory Visit: Payer: Self-pay

## 2024-09-19 ENCOUNTER — Ambulatory Visit
Admission: RE | Admit: 2024-09-19 | Discharge: 2024-09-19 | Disposition: A | Discharge status: Home / Self Care | Source: Ambulatory Visit | Attending: Radiation Oncology | Admitting: Radiation Oncology

## 2024-09-19 DIAGNOSIS — Z510 Encounter for antineoplastic radiation therapy: Secondary | ICD-10-CM | POA: Diagnosis not present

## 2024-09-19 LAB — RAD ONC ARIA SESSION SUMMARY
Course Elapsed Days: 18
Plan Fractions Treated to Date: 15
Plan Prescribed Dose Per Fraction: 2.67 Gy
Plan Total Fractions Prescribed: 15
Plan Total Prescribed Dose: 40.05 Gy
Reference Point Dosage Given to Date: 40.05 Gy
Reference Point Session Dosage Given: 2.67 Gy
Session Number: 15

## 2024-09-22 ENCOUNTER — Ambulatory Visit: Payer: Self-pay | Admitting: Neurology

## 2024-09-22 DIAGNOSIS — G4733 Obstructive sleep apnea (adult) (pediatric): Secondary | ICD-10-CM

## 2024-09-22 LAB — WET PREP, GENITAL

## 2024-09-22 LAB — SPECIMEN STATUS REPORT

## 2024-09-22 NOTE — Procedures (Unsigned)
 Physician Interpretation: Please see link under Procedure Tab or under Encounters tab for physician report, technical report, as well as O2 titration and/or PAP titration tables (if applicable).

## 2024-09-23 LAB — SPECIMEN STATUS REPORT

## 2024-09-23 LAB — CHLAMYDIA/GONOCOCCUS/TRICHOMONAS, NAA
Chlamydia by NAA: NEGATIVE
Gonococcus by NAA: NEGATIVE
Trich vag by NAA: NEGATIVE

## 2024-09-24 ENCOUNTER — Encounter: Payer: Self-pay | Admitting: Licensed Clinical Social Worker

## 2024-09-24 NOTE — Radiation Completion Notes (Signed)
 Patient Name: Anna Mann, Anna Mann MRN: [id number] Date of Birth: 11-Dec-1961 Referring Physician: VICENTA POLI, M.D. Date of Service: 2024-09-24 Radiation Oncologist: Lauraine Golden, M.D. [facility]                             RADIATION ONCOLOGY END OF TREATMENT NOTE     Diagnosis: D05.11 Intraductal carcinoma in situ of right breast Staging on 2024-08-13: Ductal carcinoma in situ (DCIS) of right breast T=cTis (DCIS), N=cN0, M=cM0 Intent: Curative     ==========DELIVERED PLANS==========  First Treatment Date: 2024-09-01 Last Treatment Date: 2024-09-19   Plan Name: Breast_R Site: Breast, Right Technique: 3D Mode: Photon Dose Per Fraction: 2.67 Gy Prescribed Dose (Delivered / Prescribed): 40.05 Gy / 40.05 Gy Prescribed Fxs (Delivered / Prescribed): 15 / 15     ==========ON TREATMENT VISIT DATES========== 2024-09-01, 2024-09-08, 2024-09-15     ==========UPCOMING VISITS========== 12/22/2024 [facility] OFFICE VISIT POLI Lonni GRADE, MD  12/19/2024 [facility] VISIT [place] Morna Pickle, NP  11/24/2024 [facility] [place] OFFICE VISIT Purcell Emil Schanz, MD  10/31/2024 [facility] LAB ONLY [facility]  10/31/2024 [facility] GEN COUNSEL [address] Pierce, Counselor  10/28/2024 [facility] Jeralyn Crutch, MD  10/16/2024 [facility] FOLLOW UP 20 Golden Lauraine, MD        ==========APPENDIX - ON TREATMENT VISIT NOTES==========   See weekly On Treatment Notes in Epic for details in the Media tab (listed as Progress notes on the On Treatment Visit Dates listed above).

## 2024-09-24 NOTE — Progress Notes (Signed)
 [facility] CSW Progress Note  Patient called to check on status of grant applications. CSW informed patient that [employer] can take up to 8 weeks.  CSW sent message to [employer] to request status update on application submitted on 09/12/24.      Avielle Imbert E Kawanna Christley, LCSW Clinical Social Worker [facility]    Patient is participating in a Managed Medicaid Plan:  Yes

## 2024-09-25 ENCOUNTER — Encounter: Payer: Self-pay | Admitting: Obstetrics and Gynecology

## 2024-09-29 NOTE — Telephone Encounter (Signed)
 Patient has an appointment on 10/28/24.   Strummer Canipe,RN

## 2024-10-01 ENCOUNTER — Encounter: Payer: Self-pay | Admitting: Emergency Medicine

## 2024-10-01 ENCOUNTER — Ambulatory Visit
Admission: EM | Admit: 2024-10-01 | Discharge: 2024-10-01 | Disposition: A | Discharge status: Home / Self Care | Source: Ambulatory Visit | Attending: Family Medicine | Admitting: Family Medicine

## 2024-10-01 ENCOUNTER — Ambulatory Visit
Admission: RE | Admit: 2024-10-01 | Discharge: 2024-10-01 | Disposition: A | Discharge status: Home / Self Care | Source: Ambulatory Visit

## 2024-10-01 DIAGNOSIS — N760 Acute vaginitis: Secondary | ICD-10-CM

## 2024-10-01 DIAGNOSIS — B3731 Acute candidiasis of vulva and vagina: Secondary | ICD-10-CM | POA: Insufficient documentation

## 2024-10-01 LAB — POCT URINE DIPSTICK
Bilirubin, UA: NEGATIVE
Glucose, UA: 500 mg/dL — AB
Ketones, POC UA: NEGATIVE mg/dL
Nitrite, UA: NEGATIVE
POC PROTEIN,UA: NEGATIVE
Spec Grav, UA: 1.025 (ref 1.010–1.025)
Urobilinogen, UA: 0.2 U/dL
pH, UA: 5.5 (ref 5.0–8.0)

## 2024-10-01 MED ORDER — NYSTATIN 100000 UNIT/GM EX POWD: 1.0000 | Freq: Three times a day (TID) | CUTANEOUS | 1 refills | Status: DC

## 2024-10-01 MED ORDER — FLUCONAZOLE 100 MG PO TABS: ORAL_TABLET | ORAL | 0 refills | Status: AC

## 2024-10-01 NOTE — ED Triage Notes (Signed)
 Pt presents c/o Vaginal d/c x almost 2 months. Pt states,  Ever since August of this year I've had a watery discharge. I've been treated for a yeast infection but it keeps coming back. Now I have a rash between my thighs and butt cheeks from the constant discharge. There is itching in crease of my thigh, inner thigh, and the upper part of my vagina as well as tiny bumps. I finished the meds last Friday and none of the sxs have improved. Pt's pcp referred her to OBGYN but appt isn't until 10/28/24. Last pap 02/2022.

## 2024-10-01 NOTE — Discharge Instructions (Addendum)
 The urinalysis showed only a trace of blood, most likely within normal limits.  Fluconazole  100 mg--2 tablets by mouth the first day then 1 tablet by mouth daily for 6 more days.  I am hoping that taking this medication this way is more effective for you.  Nystatin powder--apply 3 times daily for 2 to 3 weeks to the groin areas and bottom.  Staff will notify you if there is anything positive on the swab. It can take 2-3 days for the tests to result, depending on the day of the week your test was taken. You will only be notified if there are any positives on the testing; test results will also go to your MyChart if you are signed up for MyChart.

## 2024-10-01 NOTE — ED Provider Notes (Signed)
 [facility]    CSN: [id number] Arrival date & time: 10/01/24  1053      History   Chief Complaint Chief Complaint  Patient presents with   Vaginal Concern    Possible UTI    HPI Anna Mann is a 63 y.o. female.   HPI Here for persistent vaginal discharge and itching.  It began 2 to 3 months ago.  She states she was already having this trouble before she started her anastrozole in about August of this year for her DCIS diagnosis  No fever or abdominal pain but she has had a couple of sweats.  She is allergic to IVP dye  She has received 2 dose course of fluconazole  for yeast infection and was positive for Candida on a swab done here August 31.  She states she did not improve with that  Her primary then later prescribed metronidazole  tablets for possible BV.  That also did not help  She does note some irritation and rash around her inguinal areas and perineum also. Past Medical History:  Diagnosis Date   Abnormal Pap smear    colposcopy   Abnormal uterine bleeding (AUB)    Allergy    Diabetes mellitus without complication (HCC)    GERD (gastroesophageal reflux disease)    Heart murmur    Hyperlipidemia    Hypertension    Vertigo    hx    Patient Active Problem List   Diagnosis Date Noted   Chronic pain of left knee 09/15/2024   Bacterial vaginosis 09/15/2024   Ductal carcinoma in situ (DCIS) of right breast 08/13/2024   Vaginal discharge 07/22/2024   Suspected sleep apnea 07/22/2024   Hypertension associated with diabetes (HCC) 01/03/2022   Dyslipidemia associated with type 2 diabetes mellitus (HCC) 01/03/2022   Uncontrolled type 2 diabetes mellitus with hyperglycemia (HCC) 03/11/2021   Fibroids 12/18/2019    Past Surgical History:  Procedure Laterality Date   BREAST BIOPSY Right 07/10/2024   MM RT BREAST BX W LOC DEV 1ST LESION IMAGE BX SPEC STEREO GUIDE 07/10/2024 [facility]   BREAST BIOPSY  07/29/2024   MM RT RADIOACTIVE SEED  LOC MAMMO GUIDE 07/29/2024 [facility]   COLONOSCOPY  2023   COLPOSCOPY     DILATATION & CURETTAGE/HYSTEROSCOPY WITH MYOSURE N/A 05/25/2021   Procedure: DILATATION & CURETTAGE/HYSTEROSCOPY WITH MYOSURE;  Surgeon: Storm Setter, DO;  Location: [facility];  Service: Gynecology;  Laterality: N/A;   DILATION AND CURETTAGE OF UTERUS     KNEE ARTHROSCOPY W/ ACL RECONSTRUCTION Left    LAPAROSCOPY N/A 05/25/2021   Procedure: LAPAROSCOPY DIAGNOSTIC,;  Surgeon: Storm Setter, DO;  Location: [facility];  Service: Gynecology;  Laterality: N/A;   TUBAL LIGATION     UPPER GI ENDOSCOPY  2023    OB History     Gravida  3   Para  3   Term  3   Preterm      AB      Living  3      SAB      IAB      Ectopic      Multiple      Live Births               Home Medications    Prior to Admission medications   Medication Sig Start Date End Date Taking? Authorizing Provider  fluconazole  (DIFLUCAN ) 100 MG tablet 2 tablets by mouth the first day, then 1 tablet daily for 6  more days. 10/01/24  Yes Annakate Soulier K, MD  nystatin (MYCOSTATIN/NYSTOP) powder Apply 1 Application topically 3 (three) times daily. For 2-3 weeks 10/01/24  Yes Jaki Hammerschmidt K, MD  anastrozole (ARIMIDEX) 1 MG tablet Take 1 tablet (1 mg total) by mouth daily. 09/17/24   Odean Potts, MD  atorvastatin  (LIPITOR) 20 MG tablet Take 1 tablet (20 mg total) by mouth daily. 06/03/24   Purcell Emil Schanz, MD  empagliflozin  (JARDIANCE ) 25 MG TABS tablet Take 1 tablet (25 mg total) by mouth daily. 06/03/24   Sagardia, Miguel Jose, MD  glucose blood (TRUE METRIX BLOOD GLUCOSE TEST) test strip Use as instructed. Check blood glucose level by fingerstick twice per day.  E11.65 11/22/19   Fleming, Zelda W, NP  lisinopril  (ZESTRIL ) 20 MG tablet Take 1 tablet (20 mg total) by mouth daily. 06/03/24   Purcell Emil Schanz, MD  omeprazole  (PRILOSEC) 20 MG capsule Take 1 capsule (20 mg total) by  mouth 2 (two) times daily before a meal. Patient not taking: Reported on 09/17/2024 09/15/24 12/14/24  Sagardia, Miguel Jose, MD  ondansetron  (ZOFRAN -ODT) 4 MG disintegrating tablet Dissolve 1 tablet (4 mg total) by mouth every 8 (eight) hours as needed for nausea or vomiting. 03/08/22   Armbruster, Elspeth SQUIBB, MD  TRUEplus Lancets 28G MISC Use as instructed. Check blood glucose level by fingerstick twice per day. 11/22/19   Theotis Haze ORN, NP    Family History Family History  Problem Relation Age of Onset   Aneurysm Mother    Heart disease Mother    Diabetes Mother    Hypertension Mother    Colon cancer Father        colon   Diabetes Sister    Kidney failure Sister    Heart failure Sister    Hodgkin's lymphoma Sister    Diabetes Brother    Heart disease Brother    Congestive Heart Failure Brother    Heart disease Brother    Diabetes Brother    Pancreatitis Brother    Heart failure Brother    Ovarian cancer Maternal Aunt        2021   Colon cancer Maternal Aunt    Esophageal cancer Paternal Aunt    Diabetes Maternal Grandmother    Hypertension Maternal Grandmother    Congestive Heart Failure Maternal Grandmother    Heart disease Paternal Grandmother    Hypertension Paternal Grandmother    Stroke Paternal Grandfather    Diabetes Daughter    Other Daughter        pre diabetic   Breast cancer Cousin    Kidney failure Nephew    Diabetes Nephew    Hodgkin's lymphoma Niece    Colon polyps Neg Hx    Stomach cancer Neg Hx    Sleep apnea Neg Hx     Social History Social History   Tobacco Use   Smoking status: Never    Passive exposure: Never   Smokeless tobacco: Never  Vaping Use   Vaping status: Never Used  Substance Use Topics   Alcohol use: No   Drug use: No     Allergies   Iodine, Ivp dye [iodinated contrast media], and Shellfish allergy   Review of Systems Review of Systems   Physical Exam Triage Vital Signs ED Triage Vitals  Encounter Vitals Group      BP 10/01/24 1115 130/74     Girls Systolic BP Percentile --      Girls Diastolic BP Percentile --  Boys Systolic BP Percentile --      Boys Diastolic BP Percentile --      Pulse Rate 10/01/24 1115 (!) 58     Resp 10/01/24 1115 18     Temp 10/01/24 1115 98.2 F (36.8 C)     Temp Source 10/01/24 1115 Oral     SpO2 10/01/24 1115 98 %     Weight 10/01/24 1115 246 lb 14.6 oz (112 kg)     Height --      Head Circumference --      Peak Flow --      Pain Score 10/01/24 1114 0     Pain Loc --      Pain Education --      Exclude from Growth Chart --    No data found.  Updated Vital Signs BP 130/74 (BP Location: Left Arm)   Pulse (!) 58   Temp 98.2 F (36.8 C) (Oral)   Resp 18   Wt 112 kg   LMP  (LMP Unknown) Comment: irregular  SpO2 98%   BMI 43.74 kg/m   Visual Acuity Right Eye Distance:   Left Eye Distance:   Bilateral Distance:    Right Eye Near:   Left Eye Near:    Bilateral Near:     Physical Exam Vitals reviewed.  Constitutional:      General: She is not in acute distress.    Appearance: She is not toxic-appearing.  Genitourinary:    Comments: Chaperone present during the time of exam.  There is a confluent rough rash in her inguinal folds and on her perineum.  There is no overt discharge noted on the perineum.  Self vaginal swab had already been accomplished in the bathroom before exam. Skin:    Coloration: Skin is not pale.  Neurological:     Mental Status: She is alert and oriented to person, place, and time.  Psychiatric:        Behavior: Behavior normal.      UC Treatments / Results  Labs (all labs ordered are listed, but only abnormal results are displayed) Labs Reviewed  POCT URINE DIPSTICK - Abnormal; Notable for the following components:      Result Value   Clarity, UA cloudy (*)    Glucose, UA =500 (*)    Blood, UA trace-intact (*)    Leukocytes, UA Trace (*)    All other components within normal limits  CERVICOVAGINAL ANCILLARY ONLY     EKG   Radiology No results found.  Procedures Procedures (including critical care time)  Medications Ordered in UC Medications - No data to display  Initial Impression / Assessment and Plan / UC Course  I have reviewed the triage vital signs and the nursing notes.  Pertinent labs & imaging results that were available during my care of the patient were reviewed by me and considered in my medical decision making (see chart for details).   I discussed with her that I hope that the fluconazole  prescribed in the daily dosing will be more effective for her.  7-day supply sent.  Also Nystop powder is sent in to treat the extensive perineal candidiasis she has.  She has follow-up with OB/GYN in about a month. Final Clinical Impressions(s) / UC Diagnoses   Final diagnoses:  Acute vaginitis  Candidiasis of genitalia in female     Discharge Instructions      The urinalysis showed only a trace of blood, most likely within normal limits.  Fluconazole  100  mg--2 tablets by mouth the first day then 1 tablet by mouth daily for 6 more days.  I am hoping that taking this medication this way is more effective for you.  Nystatin powder--apply 3 times daily for 2 to 3 weeks to the groin areas and bottom.  Staff will notify you if there is anything positive on the swab. It can take 2-3 days for the tests to result, depending on the day of the week your test was taken. You will only be notified if there are any positives on the testing; test results will also go to your MyChart if you are signed up for MyChart.       ED Prescriptions     Medication Sig Dispense Auth. Provider   fluconazole  (DIFLUCAN ) 100 MG tablet 2 tablets by mouth the first day, then 1 tablet daily for 6 more days. 8 tablet Izzy Doubek K, MD   nystatin (MYCOSTATIN/NYSTOP) powder Apply 1 Application topically 3 (three) times daily. For 2-3 weeks 60 g Vonna Sharlet POUR, MD      PDMP not reviewed this encounter.    Vonna Sharlet POUR, MD 10/01/24 [phone]

## 2024-10-02 NOTE — Telephone Encounter (Signed)
Pt has returned call to RN, please call back

## 2024-10-02 NOTE — Telephone Encounter (Signed)
 Pt called retuning call Informed Nurse will call back

## 2024-10-02 NOTE — Telephone Encounter (Signed)
 Petrea Fredenburg D, CMA  Zott, Glade; Darrel Boyer; Lavon, Tammy New orders have been placed for the above pt, DOB: 2061-01-17 Thanks

## 2024-10-02 NOTE — Telephone Encounter (Signed)
-----   Message from True Mar sent at 09/22/2024  5:43 PM EDT ----- Patient referred by PCP, seen by me on 08/19/24, diagnostic PSG on 09/14/24.    Please call and notify the patient that the recent sleep study did confirm the diagnosis of obstructive sleep apnea. OSA is overall mild, but gets much worse in dream/REM sleep and is worth treating  to see if she feels better after treatment. To that end I recommend treatment for this in the form of autoPAP, which means, that we don't have to bring her back for a second sleep study with CPAP,  but will let him try an autoPAP machine at home, through a DME company (of her choice, or as per insurance requirement). The DME representative will educate her on how to use the machine, how to put  the mask on, etc. I have placed an order in the chart. Please send referral, talk to patient, send report to referring MD. We will need a FU in sleep clinic for 10 weeks post-PAP set up, please  arrange that with me or one of our NPs. Thanks,   True Mar, MD, PhD [facility])   ----- Message ----- From: Rebecka Fleeta Higashi In One Three One Sent: 09/22/2024   5:42 PM EDT To: True Mar, MD

## 2024-10-02 NOTE — Telephone Encounter (Signed)
 I called pt. I advised pt that Dr. Buck reviewed their sleep study results and found that pt mild OSA, increased during REM. Dr. Buck recommends that pt starts CPAP. I reviewed PAP compliance expectations with the pt. Pt is agreeable to starting a CPAP. I advised pt that an order will be sent to a DME, [facility], and [facility] will call the pt within about one week after they file with the pt's insurance. [facility] will show the pt how to use the machine, fit for masks, and troubleshoot the CPAP if needed.  Pt had no questions at this time but was encouraged to call back if questions arise. I have sent the order to [facility] and have received confirmation that they have received the order. Pt advised to let us  know when machine is received so we can schedule initial visit. She verbally understood and was appreciative.

## 2024-10-02 NOTE — Telephone Encounter (Signed)
 Called pt and LMVM for her to return call for sleep study results.

## 2024-10-03 ENCOUNTER — Ambulatory Visit (HOSPITAL_COMMUNITY): Payer: Self-pay

## 2024-10-03 LAB — CERVICOVAGINAL ANCILLARY ONLY
Bacterial Vaginitis (gardnerella): NEGATIVE
Candida Glabrata: POSITIVE — AB
Candida Vaginitis: POSITIVE — AB
Chlamydia: NEGATIVE
Comment: NEGATIVE
Comment: NEGATIVE
Comment: NEGATIVE
Comment: NEGATIVE
Comment: NEGATIVE
Comment: NORMAL
Neisseria Gonorrhea: NEGATIVE
Trichomonas: NEGATIVE

## 2024-10-06 ENCOUNTER — Encounter: Payer: Self-pay | Admitting: Hematology and Oncology

## 2024-10-08 NOTE — Progress Notes (Signed)
 Anna Mann is here today for follow up post radiation to the breast.   Breast Side:Right   They completed their radiation on: 09/19/2024  Patient  Does the patient complain of any of the following: Post radiation skin issues: Darkening on the site of the treatment site.  Breast Tenderness: None Breast Swelling: None Lymphadema: None Range of Motion limitations: None Fatigue post radiation: Fatigue Appetite good/fair/poor: Good, stays away from sugar  Additional comments if applicable:  Left leg pain

## 2024-10-13 ENCOUNTER — Encounter: Payer: Self-pay | Admitting: Radiology

## 2024-10-14 ENCOUNTER — Encounter: Payer: Self-pay | Admitting: Licensed Clinical Social Worker

## 2024-10-14 NOTE — Progress Notes (Signed)
 [facility] CSW Progress Note  Clinical Child Psychotherapist contacted patient by phone to notify pt that she was approved for [facility] [employer] [employer]out and to confirm that pt received the payment.  CSW has not received any update from [facility] despite follow-up attempts.      Follow Up Plan:  Patient will contact CSW with any support or resource needs    Omario Ander E Lamia Mariner, LCSW Clinical Social Worker [facility]    Patient is participating in a Managed Medicaid Plan:  Yes

## 2024-10-16 ENCOUNTER — Ambulatory Visit
Admission: RE | Admit: 2024-10-16 | Discharge: 2024-10-16 | Disposition: A | Discharge status: Home / Self Care | Source: Ambulatory Visit | Attending: Radiation Oncology | Admitting: Radiation Oncology

## 2024-10-16 DIAGNOSIS — D0511 Intraductal carcinoma in situ of right breast: Secondary | ICD-10-CM

## 2024-10-20 ENCOUNTER — Ambulatory Visit (HOSPITAL_BASED_OUTPATIENT_CLINIC_OR_DEPARTMENT_OTHER)
Admission: RE | Admit: 2024-10-20 | Discharge: 2024-10-20 | Disposition: A | Discharge status: Home / Self Care | Source: Ambulatory Visit | Attending: Hematology and Oncology | Admitting: Hematology and Oncology

## 2024-10-20 DIAGNOSIS — Z780 Asymptomatic menopausal state: Secondary | ICD-10-CM | POA: Insufficient documentation

## 2024-10-28 ENCOUNTER — Other Ambulatory Visit (HOSPITAL_COMMUNITY)
Admission: RE | Admit: 2024-10-28 | Discharge: 2024-10-28 | Disposition: A | Source: Ambulatory Visit | Attending: Obstetrics and Gynecology | Admitting: Obstetrics and Gynecology

## 2024-10-28 ENCOUNTER — Other Ambulatory Visit: Payer: Self-pay

## 2024-10-28 ENCOUNTER — Ambulatory Visit (INDEPENDENT_AMBULATORY_CARE_PROVIDER_SITE_OTHER): Admitting: Obstetrics and Gynecology

## 2024-10-28 VITALS — BP 126/81 | HR 78 | Wt 244.2 lb

## 2024-10-28 DIAGNOSIS — N840 Polyp of corpus uteri: Secondary | ICD-10-CM

## 2024-10-28 DIAGNOSIS — D219 Benign neoplasm of connective and other soft tissue, unspecified: Secondary | ICD-10-CM

## 2024-10-28 DIAGNOSIS — R102 Pelvic and perineal pain unspecified side: Secondary | ICD-10-CM

## 2024-10-28 DIAGNOSIS — K5909 Other constipation: Secondary | ICD-10-CM

## 2024-10-28 DIAGNOSIS — N814 Uterovaginal prolapse, unspecified: Secondary | ICD-10-CM

## 2024-10-28 DIAGNOSIS — Z124 Encounter for screening for malignant neoplasm of cervix: Secondary | ICD-10-CM

## 2024-10-28 NOTE — Progress Notes (Signed)
 NEW GYNECOLOGY PATIENT Patient name: Anna Mann MRN [id number]  Date of birth: Apr 16, 1961 Chief Complaint:   Gynecologic Exam   History:  Discussed the use of AI scribe software for clinical note transcription with the patient, who gave verbal consent to proceed.  History of Present Illness Anna Mann is a 63 year old female with a history of breast cancer who presents with a persistent groin rash and concerns about prolapse.  She has been experiencing a rash since August, primarily affecting the groin area and spreading to the vulva and hair-bearing regions. The rash is associated with a discharge and itching, particularly at night. She visited urgent care twice, in September and October, where she was diagnosed with bacterial and yeast infections. She has been using nystatin  powder, which has helped alleviate symptoms. She has made changes to her personal care products, using fragrance-free options, which had previously been recommended by rad/onc team.   She completed radiation treatment for breast cancer in October. She is concerned about the possibility of cervical cancer due to a family history of cervical cancer and her own history of abnormal Pap smears. She reports feeling bloated and experiencing abdominal discomfort similar to premenstrual symptoms, despite not having menstrual cycles. She has a history of uterine polyps, which were removed in May 2023, leading to cessation of prolonged bleeding. She was previously on Megace , which was discontinued after the procedure. She reports occasional spotting post-procedure but no significant bleeding since.  She experiences symptoms of prolapse, including discomfort and a sensation of heaviness, particularly when straining during bowel movements. She reports a family history of similar issues, as her mother experienced prolapse after multiple pregnancies.  She has been on Jardiance  since 2020 and recently started anastrozole  in October  after completing radiation therapy. She denies current sexual activity since 2007 and reports no significant watery discharge currently, though she experiences light discharge and itching primarily at night.          Gynecologic History No LMP recorded (lmp unknown). Patient is postmenopausal. Contraception: abstinence   OB History  Gravida Para Term Preterm AB Living  3 3 3   3   SAB IAB Ectopic Multiple Live Births          # Outcome Date GA Lbr Len/2nd Weight Sex Type Anes PTL Lv  3 Term      Vag-Spont     2 Term      Vag-Spont     1 Term      Vag-Spont        The following portions of the patient's history were reviewed and updated as appropriate: allergies, current medications, past family history, past medical history, past social history, past surgical history and problem list. Health Maintenance  Topic Date Due   Complete foot exam   Never done   Eye exam for diabetics  Never done   Zoster (Shingles) Vaccine (1 of 2) Never done   COVID-19 Vaccine (2 - Moderna risk series) 05/15/2020   Pap with HPV screening  05/21/2020   Pneumococcal Vaccine for age over 45 (2 of 2 - PCV) 10/02/2020   Stool Blood Test  05/03/2023   Flu Shot  03/10/2025*   Hemoglobin A1C  01/25/2025   Yearly kidney health urinalysis for diabetes  06/03/2025   Yearly kidney function blood test for diabetes  07/25/2025   Breast Cancer Screening  07/08/2026   DTaP/Tdap/Td vaccine (2 - Td or Tdap) 08/03/2032   Hepatitis C Screening  Completed   HIV  Screening  Completed   Hepatitis B Vaccine  Aged Out   HPV Vaccine  Aged Out   Meningitis B Vaccine  Aged Out   Colon Cancer Screening  Discontinued  *Topic was postponed. The date shown is not the original due date.     Review of Systems Pertinent items noted in HPI and remainder of comprehensive ROS otherwise negative.  Physical Exam:  BP 126/81   Pulse 78   Wt 244 lb 3.2 oz (110.8 kg)   LMP  (LMP Unknown) Comment: irregular  BMI 43.26 kg/m   Physical Exam Vitals and nursing note reviewed. Exam conducted with a chaperone present.  Constitutional:      Appearance: Normal appearance.  Pulmonary:     Effort: Pulmonary effort is normal.  Abdominal:     Palpations: Abdomen is soft.     Comments: Midepigastric pain that extends down midline of abdomen to umbilicus   Genitourinary:    General: Normal vulva.     Exam position: Lithotomy position.     Cervix: Normal.  Neurological:     Mental Status: She is alert.       Assessment and Plan:   Assessment & Plan Vulvovaginal candidiasis Recurrent candidiasis likely due to recent radiation therapy. Symptoms improved with nystatin . Differential includes skin yeast infection. - Refilled nystatin  powder for topical treatment.  Pelvic organ prolapse Causing discomfort and straining. Not dangerous unless causing significant issues. Discussed pelvic floor physical therapy and pessary as options. - Referred to pelvic floor physical therapy.  Constipation Possibly related to pelvic organ prolapse. Discussed benefit of pelvic floor physical therapy. - Referred to pelvic floor physical therapy.  Abdominal bloating and discomfort Possibly related to prolapse or other abdominal issues. Differential includes gallbladder or bowel issues. Previous colonoscopy normal. - Ordered pelvic ultrasound to assess endometrial lining - Will consider referral to GI for further evaluation if ultrasound is negative.  History of polyp of corpus uteri Previous D&C in 2023. No current bleeding. Discussed potential for recurrence, especially with breast cancer treatment. - Ordered pelvic ultrasound to assess for polyps.  History of breast cancer Recent radiation therapy completed. Currently on anastrozole .  - Noted risk of endometrial polyp formation during prior tamoxifen use   Follow-up: No follow-ups on file.      Carter Quarry, MD Obstetrician & Gynecologist, Faculty  Practice Minimally Invasive Gynecologic Surgery [facility] for [employer], [facility]

## 2024-10-31 ENCOUNTER — Inpatient Hospital Stay: Attending: Hematology and Oncology

## 2024-10-31 ENCOUNTER — Inpatient Hospital Stay

## 2024-10-31 DIAGNOSIS — Z8041 Family history of malignant neoplasm of ovary: Secondary | ICD-10-CM

## 2024-10-31 DIAGNOSIS — Z803 Family history of malignant neoplasm of breast: Secondary | ICD-10-CM | POA: Diagnosis not present

## 2024-10-31 DIAGNOSIS — D0511 Intraductal carcinoma in situ of right breast: Secondary | ICD-10-CM | POA: Insufficient documentation

## 2024-10-31 DIAGNOSIS — Z800 Family history of malignant neoplasm of digestive organs: Secondary | ICD-10-CM | POA: Diagnosis not present

## 2024-10-31 DIAGNOSIS — Z807 Family history of other malignant neoplasms of lymphoid, hematopoietic and related tissues: Secondary | ICD-10-CM | POA: Insufficient documentation

## 2024-10-31 LAB — GENETIC SCREENING ORDER

## 2024-10-31 NOTE — Progress Notes (Signed)
 REFERRING PROVIDER: Odean Potts, MD [address] [place],  [place] [address]  PRIMARY PROVIDER:  Purcell Emil Schanz, MD  PRIMARY REASON FOR VISIT:  1. Ductal carcinoma in situ (DCIS) of right breast   2. Family history of ovarian cancer   3. Family history of Hodgkin lymphoma   4. Family history of colon cancer   5. Family history of breast cancer     HISTORY OF PRESENT ILLNESS:   Anna Mann, a 63 y.o. female, was seen on 10/31/2024 for a [place] cancer genetics consultation at the request of Dr. Odean due to a personal history of breast cancer.  Anna Mann presents to clinic today to discuss the possibility of a hereditary predisposition to cancer, genetic testing, and to further clarify her future cancer risks, as well as potential cancer risks for family members.   CANCER HISTORY:  Oncology History  Ductal carcinoma in situ (DCIS) of right breast  07/30/2024 Initial Diagnosis   Screening mammogram detected right breast calcifications spanning 2.5 cm, biopsy: Focal epithelial atypia with ADH?  DCIS; right lumpectomy: High-grade DCIS with necrosis and calcifications 5 mm, margins negative ER 100%, PR 80%   07/30/2024 Surgery   Rt Lumpectomy: HG DCIS with necrosis 5 mm, Margins Neg, ER 100%, PR 80%   08/13/2024 Cancer Staging   Staging form: Breast, AJCC 8th Edition - Clinical: Stage 0 (cTis (DCIS), cN0, cM0, ER+, PR+, HER2: Not Assessed) - Signed by Odean Potts, MD on 08/13/2024 Stage prefix: Initial diagnosis Nuclear grade: G3    In 2025, at the age of 3, Anna Mann was diagnosed with DCIS of the right breast. The treatment plan was right lumpectomy and radiation and adjuvant anastrozole .   RELEVANT MEDICAL HISTORY AND RISK FACTORS:  Menarche was at age 64.  First live birth at age 14.  OCP use for approximately 5 years.  Ovaries intact: yes.  Hysterectomy: no.  Menopausal status: postmenopausal.  HRT use: 0 years. Mammogram within the last year:  yes. Breast density: b Number of breast biopsies: 1. Up to date with pelvic exams: yes. Colonoscopy: yes; most recent endoscopy and colonoscopy in 05/02/22, 1 hyperplastic polyp on colonoscopy and fundic glad polyps on endoscopy Exposures: reports working in an area that had a very strong chemical smell, she used to work somewhere that [employer].   Past Medical History:  Diagnosis Date   Abnormal Pap smear    colposcopy   Abnormal uterine bleeding (AUB)    Allergy    Diabetes mellitus without complication (HCC)    Family history of breast cancer    Family history of colon cancer    Family history of colon cancer    Family history of Hodgkin lymphoma    Family history of ovarian cancer    GERD (gastroesophageal reflux disease)    Heart murmur    Hyperlipidemia    Hypertension    Vertigo    hx    Past Surgical History:  Procedure Laterality Date   BREAST BIOPSY Right 07/10/2024   MM RT BREAST BX W LOC DEV 1ST LESION IMAGE BX SPEC STEREO GUIDE 07/10/2024 [facility]   BREAST BIOPSY  07/29/2024   MM RT RADIOACTIVE SEED LOC MAMMO GUIDE 07/29/2024 [facility]   COLONOSCOPY  2023   COLPOSCOPY     DILATATION & CURETTAGE/HYSTEROSCOPY WITH MYOSURE N/A 05/25/2021   Procedure: DILATATION & CURETTAGE/HYSTEROSCOPY WITH MYOSURE;  Surgeon: Storm Setter, DO;  Location: [facility];  Service: Gynecology;  Laterality: N/A;  DILATION AND CURETTAGE OF UTERUS     KNEE ARTHROSCOPY W/ ACL RECONSTRUCTION Left    LAPAROSCOPY N/A 05/25/2021   Procedure: LAPAROSCOPY DIAGNOSTIC,;  Surgeon: Storm Setter, DO;  Location: [facility];  Service: Gynecology;  Laterality: N/A;   TUBAL LIGATION     UPPER GI ENDOSCOPY  2023    Social History   Socioeconomic History   Marital status: Single    Spouse name: Not on file   Number of children: 3   Years of education: Not on file   Highest education level: Associate degree: occupational, scientist, product/process development, or  vocational program  Occupational History   Occupation: Psychologist, sport and exercise rep  Tobacco Use   Smoking status: Never    Passive exposure: Never   Smokeless tobacco: Never  Vaping Use   Vaping status: Never Used  Substance and Sexual Activity   Alcohol use: No   Drug use: No   Sexual activity: Not Currently    Birth control/protection: Post-menopausal  Other Topics Concern   Not on file  Social History Narrative   Pt doesn't work    Pt lives alone    Social Drivers of Health   Financial Resource Strain: Low Risk  (06/02/2024)   Overall Financial Resource Strain (CARDIA)    Difficulty of Paying Living Expenses: Not hard at all  Food Insecurity: No Food Insecurity (10/28/2024)   Hunger Vital Sign    Worried About Running Out of Food in the Last Year: Never true    Ran Out of Food in the Last Year: Never true  Transportation Needs: No Transportation Needs (10/28/2024)   PRAPARE - Administrator, Civil Service (Medical): No    Lack of Transportation (Non-Medical): No  Physical Activity: Unknown (06/02/2024)   Exercise Vital Sign    Days of Exercise per Week: Patient declined    Minutes of Exercise per Session: Not on file  Recent Concern: Physical Activity - Insufficiently Active (03/12/2024)   Exercise Vital Sign    Days of Exercise per Week: 1 day    Minutes of Exercise per Session: 10 min  Stress: Stress Concern Present (06/02/2024)   [employer] of Occupational Health - Occupational Stress Questionnaire    Feeling of Stress: To some extent  Social Connections: Moderately Integrated (06/02/2024)   Social Connection and Isolation Panel    Frequency of Communication with Friends and Family: More than three times a week    Frequency of Social Gatherings with Friends and Family: More than three times a week    Attends Religious Services: More than 4 times per year    Active Member of [employer] or Organizations: Yes    Attends Engineer, Structural: More than 4 times  per year    Marital Status: Divorced     FAMILY HISTORY:  We obtained a detailed, 4-generation family history.  Significant diagnoses are listed below: Family History  Problem Relation Age of Onset   Aneurysm Mother    Heart disease Mother    Diabetes Mother    Hypertension Mother    Colon cancer Father 59 - 39   Diabetes Sister    Kidney failure Sister    Heart failure Sister    Hodgkin's lymphoma Sister    Diabetes Brother    Heart disease Brother    Congestive Heart Failure Brother    Heart disease Brother    Diabetes Brother    Pancreatitis Brother        potentially pancreatic cancer  Heart failure Brother    Ovarian cancer Maternal Aunt        2021   Colon cancer Maternal Aunt        may have also had breast cancer prior to this colon cancer   Esophageal cancer Paternal Aunt 54 - 79   Diabetes Maternal Grandmother    Hypertension Maternal Grandmother    Congestive Heart Failure Maternal Grandmother    Heart disease Paternal Grandmother    Hypertension Paternal Grandmother    Stroke Paternal Grandfather    Diabetes Daughter        her father had colon cancer at age 45 and died from colon cancer   Other Daughter        pre diabetic   Breast cancer Cousin    Kidney failure Nephew    Diabetes Nephew    Hodgkin's lymphoma Niece 66   Breast cancer Maternal Cousin    Colon polyps Neg Hx    Stomach cancer Neg Hx    Sleep apnea Neg Hx     Anna Mann reports the following family history: All three of her children are by different fathers. One of her daughters father was diagnosed with colon cancer at age 56. Her brother had pancreatitis but she reports she thinks this might have been pancreatic cancer but isn't sure as her brother didn't share much of his health information with her.  Her sister had Hodgkin Lymphoma Her niece had Hodgkin lymphoma diagnosed age 62 Her mother reportedly had her breast removed and she thought it was due to breast cancer but her  maternal aunt said it wasn't due to breast cancer, rather done to prevent breast cancer Her maternal aunt had ovarian cancer Her maternal aunt had colon cancer and she thinks she might have had breast cancer before the colon cancer but isn't sure She reports a maternal cousin with breast cancer who died of breast cancer. And another maternal cousin with cancer, type unknown She reports a maternal great aunt with cancer, type unknown  Her father had colon cancer in his 53s Her paternal aunt had esophageal cancer  Anna Mann is unaware of previous family history of genetic testing for hereditary cancer risks. There is no reported Ashkenazi Jewish ancestry. There is no known consanguinity.  GENETIC COUNSELING ASSESSMENT:  Anna Mann is a 63 y.o. female with a personal history of breast cancer and family history of ovarian cancer and other cancers, which is somewhat suggestive of a hereditary cancer syndrome and predisposition to cancer given this history. We, therefore, discussed and recommended the following at today's visit.   DISCUSSION: We discussed that, in general, most cancer is not inherited in families, but instead is sporadic or familial. Sporadic cancers occur by chance and typically happen at older ages (>50 years) as this type of cancer is caused by genetic changes acquired during an individual's lifetime. Some families have more cancers than would be expected by chance; however, the ages or types of cancer are not consistent with a known genetic mutation or known genetic mutations have been ruled out. This type of familial cancer is thought to be due to a combination of multiple genetic, environmental, hormonal, and lifestyle factors. While this combination of factors likely increases the risk of cancer, the exact source of this risk is not currently identifiable or testable.  We discussed that 5 - 10% of breast cancer is hereditary. Most cases of hereditary breast cancer are associated  with BRCA1 and BRCA2 genes, although there are  other genes associated with hereditary breast cancer as well. Cancer risks and management strategies are gene specific. We discussed that genetic testing can beneficial for several reasons, including clarifying specific cancer risks, identifying potential screening and risk-reduction options that may be appropriate, and to understand if other family members could be at risk for cancer and allow them to undergo genetic testing to clarify their cancer risks.   We reviewed the characteristics, features and inheritance patterns of hereditary cancer syndromes. We also discussed genetic testing, including the appropriate family members to test, the process of testing, insurance coverage and turn-around-time for results. We discussed the implications of a negative, positive, carrier and/or variant of uncertain significant result.   Anna Mann was offered the [facility] + RNAinsight gene panel which includes sequencing, rearrangement analysis, and RNA analysis for the following 40 genes: APC, ATM, BAP1, BARD1, BMPR1A, BRCA1, BRCA2, BRIP1, CDH1, CDKN2A, CHEK2, FH, FLCN, MET, MLH1, MSH2, MSH6, MUTYH, NF1, NTHL1, PALB2, PMS2, PTEN, RAD51C, RAD51D, RPS20, SMAD4, STK11, TP53, TSC1, TSC2, and VHL (sequencing and deletion/duplication); AXIN2, HOXB13, MBD4, MSH3, POLD1 and POLE (sequencing only); EPCAM and GREM1 (deletion/duplication only).   Anna Mann was also offered the [facility]-Expanded + RNAinsight gene panel which includes sequencing, rearrangement, and RNA analysis for the following 77 genes: AIP, ALK, APC, ATM, AXIN2, BAP1, BARD1, BMPR1A, BRCA1, BRCA2, BRIP1, CDC73, CDH1, CDK4, CDKN1B, CDKN2A, CEBPA, CHEK2, CTNNA1, DDX41, DICER1, ETV6, FH, FLCN, GATA2, LZTR1, MAX, MBD4, MEN1, MET, MLH1, MSH2, MSH3, MSH6, MUTYH, NF1, NF2, NTHL1, PALB2, PHOX2B, PMS2, POT1, PRKAR1A, PTCH1, PTEN, RAD51C, RAD51D, RB1, RET, RPS20, RUNX1, SDHA, SDHAF2, SDHB, SDHC, SDHD, SMAD4,  SMARCA4, SMARCB1, SMARCE1, STK11, SUFU, TMEM127, TP53, TSC1, TSC2, VHL, and WT1 (sequencing and deletion/duplication); EGFR, HOXB13, KIT, MITF, PDGFRA, POLD1, and POLE (sequencing only); EPCAM and GREM1 (deletion/duplication only).    Anna Mann was informed of the benefits and limitations of each panel, including that expanded pan-cancer panels contain genes may not have clear management guidelines at this point in time. We also discussed that as the number of genes included on a panel increases, the chances of variants of uncertain significance increases. After considering the risks, benefits, and limitations, Anna Mann provided informed consent to pursue genetic testing. Anna Mann decided to pursue genetic testing for the [facility]- Expanded + RNA gene panel.   Based on Anna Mann's personal and family history of cancer, she meets medical criteria for genetic testing. she meets criteria due to her personal history of cancer and close relative (2nd degree) with ovarian cancer. Despite that she meets criteria, she may still have an out of pocket cost. We discussed that if her out of pocket cost for testing is over $100, the laboratory will call and confirm whether she wants to proceed with testing.  If the out of pocket cost of testing is less than $100 she will be billed by the genetic testing laboratory. Anna Mann filled out the [employer] application and her out of pocket cost was estimated to be $0.  We discussed that some people do not want to undergo genetic testing due to fear of genetic discrimination.  The Genetic Information Nondiscrimination Act (GINA) was signed into federal law in 2008. GINA prohibits health insurers and most employers from discriminating against individuals based on genetic information (including the results of genetic tests and family history information). According to GINA, health insurance companies cannot consider genetic information to be a  preexisting condition, nor can they use it to make decisions regarding coverage or  rates. GINA also makes it illegal for most employers to use genetic information in making decisions about hiring, firing, promotion, or terms of employment. It is important to note that GINA does not offer protections for life insurance, disability insurance, or long-term care insurance. GINA does not apply to those in the [employer], those who work for companies with less than 15 employees, and new life insurance or long-term disability insurance policies.  Health status due to a cancer diagnosis is not protected under GINA. More information about GINA can be found by visiting [email or web address].[email or web address].  PLAN: After considering the risks, benefits, and limitations, Anna Mann provided informed consent to pursue genetic testing and the blood sample was sent to [facility] for analysis of the [facility]- Expanded + RNA 77 gene panel. Results should be available within approximately 2-3 weeks' time, at which point they will be disclosed by telephone to Anna Mann, as will any additional recommendations warranted by these results. Anna Mann will receive a summary of her genetic counseling visit and a copy of her results once available. This information will also be available in Epic.   I also recommended her daughter who's father was diagnosed with colon cancer at age 72 talk to her PCP about starting colonoscopies, as colonoscopies are recommended to start for at age 74 or 10 years earlier than the colorectal cancer diagnosis in individuals with first degree relatives with colorectal cancer.   RESOURCES PROVIDED:  Anna Mann was provided with the following:  [facility] Billing information  [facility] Contact card   Lastly, we encouraged Ms. Gartman to remain in contact with cancer genetics annually so that we can continuously update the family history and inform her of any changes in cancer  genetics and testing that may be of benefit for this family.   Ms. Defranco questions were answered to her satisfaction today. Our contact information was provided should additional questions or concerns arise. Thank you for the referral and allowing us  to share in the care of your patient.   Aarik Blank R. Bluford, MS, [facility] Certified [email or web address] phone: [phone]  I personally spent a total of 65 minutes in the care of the patient today including preparing to see the patient, getting/reviewing separately obtained history, counseling and educating, placing orders, and documenting clinical information in the EHR.  The patient was seen alone.  Drs. Lanny Stalls, and/or Gudena were available for questions, if needed.   _______________________________________________________________________ For Office Staff:  Number of people involved in session: 1 Was an Intern/ student involved with case: no

## 2024-11-03 ENCOUNTER — Ambulatory Visit: Payer: Self-pay | Admitting: Obstetrics and Gynecology

## 2024-11-03 LAB — CYTOLOGY - PAP
Comment: NEGATIVE
Diagnosis: NEGATIVE
High risk HPV: NEGATIVE

## 2024-11-09 ENCOUNTER — Encounter: Payer: Self-pay | Admitting: Hematology and Oncology

## 2024-11-12 ENCOUNTER — Telehealth: Payer: Self-pay

## 2024-11-12 DIAGNOSIS — Z1379 Encounter for other screening for genetic and chromosomal anomalies: Secondary | ICD-10-CM | POA: Insufficient documentation

## 2024-11-12 NOTE — Telephone Encounter (Signed)
 I contacted  Anna Mann to discuss her genetic testing results. No pathogenic variants were identified in the 70 genes analyzed. Discussed that we do not know why she has cancer or why there is cancer in the family. It could be due to a different gene that we are not testing, or maybe our current technology may not be able to pick something up.  It will be important for her to keep in contact with genetics to keep up with whether additional testing may be needed.Detailed clinic note to follow.   The test report will be scanned into EPIC and will be located under the Molecular Pathology section of the Results Review tab.  A portion of the result report is included below for reference.

## 2024-11-14 ENCOUNTER — Ambulatory Visit (HOSPITAL_COMMUNITY): Admission: RE | Admit: 2024-11-14 | Discharge: 2024-11-14 | Attending: Obstetrics and Gynecology

## 2024-11-14 DIAGNOSIS — N840 Polyp of corpus uteri: Secondary | ICD-10-CM

## 2024-11-14 DIAGNOSIS — D219 Benign neoplasm of connective and other soft tissue, unspecified: Secondary | ICD-10-CM

## 2024-11-16 ENCOUNTER — Ambulatory Visit: Payer: Self-pay

## 2024-11-16 DIAGNOSIS — Z803 Family history of malignant neoplasm of breast: Secondary | ICD-10-CM

## 2024-11-16 DIAGNOSIS — Z800 Family history of malignant neoplasm of digestive organs: Secondary | ICD-10-CM

## 2024-11-16 DIAGNOSIS — Z8041 Family history of malignant neoplasm of ovary: Secondary | ICD-10-CM

## 2024-11-16 DIAGNOSIS — D0511 Intraductal carcinoma in situ of right breast: Secondary | ICD-10-CM

## 2024-11-16 DIAGNOSIS — Z1379 Encounter for other screening for genetic and chromosomal anomalies: Secondary | ICD-10-CM

## 2024-11-16 DIAGNOSIS — Z807 Family history of other malignant neoplasms of lymphoid, hematopoietic and related tissues: Secondary | ICD-10-CM

## 2024-11-16 NOTE — Progress Notes (Signed)
 HPI:  Anna Mann was previously seen in the [facility] due to a personal history of breast cancer and concerns regarding a hereditary predisposition to cancer. Please refer to our prior cancer genetics clinic note for more information regarding our discussion, assessment and recommendations, at the time. Anna Mann recent genetic test results were disclosed to her, as were recommendations warranted by these results. These results and recommendations are discussed in more detail below.  CANCER HISTORY:  Oncology History  Ductal carcinoma in situ (DCIS) of right breast  07/30/2024 Initial Diagnosis   Screening mammogram detected right breast calcifications spanning 2.5 cm, biopsy: Focal epithelial atypia with ADH?  DCIS; right lumpectomy: High-grade DCIS with necrosis and calcifications 5 mm, margins negative ER 100%, PR 80%   07/30/2024 Surgery   Rt Lumpectomy: HG DCIS with necrosis 5 mm, Margins Neg, ER 100%, PR 80%   08/13/2024 Cancer Staging   Staging form: Breast, AJCC 8th Edition - Clinical: Stage 0 (cTis (DCIS), cN0, cM0, ER+, PR+, HER2: Not Assessed) - Signed by Odean Potts, MD on 08/13/2024 Stage prefix: Initial diagnosis Nuclear grade: G3   11/08/2024 Genetic Testing   Negative genetic testing. Report date is 11/08/2024.  The CancerNext-Expanded gene panel offered by [facility] and includes sequencing, rearrangement, and RNA analysis for the following 77 genes: AIP, ALK, APC, ATM, BAP1, BARD1, BMPR1A, BRCA1, BRCA2, BRIP1, CDC73, CDH1, CDK4, CDKN1B, CDKN2A, CEBPA, CHEK2, CTNNA1, DDX41, DICER1, ETV6, FH, FLCN, GATA2, LZTR1, MAX, MBD4, MEN1, MET, MLH1, MSH2, MSH3, MSH6, MUTYH, NF1, NF2, NTHL1, PALB2, PHOX2B, PMS2, POT1, PRKAR1A, PTCH1, PTEN, RAD51C, RAD51D, RB1, RET, RPS20, RUNX1, SDHA, SDHAF2, SDHB, SDHC, SDHD, SMAD4, SMARCA4, SMARCB1, SMARCE1, STK11, SUFU, TMEM127, TP53, TSC1, TSC2, VHL, and WT1 (sequencing and deletion/duplication); AXIN2, CTNNA1, DDX41, EGFR,  HOXB13, KIT, MBD4, MITF, MSH3, PDGFRA, POLD1 and POLE (sequencing only); EPCAM and GREM1 (deletion/duplication only). RNA data is routinely analyzed for use in variant interpretation for all genes.      Mann HISTORY:  We obtained a detailed, 4-generation Mann history.  Significant diagnoses are listed below: Mann History  Problem Relation Age of Onset   Aneurysm Mother    Heart disease Mother    Diabetes Mother    Hypertension Mother    Colon cancer Father 47 - 55   Diabetes Sister    Kidney failure Sister    Heart failure Sister    Hodgkin's lymphoma Sister    Diabetes Brother    Heart disease Brother    Congestive Heart Failure Brother    Heart disease Brother    Diabetes Brother    Pancreatitis Brother        potentially pancreatic cancer   Heart failure Brother    Ovarian cancer Maternal Aunt        2021   Colon cancer Maternal Aunt        may have also had breast cancer prior to this colon cancer   Esophageal cancer Paternal Aunt 49 - 79   Diabetes Maternal Grandmother    Hypertension Maternal Grandmother    Congestive Heart Failure Maternal Grandmother    Heart disease Paternal Grandmother    Hypertension Paternal Grandmother    Stroke Paternal Grandfather    Diabetes Daughter        her father had colon cancer at age 16 and died from colon cancer   Other Daughter        pre diabetic   Breast cancer Cousin    Kidney failure Nephew  Diabetes Nephew    Hodgkin's lymphoma Niece 64   Breast cancer Maternal Cousin    Colon polyps Neg Hx    Stomach cancer Neg Hx    Sleep apnea Neg Hx      Anna Mann reports the following Mann history: All three of her children are by different fathers. One of her daughters father was diagnosed with colon cancer at age 49. Her brother had pancreatitis but she reports she thinks this might have been pancreatic cancer but isn't sure as her brother didn't share much of his health information with her.  Her sister had  Hodgkin Lymphoma Her niece had Hodgkin lymphoma diagnosed age 101 Her mother reportedly had her breast removed and she thought it was due to breast cancer but her maternal aunt said it wasn't due to breast cancer, rather done to prevent breast cancer Her maternal aunt had ovarian cancer Her maternal aunt had colon cancer and she thinks she might have had breast cancer before the colon cancer but isn't sure She reports a maternal cousin with breast cancer who died of breast cancer. And another maternal cousin with cancer, type unknown She reports a maternal great aunt with cancer, type unknown   Her father had colon cancer in his 84s Her paternal aunt had esophageal cancer   Anna Mann is unaware of previous Mann history of genetic testing for hereditary cancer risks. There is no reported Ashkenazi Jewish ancestry. There is no known consanguinity.  GENETIC TEST RESULTS: Genetic testing reported out on 11/08/2024 through the [facility]- Expanded cancer panel found no pathogenic mutations.   The [facility] CancerNext-Expanded + RNAinsight gene panel includes sequencing, rearrangement, and RNA analysis for the following 77 genes: AIP, ALK, APC, ATM, AXIN2, BAP1, BARD1, BMPR1A, BRCA1, BRCA2, BRIP1, CDC73, CDH1, CDK4, CDKN1B, CDKN2A, CEBPA, CHEK2, CTNNA1, DDX41, DICER1, ETV6, FH, FLCN, GATA2, LZTR1, MAX, MBD4, MEN1, MET, MLH1, MSH2, MSH3, MSH6, MUTYH, NF1, NF2, NTHL1, PALB2, PHOX2B, PMS2, POT1, PRKAR1A, PTCH1, PTEN, RAD51C, RAD51D, RB1, RET, RPS20, RUNX1, SDHA, SDHAF2, SDHB, SDHC, SDHD, SMAD4, SMARCA4, SMARCB1, SMARCE1, STK11, SUFU, TMEM127, TP53, TSC1, TSC2, VHL, and WT1 (sequencing and deletion/duplication); EGFR, HOXB13, KIT, MITF, PDGFRA, POLD1, and POLE (sequencing only); EPCAM and GREM1 (deletion/duplication only).   The test report has been scanned into EPIC and is located under the Molecular Pathology section of the Results Review tab.  A portion of the result report is included below for  reference.     We discussed with Anna Mann that because current genetic testing is not perfect, it is possible there may be a gene mutation in one of these genes that current testing cannot detect, but that chance is small.  We also discussed, that there could be another gene that has not yet been discovered, or that we have not yet tested, that is responsible for the cancer diagnoses in the Mann. It is also possible there is a hereditary cause for the cancer in the Mann that Anna Mann did not inherit and therefore was not identified in her testing.  Therefore, it is important to remain in touch with cancer genetics in the future so that we can continue to offer Anna Mann the most up to date genetic testing.   ADDITIONAL GENETIC TESTING: We discussed with Anna Mann that her genetic testing was fairly extensive.  If there are genes identified to increase cancer risk that can be analyzed in the future, we would be happy to discuss and coordinate this testing at that time.  CANCER SCREENING RECOMMENDATIONS: Anna Mann test result is considered negative (normal).  This means that we have not identified a hereditary cause for her personal history of breast cancer at this time. Most cancers happen by chance and this negative test suggests that her personal history of breast cancer may fall into this category.    Possible reasons for Anna Mann's negative genetic test include:  1. There may be a gene mutation in one of these genes that current testing methods cannot detect but that chance is small.  2. There could be another gene that has not yet been discovered, or that we have not yet tested, that is responsible for the cancer diagnoses in the Mann.  3.  There may be no hereditary risk for cancer in the Mann. The cancers in Anna Mann may be sporadic/familial or due to other genetic and environmental factors. 4. It is also possible there is a hereditary cause for the cancer  in the Mann that Anna Mann did not inherit.  Therefore, it is recommended she continue to follow the cancer management and screening guidelines provided by her oncology and primary healthcare provider. An individual's cancer risk and medical management are not determined by genetic test results alone. Overall cancer risk assessment incorporates additional factors, including personal medical history, Mann history, and any available genetic information that may result in a personalized plan for cancer prevention and surveillance  An individual's cancer risk and medical management are not determined by genetic test results alone. Overall cancer risk assessment incorporates additional factors, including personal medical history, Mann history, and any available genetic information that may result in a personalized plan for cancer prevention and surveillance.  RECOMMENDATIONS FOR Mann MEMBERS:   Since she did not inherit a identifiable mutation in a cancer predisposition gene included on this panel, her children could not have inherited a known mutation from her in one of these genes. Individuals in this Mann might be at some increased risk of developing cancer, over the general population risk, simply due to the Mann history of cancer.  We recommended women in this Mann have a yearly mammogram beginning at age 46, or 10 years younger than the earliest onset of cancer, an annual clinical breast exam, and perform monthly breast self-exams. Women in this Mann should also have a gynecological exam as recommended by their primary provider. All Mann members should be referred for colonoscopy starting at age 22, or 72 years younger than the earliest onset of cancer.  FOLLOW-UP: Lastly, we discussed with Anna Mann that cancer genetics is a rapidly advancing field and it is possible that new genetic tests will be appropriate for her and/or her Mann members in the future. We encouraged her to remain  in contact with cancer genetics on an annual basis so we can update her personal and Mann histories and let her know of advances in cancer genetics that may benefit this Mann.   Our contact number was provided. Anna Mann questions were answered to her satisfaction, and she knows she is welcome to call us  at anytime with additional questions or concerns.   Warren Ahle, MS, [facility] Cancer Genetic Counselor [email or web address] [phone]

## 2024-11-24 ENCOUNTER — Ambulatory Visit: Payer: Self-pay | Admitting: Emergency Medicine

## 2024-11-24 ENCOUNTER — Ambulatory Visit: Admitting: Emergency Medicine

## 2024-11-24 ENCOUNTER — Telehealth: Payer: Self-pay

## 2024-11-24 ENCOUNTER — Other Ambulatory Visit (HOSPITAL_COMMUNITY): Payer: Self-pay

## 2024-11-24 ENCOUNTER — Encounter: Payer: Self-pay | Admitting: Emergency Medicine

## 2024-11-24 VITALS — BP 126/82 | HR 63 | Temp 98.2°F | Ht 63.0 in | Wt 245.0 lb

## 2024-11-24 DIAGNOSIS — I152 Hypertension secondary to endocrine disorders: Secondary | ICD-10-CM

## 2024-11-24 DIAGNOSIS — E1169 Type 2 diabetes mellitus with other specified complication: Secondary | ICD-10-CM

## 2024-11-24 LAB — CBC WITH DIFFERENTIAL/PLATELET
Basophils Absolute: 0 K/uL (ref 0.0–0.1)
Basophils Relative: 0.8 % (ref 0.0–3.0)
Eosinophils Absolute: 0.2 K/uL (ref 0.0–0.7)
Eosinophils Relative: 3 % (ref 0.0–5.0)
HCT: 40.5 % (ref 36.0–46.0)
Hemoglobin: 13.3 g/dL (ref 12.0–15.0)
Lymphocytes Relative: 33.2 % (ref 12.0–46.0)
Lymphs Abs: 1.8 K/uL (ref 0.7–4.0)
MCHC: 33 g/dL (ref 30.0–36.0)
MCV: 88.1 fl (ref 78.0–100.0)
Monocytes Absolute: 0.5 K/uL (ref 0.1–1.0)
Monocytes Relative: 8.8 % (ref 3.0–12.0)
Neutro Abs: 3 K/uL (ref 1.4–7.7)
Neutrophils Relative %: 54.2 % (ref 43.0–77.0)
Platelets: 184 K/uL (ref 150.0–400.0)
RBC: 4.6 Mil/uL (ref 3.87–5.11)
RDW: 14.4 % (ref 11.5–15.5)
WBC: 5.5 K/uL (ref 4.0–10.5)

## 2024-11-24 LAB — POCT GLYCOSYLATED HEMOGLOBIN (HGB A1C): HbA1c POC (<> result, manual entry): 6.9 % (ref 4.0–5.6)

## 2024-11-24 LAB — COMPREHENSIVE METABOLIC PANEL WITH GFR
ALT: 14 U/L (ref 0–35)
AST: 16 U/L (ref 0–37)
Albumin: 4.1 g/dL (ref 3.5–5.2)
Alkaline Phosphatase: 58 U/L (ref 39–117)
BUN: 13 mg/dL (ref 6–23)
CO2: 28 meq/L (ref 19–32)
Calcium: 9.6 mg/dL (ref 8.4–10.5)
Chloride: 109 meq/L (ref 96–112)
Creatinine, Ser: 0.72 mg/dL (ref 0.40–1.20)
GFR: 88.88 mL/min (ref >60.00)
Glucose, Bld: 147 mg/dL — ABNORMAL HIGH (ref 70–99)
Potassium: 4.1 meq/L (ref 3.5–5.1)
Sodium: 144 meq/L (ref 135–145)
Total Bilirubin: 0.4 mg/dL (ref 0.2–1.2)
Total Protein: 6.9 g/dL (ref 6.0–8.3)

## 2024-11-24 LAB — LIPID PANEL
Cholesterol: 108 mg/dL (ref 0–200)
HDL: 31 mg/dL — ABNORMAL LOW (ref >39.00)
LDL Cholesterol: 59 mg/dL (ref 0–99)
NonHDL: 77.08
Total CHOL/HDL Ratio: 3
Triglycerides: 90 mg/dL (ref 0.0–149.0)
VLDL: 18 mg/dL (ref 0.0–40.0)

## 2024-11-24 MED ORDER — RYBELSUS 3 MG PO TABS: 3.0000 mg | ORAL_TABLET | Freq: Every day | ORAL | 3 refills | Status: DC

## 2024-11-24 NOTE — Telephone Encounter (Signed)
 Pharmacy Patient Advocate Encounter   Received notification from Onbase that prior authorization for Rybelsus  3 tabs is required/requested.   Insurance verification completed.   The patient is insured through [facility] MEDICAID.   Per test claim: PA required; PA submitted to above mentioned insurance via Latent Key/confirmation #/[id number] [facility] Status is pending

## 2024-11-24 NOTE — Progress Notes (Signed)
 Anna Mann 63 y.o.   Chief Complaint  Patient presents with   Follow-up    6 months pt states that she constantly eating, and she has been craving a lot of sweets more than usual pt also mentioned that when she would have a bowel movement it would be pebbles pt believes that she may have a parasite     HISTORY OF PRESENT ILLNESS: This is a 63 y.o. female complaining of overeating and cravings for sugar No other complaints or medical concerns today. Lab Results  Component Value Date   HGBA1C 6.6 (H) 07/25/2024   Wt Readings from Last 3 Encounters:  10/28/24 244 lb 3.2 oz (110.8 kg)  10/01/24 246 lb 14.6 oz (112 kg)  09/17/24 246 lb 14.4 oz (112 kg)      HPI   Prior to Admission medications  Medication Sig Start Date End Date Taking? Authorizing Provider  anastrozole  (ARIMIDEX ) 1 MG tablet Take 1 tablet (1 mg total) by mouth daily. 09/17/24  Yes Odean Potts, MD  atorvastatin  (LIPITOR) 20 MG tablet Take 1 tablet (20 mg total) by mouth daily. 06/03/24  Yes Kashden Deboy, Emil Schanz, MD  empagliflozin  (JARDIANCE ) 25 MG TABS tablet Take 1 tablet (25 mg total) by mouth daily. 06/03/24  Yes Roberto Hlavaty, Emil Schanz, MD  fluconazole  (DIFLUCAN ) 100 MG tablet 2 tablets by mouth the first day, then 1 tablet daily for 6 more days. 10/01/24  Yes Vonna Sharlet POUR, MD  glucose blood (TRUE METRIX BLOOD GLUCOSE TEST) test strip Use as instructed. Check blood glucose level by fingerstick twice per day.  E11.65 11/22/19  Yes Theotis Haze ORN, NP  lisinopril  (ZESTRIL ) 20 MG tablet Take 1 tablet (20 mg total) by mouth daily. 06/03/24  Yes Loredana Medellin Jose, MD  nystatin  (MYCOSTATIN /NYSTOP ) powder Apply 1 Application topically 3 (three) times daily. For 2-3 weeks 10/01/24  Yes Banister, Sharlet POUR, MD  ondansetron  (ZOFRAN -ODT) 4 MG disintegrating tablet Dissolve 1 tablet (4 mg total) by mouth every 8 (eight) hours as needed for nausea or vomiting. 03/08/22  Yes Armbruster, Elspeth SQUIBB, MD  TRUEplus Lancets  28G MISC Use as instructed. Check blood glucose level by fingerstick twice per day. 11/22/19  Yes Theotis Haze ORN, NP  omeprazole  (PRILOSEC) 20 MG capsule Take 1 capsule (20 mg total) by mouth 2 (two) times daily before a meal. Patient not taking: Reported on 11/24/2024 09/15/24 12/14/24  Purcell Emil Schanz, MD    Allergies[1]  Patient Active Problem List   Diagnosis Date Noted   Genetic testing 11/12/2024   Family history of ovarian cancer    Family history of Hodgkin lymphoma    Family history of colon cancer    Family history of breast cancer    Chronic pain of left knee 09/15/2024   Bacterial vaginosis 09/15/2024   Ductal carcinoma in situ (DCIS) of right breast 08/13/2024   Vaginal discharge 07/22/2024   Suspected sleep apnea 07/22/2024   Hypertension associated with diabetes (HCC) 01/03/2022   Dyslipidemia associated with type 2 diabetes mellitus (HCC) 01/03/2022   Uncontrolled type 2 diabetes mellitus with hyperglycemia (HCC) 03/11/2021   Fibroids 12/18/2019    Past Medical History:  Diagnosis Date   Abnormal Pap smear    colposcopy   Abnormal uterine bleeding (AUB)    Allergy    Diabetes mellitus without complication (HCC)    Family history of breast cancer    Family history of colon cancer    Family history of colon cancer    Family history  of Hodgkin lymphoma    Family history of ovarian cancer    GERD (gastroesophageal reflux disease)    Heart murmur    Hyperlipidemia    Hypertension    Vertigo    hx    Past Surgical History:  Procedure Laterality Date   BREAST BIOPSY Right 07/10/2024   MM RT BREAST BX W LOC DEV 1ST LESION IMAGE BX SPEC STEREO GUIDE 07/10/2024 [facility]   BREAST BIOPSY  07/29/2024   MM RT RADIOACTIVE SEED LOC MAMMO GUIDE 07/29/2024 [facility]   COLONOSCOPY  2023   COLPOSCOPY     DILATATION & CURETTAGE/HYSTEROSCOPY WITH MYOSURE N/A 05/25/2021   Procedure: DILATATION & CURETTAGE/HYSTEROSCOPY WITH MYOSURE;  Surgeon: Storm Setter, DO;  Location: [facility];  Service: Gynecology;  Laterality: N/A;   DILATION AND CURETTAGE OF UTERUS     KNEE ARTHROSCOPY W/ ACL RECONSTRUCTION Left    LAPAROSCOPY N/A 05/25/2021   Procedure: LAPAROSCOPY DIAGNOSTIC,;  Surgeon: Storm Setter, DO;  Location:  SURGERY CENTER;  Service: Gynecology;  Laterality: N/A;   TUBAL LIGATION     UPPER GI ENDOSCOPY  2023    Social History   Socioeconomic History   Marital status: Single    Spouse name: Not on file   Number of children: 3   Years of education: Not on file   Highest education level: Associate degree: occupational, scientist, product/process development, or vocational program  Occupational History   Occupation: Psychologist, sport and exercise rep  Tobacco Use   Smoking status: Never    Passive exposure: Never   Smokeless tobacco: Never  Vaping Use   Vaping status: Never Used  Substance and Sexual Activity   Alcohol use: No   Drug use: No   Sexual activity: Not Currently    Birth control/protection: Post-menopausal  Other Topics Concern   Not on file  Social History Narrative   Pt doesn't work    Pt lives alone    Social Drivers of Health   Tobacco Use: Low Risk (11/24/2024)   Patient History    Smoking Tobacco Use: Never    Smokeless Tobacco Use: Never    Passive Exposure: Never  Financial Resource Strain: Low Risk (11/20/2024)   Overall Financial Resource Strain (CARDIA)    Difficulty of Paying Living Expenses: Not hard at all  Food Insecurity: No Food Insecurity (11/20/2024)   Epic    Worried About Programme Researcher, Broadcasting/film/video in the Last Year: Never true    Ran Out of Food in the Last Year: Never true  Transportation Needs: Unknown (11/20/2024)   Epic    Lack of Transportation (Medical): No    Lack of Transportation (Non-Medical): Patient declined  Physical Activity: Unknown (11/20/2024)   Exercise Vital Sign    Days of Exercise per Week: Patient declined    Minutes of Exercise per Session: Not on file  Stress: No Stress  Concern Present (11/20/2024)   [employer] of Occupational Health - Occupational Stress Questionnaire    Feeling of Stress: Not at all  Social Connections: Moderately Integrated (11/20/2024)   Social Connection and Isolation Panel    Frequency of Communication with Friends and Family: More than three times a week    Frequency of Social Gatherings with Friends and Family: More than three times a week    Attends Religious Services: More than 4 times per year    Active Member of [employer] or Organizations: Yes    Attends Banker Meetings: More than 4 times per year  Marital Status: Divorced  Catering Manager Violence: Not At Risk (08/13/2024)   Epic    Fear of Current or Ex-Partner: No    Emotionally Abused: No    Physically Abused: No    Sexually Abused: No  Depression (PHQ2-9): Medium Risk (10/28/2024)   Depression (PHQ2-9)    PHQ-2 Score: 10  Alcohol Screen: Not on file  Housing: Low Risk (11/20/2024)   Epic    Unable to Pay for Housing in the Last Year: No    Number of Times Moved in the Last Year: 0    Homeless in the Last Year: No  Utilities: Not At Risk (08/20/2024)   Epic    Threatened with loss of utilities: No  Health Literacy: Not on file    Family History  Problem Relation Age of Onset   Aneurysm Mother    Heart disease Mother    Diabetes Mother    Hypertension Mother    Colon cancer Father 53 - 1   Diabetes Sister    Kidney failure Sister    Heart failure Sister    Hodgkin's lymphoma Sister    Diabetes Brother    Heart disease Brother    Congestive Heart Failure Brother    Heart disease Brother    Diabetes Brother    Pancreatitis Brother        potentially pancreatic cancer   Heart failure Brother    Ovarian cancer Maternal Aunt        2021   Colon cancer Maternal Aunt        may have also had breast cancer prior to this colon cancer   Esophageal cancer Paternal Aunt 19 - 79   Diabetes Maternal Grandmother    Hypertension Maternal  Grandmother    Congestive Heart Failure Maternal Grandmother    Heart disease Paternal Grandmother    Hypertension Paternal Grandmother    Stroke Paternal Grandfather    Diabetes Daughter        her father had colon cancer at age 79 and died from colon cancer   Other Daughter        pre diabetic   Breast cancer Cousin    Kidney failure Nephew    Diabetes Nephew    Hodgkin's lymphoma Niece 15   Breast cancer Maternal Cousin    Colon polyps Neg Hx    Stomach cancer Neg Hx    Sleep apnea Neg Hx      Review of Systems  Constitutional: Negative.  Negative for chills and fever.  HENT: Negative.  Negative for congestion and sore throat.   Respiratory: Negative.  Negative for cough and shortness of breath.   Cardiovascular: Negative.  Negative for chest pain and palpitations.  Gastrointestinal:  Negative for abdominal pain, nausea and vomiting.  Genitourinary: Negative.  Negative for dysuria and hematuria.  Skin: Negative.  Negative for rash.  Neurological: Negative.  Negative for dizziness and headaches.  All other systems reviewed and are negative.   Today's Vitals   11/24/24 0850  BP: 126/82  Pulse: 63  Temp: 98.2 F (36.8 C)  TempSrc: Oral  SpO2: 97%  Weight: 245 lb (111.1 kg)  Height: 5' 3 (1.6 m)   Body mass index is 43.4 kg/m.   Physical Exam Vitals reviewed.  Constitutional:      Appearance: Normal appearance. She is obese.  HENT:     Head: Normocephalic.  Eyes:     Extraocular Movements: Extraocular movements intact.  Cardiovascular:     Rate and  Rhythm: Normal rate and regular rhythm.     Pulses: Normal pulses.     Heart sounds: Normal heart sounds.  Pulmonary:     Effort: Pulmonary effort is normal.     Breath sounds: Normal breath sounds.  Skin:    General: Skin is warm and dry.     Capillary Refill: Capillary refill takes less than 2 seconds.  Neurological:     General: No focal deficit present.     Mental Status: She is alert and oriented to  person, place, and time.  Psychiatric:        Mood and Affect: Mood normal.        Behavior: Behavior normal.    Results for orders placed or performed in visit on 11/24/24 (from the past 24 hours)  POCT HgB A1C     Status: Abnormal   Collection Time: 11/24/24  9:15 AM  Result Value Ref Range   Hemoglobin A1C     HbA1c POC (<> result, manual entry) 6.9 4.0 - 5.6 %   HbA1c, POC (prediabetic range)     HbA1c, POC (controlled diabetic range)        ASSESSMENT & PLAN: A total of 40 minutes was spent with the patient and counseling/coordination of care regarding preparing for this visit, review of most recent office visit notes, review of multiple chronic medical conditions and their management, cardiovascular risks associated with hypertension and diabetes, review of all medications and changes made, review of most recent bloodwork results including interpretation of today's hemoglobin A1c, review of health maintenance items, education on nutrition, prognosis, documentation, and need for follow up.  Problem List Items Addressed This Visit       Cardiovascular and Mediastinum   Hypertension associated with diabetes (HCC) - Primary   BP Readings from Last 3 Encounters:  11/24/24 126/82  10/28/24 126/81  10/01/24 130/74   Lab Results  Component Value Date   HGBA1C 6.9 11/24/2024  Well-controlled hypertension Continue lisinopril  20 mg daily Well-controlled diabetes with hemoglobin A1c of 6.9 Continue Jardiance  25 mg daily and restart Rybelsus  3 mg daily No longer taking glipizide .  Had problems with GI side effects from GLP-1 agonist in the past but wants to retry this medication again.  It controls her appetite and helps her lose weight. Cardiovascular risks associated with hypertension and diabetes discussed Diet and nutrition discussed Blood work and urine done today Recommend follow-up in 6 months       Relevant Medications   Semaglutide  (RYBELSUS ) 3 MG TABS   Other  Relevant Orders   POCT HgB A1C (Completed)   Comprehensive metabolic panel with GFR   Lipid panel   CBC with Differential/Platelet     Endocrine   Dyslipidemia associated with type 2 diabetes mellitus (HCC)   Chronic stable condition.  Well-controlled.  Hemoglobin A1c 6.9 Continue Jardiance  and restart Rybelsus  Continue atorvastatin  20 mg daily. Blood work done today Follow-up in 6 months      Relevant Medications   Semaglutide  (RYBELSUS ) 3 MG TABS   Other Relevant Orders   Comprehensive metabolic panel with GFR   Lipid panel   CBC with Differential/Platelet   Patient Instructions  Diabetes: Carbohydrate Counting for Adults Carbohydrate counting is a method of keeping track of how many carbohydrates you eat. Eating carbohydrates increases the amount of sugar, also called glucose, in your blood. By counting how many carbohydrates you eat, you can improve how well you manage your blood sugar. This, in turn, helps you manage your  diabetes. Carbohydrates are measured in grams (g) per serving. It's important to know how many carbohydrates (in grams or by serving size) you can have in each meal. This is different for every person. A dietitian can help you make a meal plan and calculate how many carbohydrates you should have at each meal and snack. What foods contain carbohydrates? Carbohydrates are found in these foods: Grains, such as breads and cereals. Dried beans and soy products. Starchy vegetables, such as potatoes, peas, and corn. Fruit and fruit juices. Milk and yogurt. Sweets and snack foods like cake, cookies, candy, chips, and soft drinks. How do I count carbohydrates in foods? There are two ways to count carbohydrates in food. You can read food labels or learn standard serving sizes of foods. You can use either of these methods or a combination of both. Using the Nutrition Facts label The Nutrition Facts list is included on the labels of almost all packaged foods and drinks  in the [place] It includes: The serving size. Information about nutrients in each serving. This includes the grams of carbohydrate per serving. To use the Nutrition Facts, decide how many servings you will have. Then, multiply the number of servings by the number of carbohydrates per serving. The resulting number is the total grams of carbohydrates that you'll be having. Learning the standard serving sizes of foods When you eat carbohydrate foods that aren't packaged or don't include Nutrition Facts on the label, you need to measure the servings in order to count the grams of carbohydrates. Measure the foods that you'll eat with a food scale or measuring cup, if needed. Decide how many standard-size servings you'll eat. Multiply the number of servings by 15. For foods that contain carbohydrates, one serving equals 15 g of carbohydrates. For example, if you eat 2 cups or 10 oz (300 g) of strawberries, you'll have eaten 2 servings and 30 g of carbohydrates (2 servings x 15 g = 30 g). For foods that have more than one food mixed, such as soups and casseroles, you must count the carbohydrates in each food that's included. Here's a list of standard serving sizes for common carbohydrate-rich foods. Each of these servings has about 15 g of carbohydrates: 1 slice of bread. 1 six-inch (15 cm) tortilla. ? cup or 2 oz (53 g) of cooked rice or pasta.  cup or 3 oz (85 g) of cooked or canned, drained, and rinsed beans or lentils.  cup or 3 oz (85 g) of a starchy vegetable, such as peas, corn, or squash.  cup or 4 oz (120 g) of hot cereal.  cup or 3 oz (85 g) of boiled or mashed potatoes, or  or 3 oz (85 g) of a large baked potato.  cup or 4 fl oz (118 mL) of fruit juice. 1 cup or 8 fl oz (237 mL) of milk. 1 small or 4 oz (106 g) apple.  or 2 oz (63 g) of a medium banana. 1 cup or 5 oz (150 g) of strawberries. 3 cups or 1 oz (28.3 g) of popped popcorn. What is an example of carbohydrate counting? To  calculate the grams of carbohydrates in this sample meal, follow the steps below. Sample meal 3 oz (85 g) chicken breast. ? cup or 4 oz (106 g) of brown rice.  cup or 3 oz (85 g) of corn. 1 cup or 8 fl oz (237 mL) of milk. 1 cup or 5 oz (150 g) of strawberries with sugar-free whipped topping.  Carbohydrate calculation Identify the foods that have carbohydrates: Rice. Corn. Milk. Strawberries. Calculate how many servings you have of each food: 2 servings of rice. 1 serving of corn. 1 serving of milk. 1 serving of strawberries. Multiply each number of servings by 15 g: 2 servings of rice x 15 g = 30 g. 1 serving of corn x 15 g = 15 g. 1 serving of milk x 15 g = 15 g. 1 serving of strawberries x 15 g = 15 g. Add together all of the amounts to find the total grams of carbohydrates eaten: 30 g + 15 g + 15 g + 15 g = 75 g of carbohydrates total. Where to find more information To learn more, go to: American Diabetes Association at diabetes.org. Click Search and type carb counting. Find the link you need. Centers for Disease Control and Prevention at tonerpromos.no. Click Search and type diabetes. Find the link you need. Academy of Nutrition and Dietetics: eatright.org This information is not intended to replace advice given to you by your health care provider. Make sure you discuss any questions you have with your health care provider. Document Revised: 11/14/2023 Document Reviewed: 11/14/2023 Elsevier Patient Education  2025 Elsevier Inc.        Emil Schaumann, MD [facility]     [1]  Allergies Allergen Reactions   Iodine Cough, Hives, Shortness Of Breath, Swelling and Anaphylaxis   Ivp Dye [Iodinated Contrast Media] Anaphylaxis, Hives, Itching and Swelling   Shellfish Allergy Anaphylaxis, Hives and Itching

## 2024-11-24 NOTE — Assessment & Plan Note (Signed)
 Chronic stable condition.  Well-controlled.  Hemoglobin A1c 6.9 Continue Jardiance  and restart Rybelsus  Continue atorvastatin  20 mg daily. Blood work done today Follow-up in 6 months

## 2024-11-24 NOTE — Assessment & Plan Note (Signed)
 BP Readings from Last 3 Encounters:  11/24/24 126/82  10/28/24 126/81  10/01/24 130/74   Lab Results  Component Value Date   HGBA1C 6.9 11/24/2024  Well-controlled hypertension Continue lisinopril  20 mg daily Well-controlled diabetes with hemoglobin A1c of 6.9 Continue Jardiance  25 mg daily and restart Rybelsus  3 mg daily No longer taking glipizide .  Had problems with GI side effects from GLP-1 agonist in the past but wants to retry this medication again.  It controls her appetite and helps her lose weight. Cardiovascular risks associated with hypertension and diabetes discussed Diet and nutrition discussed Blood work and urine done today Recommend follow-up in 6 months

## 2024-11-24 NOTE — Patient Instructions (Signed)

## 2024-11-25 ENCOUNTER — Other Ambulatory Visit: Payer: Self-pay | Admitting: Emergency Medicine

## 2024-11-25 ENCOUNTER — Other Ambulatory Visit (HOSPITAL_COMMUNITY): Payer: Self-pay

## 2024-11-25 DIAGNOSIS — I152 Hypertension secondary to endocrine disorders: Secondary | ICD-10-CM

## 2024-11-25 DIAGNOSIS — E1169 Type 2 diabetes mellitus with other specified complication: Secondary | ICD-10-CM

## 2024-11-26 ENCOUNTER — Other Ambulatory Visit: Payer: Self-pay | Admitting: Emergency Medicine

## 2024-11-26 ENCOUNTER — Other Ambulatory Visit (HOSPITAL_COMMUNITY): Payer: Self-pay

## 2024-11-26 DIAGNOSIS — E1159 Type 2 diabetes mellitus with other circulatory complications: Secondary | ICD-10-CM

## 2024-11-26 DIAGNOSIS — E1169 Type 2 diabetes mellitus with other specified complication: Secondary | ICD-10-CM

## 2024-11-26 MED ORDER — OZEMPIC (0.25 OR 0.5 MG/DOSE) 2 MG/3ML ~~LOC~~ SOPN: 0.2500 mg | PEN_INJECTOR | SUBCUTANEOUS | 2 refills | Status: AC

## 2024-11-26 NOTE — Telephone Encounter (Signed)
 Pharmacy Patient Advocate Encounter  Received notification from [facility] MEDICAID that Prior Authorization for Rybelsus  3 tabs  has been DENIED.  A Full denial letter HAS BEEN  uploaded to the media tab.   PA #/Case ID/Reference #:[id number]

## 2024-11-26 NOTE — Telephone Encounter (Signed)
 Pharmacy Patient Advocate Encounter  Received notification from [facility] [employer] MED[employer] that Prior Authorization for Rybelsus  3mg  tabs has been DENIED.  See denial reason below. No denial letter attached in CMM. Will attach denial letter to Media tab once received.   PA #/Case ID/Reference #: [id number]

## 2024-11-26 NOTE — Telephone Encounter (Signed)
 New prescription for Ozempic sent to pharmacy of record today.

## 2024-11-26 NOTE — Telephone Encounter (Signed)
 Can we send one the options in for patient to try down below?

## 2024-11-27 ENCOUNTER — Telehealth: Payer: Self-pay

## 2024-11-27 MED ORDER — LANCET DEVICE MISC: 1.0000 | 0 refills | Status: AC

## 2024-11-27 NOTE — Telephone Encounter (Signed)
 Pharmacy Patient Advocate Encounter   Received notification from Physician's Office that prior authorization for Ozempic  (0.25 or 0.5 MG/DOSE) 2MG /3ML pen-injectors is required/requested.   Insurance verification completed.   The patient is insured through [facility] MEDICAID.   Per test claim: PA required; PA submitted to above mentioned insurance via Latent Key/confirmation #/EOC [id number] Status is pending

## 2024-11-28 NOTE — Telephone Encounter (Signed)
 Pa has been submitted for ozempic 

## 2024-11-28 NOTE — Telephone Encounter (Signed)
 Pharmacy Patient Advocate Encounter  Received notification from [facility] MEDICAID that Prior Authorization for Ozempic  2mg /55ml has been APPROVED from 11/27/24 to 11/27/25   PA #/Case ID/Reference #: [id number]

## 2024-12-03 ENCOUNTER — Ambulatory Visit: Admitting: Emergency Medicine

## 2024-12-03 ENCOUNTER — Telehealth: Payer: Self-pay | Admitting: Licensed Clinical Social Worker

## 2024-12-03 NOTE — Telephone Encounter (Signed)
 Left VM for pt informing her that the check from the [employer] grant is available for her to pick up and what times she may do so.   Ashana Tullo E Shadaya Marschner, LCSW

## 2024-12-07 ENCOUNTER — Encounter: Payer: Self-pay | Admitting: Emergency Medicine

## 2024-12-08 MED ORDER — LANCETS MISC: 1.0000 | 0 refills | Status: AC

## 2024-12-08 MED ORDER — BLOOD GLUCOSE TEST VI STRP: 1.0000 | ORAL_STRIP | 0 refills | Status: AC

## 2024-12-08 MED ORDER — LANCET DEVICE MISC: 1.0000 | 0 refills | Status: AC

## 2024-12-08 MED ORDER — LANCET DEVICE WITH EJECTOR MISC: 0 refills | Status: AC

## 2024-12-08 MED ORDER — GLUCOSE BLOOD VI STRP: ORAL_STRIP | 12 refills | Status: AC

## 2024-12-08 MED ORDER — BLOOD GLUCOSE MONITORING SUPPL DEVI: 1.0000 | 0 refills | Status: AC

## 2024-12-08 NOTE — Addendum Note (Signed)
 Addended by: ROSALVA LEX RAMAN on: 12/08/2024 03:26 PM   Modules accepted: Orders

## 2024-12-08 NOTE — Addendum Note (Signed)
 Addended by: ROSALVA LEX RAMAN on: 12/08/2024 09:00 AM   Modules accepted: Orders

## 2024-12-18 ENCOUNTER — Other Ambulatory Visit: Payer: Self-pay

## 2024-12-18 ENCOUNTER — Ambulatory Visit: Admitting: Obstetrics and Gynecology

## 2024-12-18 ENCOUNTER — Ambulatory Visit

## 2024-12-18 VITALS — BP 120/59 | HR 71 | Wt 247.0 lb

## 2024-12-18 DIAGNOSIS — L304 Erythema intertrigo: Secondary | ICD-10-CM | POA: Diagnosis not present

## 2024-12-18 DIAGNOSIS — R102 Pelvic and perineal pain unspecified side: Secondary | ICD-10-CM

## 2024-12-18 MED ORDER — NYSTATIN 100000 UNIT/GM EX CREA: 1.0000 | TOPICAL_CREAM | Freq: Two times a day (BID) | CUTANEOUS | 1 refills | Status: AC

## 2024-12-18 NOTE — Progress Notes (Unsigned)
" ° ° °  GYNECOLOGY VISIT  Patient name: Anna Mann MRN [id number]  Date of birth: 1961-04-29 Chief Complaint:   Follow-up  PT scheduled for feb. Pelvic US  was negative. Idsomfor improving. Havin gbulge in naval area; last BM Monday an dtaking dulcolax stool softner. Use cystatin an dwondering if can use cream istead.  History:  Discussed the use of AI scribe software for clinical note transcription with the patient, who gave verbal consent to proceed.  History of Present Illness      The following portions of the patient's history were reviewed and updated as appropriate: allergies, current medications, past family history, past medical history, past social history, past surgical history and problem list.   Health Maintenance:   Last pap     Component Value Date/Time   DIAGPAP  10/28/2024 1654    - Negative for intraepithelial lesion or malignancy (NILM)   DIAGPAP  05/21/2017 0000    NEGATIVE FOR INTRAEPITHELIAL LESIONS OR MALIGNANCY.   HPVHIGH Negative 10/28/2024 1654   ADEQPAP  10/28/2024 1654    Satisfactory for evaluation; transformation zone component PRESENT.   ADEQPAP  05/21/2017 0000    Satisfactory for evaluation  endocervical/transformation zone component PRESENT.    Health Maintenance  Topic Date Due   Complete foot exam   Never done   Eye exam for diabetics  Never done   Zoster (Shingles) Vaccine (1 of 2) Never done   COVID-19 Vaccine (2 - Moderna risk series) 05/15/2020   Pneumococcal Vaccine for age over 57 (2 of 2 - PCV) 10/02/2020   Stool Blood Test  05/03/2023   Flu Shot  03/10/2025*   Hemoglobin A1C  05/25/2025   Yearly kidney health urinalysis for diabetes  06/03/2025   Yearly kidney function blood test for diabetes  11/24/2025   Breast Cancer Screening  07/08/2026   Osteoporosis screening with Bone Density Scan  10/20/2026   Pap with HPV screening  10/28/2029   DTaP/Tdap/Td vaccine (2 - Td or Tdap) 08/03/2032   Hepatitis C Screening   Completed   HIV Screening  Completed   Hepatitis B Vaccine  Aged Out   HPV Vaccine  Aged Out   Meningitis B Vaccine  Aged Out   Colon Cancer Screening  Discontinued  *Topic was postponed. The date shown is not the original due date.      Review of Systems:  {Ros - complete:30496} Comprehensive review of systems was otherwise negative.   Objective:  Physical Exam BP (!) 120/59   Pulse 71   Wt 247 lb (112 kg)   LMP  (LMP Unknown) Comment: irregular  BMI 43.75 kg/m    Physical Exam   Labs and Imaging No results found.     Assessment & Plan:  Assessment and Plan Assessment & Plan        *** Routine preventative health maintenance measures emphasized.  Carter Quarry, MD [facility], [facility] "

## 2024-12-19 ENCOUNTER — Inpatient Hospital Stay: Attending: Hematology and Oncology | Admitting: Adult Health

## 2024-12-19 ENCOUNTER — Encounter: Payer: Self-pay | Admitting: Adult Health

## 2024-12-19 VITALS — BP 120/56 | HR 64 | Temp 97.7°F | Resp 16 | Wt 247.0 lb

## 2024-12-19 DIAGNOSIS — D0511 Intraductal carcinoma in situ of right breast: Secondary | ICD-10-CM

## 2024-12-19 NOTE — Progress Notes (Signed)
 SURVIVORSHIP VISIT:  BRIEF ONCOLOGIC HISTORY:  Oncology History  Ductal carcinoma in situ (DCIS) of right breast  07/30/2024 Initial Diagnosis   Screening mammogram detected right breast calcifications spanning 2.5 cm, biopsy: Focal epithelial atypia with ADH?  DCIS; right lumpectomy: High-grade DCIS with necrosis and calcifications 5 mm, margins negative ER 100%, PR 80%   07/30/2024 Surgery   Rt Lumpectomy: HG DCIS with necrosis 5 mm, Margins Neg, ER 100%, PR 80%   08/13/2024 Cancer Staging   Staging form: Breast, AJCC 8th Edition - Clinical: Stage 0 (cTis (DCIS), cN0, cM0, ER+, PR+, HER2: Not Assessed) - Signed by Odean Potts, MD on 08/13/2024 Stage prefix: Initial diagnosis Nuclear grade: G3   09/01/2024 - 09/19/2024 Radiation Therapy   Plan Name: Breast_R Site: Breast, Right Technique: 3D Mode: Photon Dose Per Fraction: 2.67 Gy Prescribed Dose (Delivered / Prescribed): 40.05 Gy / 40.05 Gy Prescribed Fxs (Delivered / Prescribed): 15 / 15   09/2024 -  Anti-estrogen oral therapy   Anastrozole    11/08/2024 Genetic Testing   Negative genetic testing. Report date is 11/08/2024.  The CancerNext-Expanded gene panel offered by [facility] and includes sequencing, rearrangement, and RNA analysis for the following 77 genes: AIP, ALK, APC, ATM, BAP1, BARD1, BMPR1A, BRCA1, BRCA2, BRIP1, CDC73, CDH1, CDK4, CDKN1B, CDKN2A, CEBPA, CHEK2, CTNNA1, DDX41, DICER1, ETV6, FH, FLCN, GATA2, LZTR1, MAX, MBD4, MEN1, MET, MLH1, MSH2, MSH3, MSH6, MUTYH, NF1, NF2, NTHL1, PALB2, PHOX2B, PMS2, POT1, PRKAR1A, PTCH1, PTEN, RAD51C, RAD51D, RB1, RET, RPS20, RUNX1, SDHA, SDHAF2, SDHB, SDHC, SDHD, SMAD4, SMARCA4, SMARCB1, SMARCE1, STK11, SUFU, TMEM127, TP53, TSC1, TSC2, VHL, and WT1 (sequencing and deletion/duplication); AXIN2, CTNNA1, DDX41, EGFR, HOXB13, KIT, MBD4, MITF, MSH3, PDGFRA, POLD1 and POLE (sequencing only); EPCAM and GREM1 (deletion/duplication only). RNA data is routinely analyzed for use in variant  interpretation for all genes.      INTERVAL HISTORY:  Discussed the use of AI scribe software for clinical note transcription with the patient, who gave verbal consent to proceed.  History of Present Illness Anna Mann is a 64 year old female with stage 0, ER/PR-positive ductal carcinoma in situ, status post lumpectomy and adjuvant radiation, currently on anastrozole , who presents for oncology follow-up and management of treatment-related symptoms.  She takes anastrozole  daily but not at a consistent time. She has intense but tolerable hot flashes and wants to continue therapy. She denies vaginal dryness. Since starting anastrozole  she has persistent hunger after meals and difficulty losing weight, and is considering dietary changes including a plant-based or vegan diet. She discussed semaglutide  with her primary care provider but is hesitant because of concern about hypoglycemia and her daughter's adverse experience.  She has ongoing arthralgias, worst in the right knee with prior ACL reconstruction and severe degeneration on imaging, with possible future need for knee replacement. She notes new left knee pain and describes her ankles as feeling fragile with a sensation they may break while walking. She is unsure if these symptoms are related to anastrozole .  She reports new soreness, induration, daily discomfort, and swelling in the right breast localized to the radiated area, developing since her last radiation treatment in October 2025. She avoids sleeping on that side. She has occasional sharp pains and twinges in the right breast that have lessened in intensity and frequency over time. She had no prior chronic breast pain or seroma after lumpectomy until these recent symptoms.    REVIEW OF SYSTEMS:  Review of Systems  Constitutional:  Negative for appetite change, chills, fatigue, fever and unexpected weight change.  HENT:   Negative for hearing loss, lump/mass and trouble  swallowing.   Eyes:  Negative for eye problems and icterus.  Respiratory:  Negative for chest tightness, cough and shortness of breath.   Cardiovascular:  Negative for chest pain, leg swelling and palpitations.  Gastrointestinal:  Negative for abdominal distention, abdominal pain, constipation, diarrhea, nausea and vomiting.  Endocrine: Positive for hot flashes.  Genitourinary:  Negative for difficulty urinating.   Musculoskeletal:  Negative for arthralgias.  Skin:  Negative for itching and rash.  Neurological:  Negative for dizziness, extremity weakness, headaches and numbness.  Hematological:  Negative for adenopathy. Does not bruise/bleed easily.  Psychiatric/Behavioral:  Negative for depression. The patient is not nervous/anxious.    Breast: Denies any new nodularity, masses, tenderness, nipple changes, or nipple discharge.       PAST MEDICAL/SURGICAL HISTORY:  Past Medical History:  Diagnosis Date   Abnormal Pap smear    colposcopy   Abnormal uterine bleeding (AUB)    Allergy    Diabetes mellitus without complication (HCC)    Family history of breast cancer    Family history of colon cancer    Family history of colon cancer    Family history of Hodgkin lymphoma    Family history of ovarian cancer    GERD (gastroesophageal reflux disease)    Heart murmur    Hyperlipidemia    Hypertension    Vertigo    hx   Past Surgical History:  Procedure Laterality Date   BREAST BIOPSY Right 07/10/2024   MM RT BREAST BX W LOC DEV 1ST LESION IMAGE BX SPEC STEREO GUIDE 07/10/2024 [facility]   BREAST BIOPSY  07/29/2024   MM RT RADIOACTIVE SEED LOC MAMMO GUIDE 07/29/2024 [facility]   COLONOSCOPY  2023   COLPOSCOPY     DILATATION & CURETTAGE/HYSTEROSCOPY WITH MYOSURE N/A 05/25/2021   Procedure: DILATATION & CURETTAGE/HYSTEROSCOPY WITH MYOSURE;  Surgeon: Storm Setter, DO;  Location: [facility];  Service: Gynecology;  Laterality: N/A;   DILATION AND  CURETTAGE OF UTERUS     KNEE ARTHROSCOPY W/ ACL RECONSTRUCTION Left    LAPAROSCOPY N/A 05/25/2021   Procedure: LAPAROSCOPY DIAGNOSTIC,;  Surgeon: Storm Setter, DO;  Location: [facility];  Service: Gynecology;  Laterality: N/A;   TUBAL LIGATION     UPPER GI ENDOSCOPY  2023     ALLERGIES:  Allergies[1]   CURRENT MEDICATIONS:  Outpatient Encounter Medications as of 12/19/2024  Medication Sig   anastrozole  (ARIMIDEX ) 1 MG tablet Take 1 tablet (1 mg total) by mouth daily.   atorvastatin  (LIPITOR) 20 MG tablet Take 1 tablet (20 mg total) by mouth daily.   Blood Glucose Monitoring Suppl DEVI 1 each by Does not apply route as directed. Dispense based on patient and insurance preference. Use up to four times daily as directed. (FOR ICD-10 E10.9, E11.9).   empagliflozin  (JARDIANCE ) 25 MG TABS tablet Take 1 tablet (25 mg total) by mouth daily.   fluconazole  (DIFLUCAN ) 100 MG tablet 2 tablets by mouth the first day, then 1 tablet daily for 6 more days. (Patient not taking: Reported on 12/18/2024)   Glucose Blood (BLOOD GLUCOSE TEST STRIPS) STRP 1 each by Does not apply route as directed. Dispense based on patient and insurance preference. Use up to four times daily as directed. (FOR ICD-10 E10.9, E11.9).   glucose blood (TRUE METRIX BLOOD GLUCOSE TEST) test strip Use as instructed. Check blood glucose level by fingerstick twice per day.  E11.65   glucose blood  test strip Use as instructed   Lancet Device MISC 1 each by Does not apply route as directed. Dispense based on patient and insurance preference. Use up to four times daily as directed. (FOR ICD-10 E10.9, E11.9).   Lancet Device MISC 1 each by Does not apply route as directed. Dispense based on patient and insurance preference. Use up to four times daily as directed. (FOR ICD-10 E10.9, E11.9).   Lancet Devices (LANCET DEVICE WITH EJECTOR) MISC Check your blood sugars twice daily   Lancets MISC 1 each by Does not apply route as  directed. Dispense based on patient and insurance preference. Use up to four times daily as directed. (FOR ICD-10 E10.9, E11.9).   lisinopril  (ZESTRIL ) 20 MG tablet Take 1 tablet (20 mg total) by mouth daily.   nystatin  cream (MYCOSTATIN ) Apply 1 Application topically 2 (two) times daily.   omeprazole  (PRILOSEC) 20 MG capsule Take 1 capsule (20 mg total) by mouth 2 (two) times daily before a meal.   ondansetron  (ZOFRAN -ODT) 4 MG disintegrating tablet Dissolve 1 tablet (4 mg total) by mouth every 8 (eight) hours as needed for nausea or vomiting. (Patient not taking: Reported on 12/18/2024)   Semaglutide ,0.25 or 0.5MG /DOS, (OZEMPIC , 0.25 OR 0.5 MG/DOSE,) 2 MG/3ML SOPN Inject 0.25 mg into the skin once a week. Increase dose to 0.5 mg weekly after 3 to 4 weeks if side effects tolerated (Patient not taking: Reported on 12/18/2024)   TRUEplus Lancets 28G MISC Use as instructed. Check blood glucose level by fingerstick twice per day.   [DISCONTINUED] nystatin  (MYCOSTATIN /NYSTOP ) powder Apply 1 Application topically 3 (three) times daily. For 2-3 weeks (Patient not taking: Reported on 12/18/2024)   No facility-administered encounter medications on file as of 12/19/2024.     ONCOLOGIC FAMILY HISTORY:  Family History  Problem Relation Age of Onset   Aneurysm Mother    Heart disease Mother    Diabetes Mother    Hypertension Mother    Colon cancer Father 53 - 22   Diabetes Sister    Kidney failure Sister    Heart failure Sister    Hodgkin's lymphoma Sister    Diabetes Brother    Heart disease Brother    Congestive Heart Failure Brother    Heart disease Brother    Diabetes Brother    Pancreatitis Brother        potentially pancreatic cancer   Heart failure Brother    Ovarian cancer Maternal Aunt        2021   Colon cancer Maternal Aunt        may have also had breast cancer prior to this colon cancer   Esophageal cancer Paternal Aunt 60 - 79   Diabetes Maternal Grandmother    Hypertension Maternal  Grandmother    Congestive Heart Failure Maternal Grandmother    Heart disease Paternal Grandmother    Hypertension Paternal Grandmother    Stroke Paternal Grandfather    Diabetes Daughter        her father had colon cancer at age 30 and died from colon cancer   Other Daughter        pre diabetic   Breast cancer Cousin    Kidney failure Nephew    Diabetes Nephew    Hodgkin's lymphoma Niece 93   Breast cancer Maternal Cousin    Colon polyps Neg Hx    Stomach cancer Neg Hx    Sleep apnea Neg Hx      SOCIAL HISTORY:  Social History  Socioeconomic History   Marital status: Single    Spouse name: Not on file   Number of children: 3   Years of education: Not on file   Highest education level: Associate degree: occupational, scientist, product/process development, or vocational program  Occupational History   Occupation: Psychologist, sport and exercise rep  Tobacco Use   Smoking status: Never    Passive exposure: Never   Smokeless tobacco: Never  Vaping Use   Vaping status: Never Used  Substance and Sexual Activity   Alcohol use: No   Drug use: No   Sexual activity: Not Currently    Birth control/protection: Post-menopausal  Other Topics Concern   Not on file  Social History Narrative   Pt doesn't work    Pt lives alone    Social Drivers of Health   Tobacco Use: Low Risk (12/19/2024)   Patient History    Smoking Tobacco Use: Never    Smokeless Tobacco Use: Never    Passive Exposure: Never  Financial Resource Strain: Low Risk (11/20/2024)   Overall Financial Resource Strain (CARDIA)    Difficulty of Paying Living Expenses: Not hard at all  Food Insecurity: No Food Insecurity (12/18/2024)   Epic    Worried About Programme Researcher, Broadcasting/film/video in the Last Year: Never true    Ran Out of Food in the Last Year: Never true  Transportation Needs: Unknown (11/20/2024)   Epic    Lack of Transportation (Medical): No    Lack of Transportation (Non-Medical): Patient declined  Physical Activity: Unknown (11/20/2024)   Exercise Vital  Sign    Days of Exercise per Week: Patient declined    Minutes of Exercise per Session: Not on file  Stress: No Stress Concern Present (11/20/2024)   [employer] of Occupational Health - Occupational Stress Questionnaire    Feeling of Stress: Not at all  Social Connections: Moderately Integrated (11/20/2024)   Social Connection and Isolation Panel    Frequency of Communication with Friends and Family: More than three times a week    Frequency of Social Gatherings with Friends and Family: More than three times a week    Attends Religious Services: More than 4 times per year    Active Member of Clubs or Organizations: Yes    Attends Banker Meetings: More than 4 times per year    Marital Status: Divorced  Intimate Partner Violence: Not At Risk (08/13/2024)   Epic    Fear of Current or Ex-Partner: No    Emotionally Abused: No    Physically Abused: No    Sexually Abused: No  Depression (PHQ2-9): Low Risk (12/18/2024)   Depression (PHQ2-9)    PHQ-2 Score: 0  Recent Concern: Depression (PHQ2-9) - Medium Risk (10/28/2024)   Depression (PHQ2-9)    PHQ-2 Score: 10  Alcohol Screen: Not on file  Housing: Low Risk (11/20/2024)   Epic    Unable to Pay for Housing in the Last Year: No    Number of Times Moved in the Last Year: 0    Homeless in the Last Year: No  Utilities: Not At Risk (08/20/2024)   Epic    Threatened with loss of utilities: No  Health Literacy: Not on file     OBSERVATIONS/OBJECTIVE:  BP (!) 120/56 (BP Location: Left Arm, Patient Position: Sitting)   Pulse 64   Temp 97.7 F (36.5 C) (Temporal)   Resp 16   Wt 247 lb (112 kg)   LMP  (LMP Unknown) Comment: irregular  SpO2 92%  BMI 43.75 kg/m  GENERAL: Patient is a well appearing female in no acute distress HEENT:  Sclerae anicteric.  Oropharynx clear and moist. No ulcerations or evidence of oropharyngeal candidiasis. Neck is supple.  NODES:  No cervical, supraclavicular, or axillary lymphadenopathy  palpated.  BREAST EXAM:  right breast s/p lumpectomy and radiation, no sign of local recurrence; left breast benign LUNGS:  Clear to auscultation bilaterally.  No wheezes or rhonchi. HEART:  Regular rate and rhythm. No murmur appreciated. ABDOMEN:  Soft, nontender.  Positive, normoactive bowel sounds. No organomegaly palpated. MSK:  No focal spinal tenderness to palpation. Full range of motion bilaterally in the upper extremities. EXTREMITIES:  No peripheral edema.   SKIN:  Clear with no obvious rashes or skin changes. No nail dyscrasia. NEURO:  Nonfocal. Well oriented.  Appropriate affect.   LABORATORY DATA:  None for this visit.  DIAGNOSTIC IMAGING:  None for this visit.      ASSESSMENT AND PLAN:  Anna Mann is a pleasant 64 y.o. female with Stage 0 right breast DCIS, ER+/PR+, diagnosed in 07/2024, treated with lumpectomy, adjuvant radiation therapy, and anti-estrogen therapy with Anastrozole  beginning in 09/2024.  She presents to the Survivorship Clinic for our initial meeting and routine follow-up post-completion of treatment for breast cancer.    1. Stage 0 right breast cancer:  Ms. Fesperman is continuing to recover from definitive treatment for breast cancer. She will follow-up with her medical oncologist, Dr.  Odean in 6 months with history and physical exam per surveillance protocol.  She will continue her anti-estrogen therapy with Anastrozole . Thus far, she is tolerating the Anastrozole  well, with minimal side effects. Her mammogram is due 05/2025; orders placed today.   Today, a comprehensive survivorship care plan and treatment summary was reviewed with the patient today detailing her breast cancer diagnosis, treatment course, potential late/long-term effects of treatment, appropriate follow-up care with recommendations for the future, and patient education resources.  A copy of this summary, along with a letter will be sent to the patients primary care provider via mail/fax/In  Basket message after todays visit.    2. Bone health:  Given Ms. Scriven's age/history of breast cancer and her current treatment regimen including anti-estrogen therapy with Anastrozole , she is at risk for bone demineralization.  Her last DEXA scan was 10/20/2024, and was normal. Repeat is recommended in 10/2026. She was given education on specific activities to promote bone health.  3. Cancer screening:  Due to Ms. Rosman's history and her age, she should receive screening for skin cancers, colon cancer, and gynecologic cancers.  The information and recommendations are listed on the patient's comprehensive care plan/treatment summary and were reviewed in detail with the patient.    4. Health maintenance and wellness promotion: Ms. Rabadi was encouraged to consume 5-7 servings of fruits and vegetables per day. We reviewed the Nutrition Rainbow handout.  She was also encouraged to engage in moderate to vigorous exercise for 30 minutes per day most days of the week.  She was instructed to limit her alcohol consumption and continue to abstain from tobacco use.     5. Support services/counseling: It is not uncommon for this period of the patient's cancer care trajectory to be one of many emotions and stressors.   She was given information regarding our available services and encouraged to contact me with any questions or for help enrolling in any of our support group/programs.    Follow up instructions:    -Return to cancer center in 6  months for f/u with Dr. Gudena  -Mammogram due in 05/2025 -DEXA 10/2026 -She is welcome to return back to the Survivorship Clinic at any time; no additional follow-up needed at this time.  -Consider referral back to survivorship as a long-term survivor for continued surveillance  The patient was provided an opportunity to ask questions and all were answered. The patient agreed with the plan and demonstrated an understanding of the instructions.   Total encounter  time:45 minutes*in face-to-face visit time, chart review, lab review, care coordination, order entry, and documentation of the encounter time.    Morna Kendall, NP 12/19/2024 1:14 PM Medical Oncology and Hematology [facility] [address] [place], [place] [address] Tel. [phone]    Fax. [phone]  *Total Encounter Time as defined by the Centers for Medicare and Medicaid Services includes, in addition to the face-to-face time of a patient visit (documented in the note above) non-face-to-face time: obtaining and reviewing outside history, ordering and reviewing medications, tests or procedures, care coordination (communications with other health care professionals or caregivers) and documentation in the medical record.      [1]  Allergies Allergen Reactions   Iodine Cough, Hives, Shortness Of Breath, Swelling and Anaphylaxis   Ivp Dye [Iodinated Contrast Media] Anaphylaxis, Hives, Itching and Swelling   Shellfish Allergy Anaphylaxis, Hives and Itching

## 2024-12-22 ENCOUNTER — Ambulatory Visit: Admitting: Orthopaedic Surgery

## 2024-12-25 NOTE — Progress Notes (Signed)
 " [facility] [address] [place]. [place] [address] [phone]       OFFICE FOLLOW UP NOTE  [name] Date of Birth:  07-05-1961 Medical Record Number:  [id number]    Primary neurologist: Dr. Buck Reason for visit: Initial CPAP follow-up    SUBJECTIVE:   CHIEF COMPLAINT:  Chief Complaint  Patient presents with   Follow-up    Patient in room 3 alone. Patient is here for cpap follow up. Patient states that the machine is working wonderful, but she's not getting the same amount of air that she was once she started using the machine. Patient has spoken to [facility], Patient stated that now she's back having the morning grogginess.  ESS score is 16    Follow-up visit:  Prior visit: 08/19/2024  Brief HPI:   Anna Mann is a 64 y.o. female with PMH of breast cancer s/p lumpectomy, AUB, DM, HTN, HLD, vertigo and severe obesity who was evaluated for suspected underlying sleep disorder with complaints of excessive daytime somnolence, witnessed apneas, and nonrestorative sleep.  ESS 20/24.  In-lab sleep study 09/2024 showed overall mild OSA with a total AHI 9.6/h although more pronounced during REM sleep with a REM AHI 38.7 and supine REM AHI 47.8, O2 nadir of 78%.  AutoPap therapy initiated 10/2024.     Interval history:  Patient returns for initial CPAP compliance visit.  She reports initially upon starting CPAP, she noted significant improvement of sleep quality and daytime energy levels.  She was feeling more rested upon awakening and able to sleep for longer duration as well as improvement of brain fog sensation.  Reports over the past month, since replacing hose to N20 mask, she is feeling more daytime fatigue, not feeling as rested upon awakening, and doesn't feel like enough air is blowing out.  She has also been experiencing increased dry mouth and jaw soreness, she questions teeth grinding.  She does use a chinstrap but doesn't feel this works  adequately, has also tried mouth tape but unable to tolerate.  She tried N30 mask but had difficulty tolerating.  ESS 16/24.  She also mentions bloated sensation upon awakening and greatly worsens after eating small meal during the day which she feels has worsened since initiating CPAP; she reports small bump above umbilical area that worsens after she eats a small meal. She does have prior hx of GERD but seems like this improved after starting CPAP.  She has longstanding history of irregular bowel movements.  She recently was seen by OBGYN who discussed GI evaluation but declined until further discussion today.  She was prescribed Ozempic  recently by PCP for underlying diabetes and to assist with weight loss but has not yet started.         ROS:   14 system review of systems performed and negative with exception of those listed in HPI  PMH:  Past Medical History:  Diagnosis Date   Abnormal Pap smear    colposcopy   Abnormal uterine bleeding (AUB)    Allergy    Diabetes mellitus without complication (HCC)    Family history of breast cancer    Family history of colon cancer    Family history of colon cancer    Family history of Hodgkin lymphoma    Family history of ovarian cancer    GERD (gastroesophageal reflux disease)    Heart murmur    Hyperlipidemia    Hypertension    Vertigo    hx  PSH:  Past Surgical History:  Procedure Laterality Date   BREAST BIOPSY Right 07/10/2024   MM RT BREAST BX W LOC DEV 1ST LESION IMAGE BX SPEC STEREO GUIDE 07/10/2024 [facility]   BREAST BIOPSY  07/29/2024   MM RT RADIOACTIVE SEED LOC MAMMO GUIDE 07/29/2024 [facility]   COLONOSCOPY  2023   COLPOSCOPY     DILATATION & CURETTAGE/HYSTEROSCOPY WITH MYOSURE N/A 05/25/2021   Procedure: DILATATION & CURETTAGE/HYSTEROSCOPY WITH MYOSURE;  Surgeon: Storm Setter, DO;  Location: [facility];  Service: Gynecology;  Laterality: N/A;   DILATION AND CURETTAGE OF UTERUS      KNEE ARTHROSCOPY W/ ACL RECONSTRUCTION Left    LAPAROSCOPY N/A 05/25/2021   Procedure: LAPAROSCOPY DIAGNOSTIC,;  Surgeon: Storm Setter, DO;  Location: [facility];  Service: Gynecology;  Laterality: N/A;   TUBAL LIGATION     UPPER GI ENDOSCOPY  2023    Social History:  Social History   Socioeconomic History   Marital status: Single    Spouse name: Not on file   Number of children: 3   Years of education: Not on file   Highest education level: Associate degree: occupational, scientist, product/process development, or vocational program  Occupational History   Occupation: Psychologist, sport and exercise rep  Tobacco Use   Smoking status: Never    Passive exposure: Never   Smokeless tobacco: Never  Vaping Use   Vaping status: Never Used  Substance and Sexual Activity   Alcohol use: No   Drug use: No   Sexual activity: Not Currently    Birth control/protection: Post-menopausal  Other Topics Concern   Not on file  Social History Narrative   Pt doesn't work    Pt lives alone    Social Drivers of Health   Tobacco Use: Low Risk (12/29/2024)   Patient History    Smoking Tobacco Use: Never    Smokeless Tobacco Use: Never    Passive Exposure: Never  Financial Resource Strain: Low Risk (12/19/2024)   Overall Financial Resource Strain (CARDIA)    Difficulty of Paying Living Expenses: Not hard at all  Food Insecurity: No Food Insecurity (12/19/2024)   Epic    Worried About Radiation Protection Practitioner of Food in the Last Year: Never true    Ran Out of Food in the Last Year: Never true  Transportation Needs: No Transportation Needs (12/19/2024)   Epic    Lack of Transportation (Medical): No    Lack of Transportation (Non-Medical): No  Physical Activity: Unknown (11/20/2024)   Exercise Vital Sign    Days of Exercise per Week: Patient declined    Minutes of Exercise per Session: Not on file  Stress: No Stress Concern Present (12/19/2024)   [employer] of Occupational Health - Occupational Stress Questionnaire    Feeling of  Stress: Not at all  Social Connections: Moderately Integrated (11/20/2024)   Social Connection and Isolation Panel    Frequency of Communication with Friends and Family: More than three times a week    Frequency of Social Gatherings with Friends and Family: More than three times a week    Attends Religious Services: More than 4 times per year    Active Member of [employer] or Organizations: Yes    Attends Banker Meetings: More than 4 times per year    Marital Status: Divorced  Intimate Partner Violence: Not At Risk (12/19/2024)   Epic    Fear of Current or Ex-Partner: No    Emotionally Abused: No    Physically Abused:  No    Sexually Abused: No  Depression (PHQ2-9): Low Risk (12/19/2024)   Depression (PHQ2-9)    PHQ-2 Score: 0  Recent Concern: Depression (PHQ2-9) - Medium Risk (10/28/2024)   Depression (PHQ2-9)    PHQ-2 Score: 10  Alcohol Screen: Low Risk (12/19/2024)   Alcohol Screen    Last Alcohol Screening Score (AUDIT): 0  Housing: Unknown (12/19/2024)   Epic    Unable to Pay for Housing in the Last Year: No    Number of Times Moved in the Last Year: Not on file    Homeless in the Last Year: No  Utilities: Not At Risk (12/19/2024)   Epic    Threatened with loss of utilities: No  Health Literacy: Adequate Health Literacy (12/19/2024)   B1300 Health Literacy    Frequency of need for help with medical instructions: Never    Family History:  Family History  Problem Relation Age of Onset   Aneurysm Mother    Heart disease Mother    Diabetes Mother    Hypertension Mother    Colon cancer Father 54 - 51   Diabetes Sister    Kidney failure Sister    Heart failure Sister    Hodgkin's lymphoma Sister    Diabetes Brother    Heart disease Brother    Congestive Heart Failure Brother    Heart disease Brother    Diabetes Brother    Pancreatitis Brother        potentially pancreatic cancer   Heart failure Brother    Ovarian cancer Maternal Aunt        2021   Colon cancer  Maternal Aunt        may have also had breast cancer prior to this colon cancer   Esophageal cancer Paternal Aunt 46 - 79   Diabetes Maternal Grandmother    Hypertension Maternal Grandmother    Congestive Heart Failure Maternal Grandmother    Heart disease Paternal Grandmother    Hypertension Paternal Grandmother    Stroke Paternal Grandfather    Diabetes Daughter        her father had colon cancer at age 11 and died from colon cancer   Other Daughter        pre diabetic   Breast cancer Cousin    Kidney failure Nephew    Diabetes Nephew    Hodgkin's lymphoma Niece 40   Breast cancer Maternal Cousin    Colon polyps Neg Hx    Stomach cancer Neg Hx    Sleep apnea Neg Hx     Medications:  Medications Ordered Prior to Encounter[1]  Allergies:  Allergies[2]    OBJECTIVE:  Physical Exam  Vitals:   12/29/24 1231  BP: 121/67  Pulse: 66  Weight: 246 lb 9.6 oz (111.9 kg)  Height: 5' 2 (1.575 m)   Body mass index is 45.1 kg/m. No results found.  General: well developed, well nourished, seated, in no evident distress Head: head normocephalic and atraumatic.   Neck: supple with no carotid or supraclavicular bruits Cardiovascular: regular rate and rhythm, no murmurs  Neurologic Exam Mental Status: Awake and fully alert. Oriented to place and time. Recent and remote memory intact. Attention span, concentration and fund of knowledge appropriate. Mood and affect appropriate.  Cranial Nerves: Pupils equal, briskly reactive to light. Extraocular movements full without nystagmus. Visual fields full to confrontation. Hearing intact. Facial sensation intact. Face, tongue, palate moves normally and symmetrically.  Motor: Normal bulk and tone. Normal strength in all tested  extremity muscles Gait and Station: Arises from chair without difficulty. Stance is normal. Gait demonstrates normal stride length and balance without use of AD.         ASSESSMENT/PLAN: Anna Mann is a 64  y.o. year old female    OSA on CPAP :  Compliance report shows satisfactory usage with optimal residual AHI.   She is concerned that she is not getting enough air. She is maxing out pressure settings. Will adjust pressure from 7-13 to 7-15 and will repeat download in 2 months. She also mentions bloating sensation and advised to monitor for any worsening with increased pressure. Concern she is possibly mouth breathing further leading to this symptom, request she try F40 mask.  Also discussed sleeping with head of bed raised slightly. Discussed continued nightly usage with ensuring greater than 4 hours nightly for optimal benefit and per insurance purposes.   Continue to follow with DME company [facility] for any needed supplies or CPAP related concerns CPAP set up 10/2024  GI concerns: Bloating (worse after CPAP) but also c/o bloating and palpable bump top of umbilical area after eating small meals mid day - concern she is having more issues than just aerophagia from CPAP machine. Will place referral to GI Dr. Leigh (whom she has seen previously) for further evaluation     Follow up in 1 year or call earlier if needed   CC:  PCP: Purcell Emil Schanz, MD    I personally spent a total of 30 minutes in the care of the patient today including preparing to see the patient, getting/reviewing separately obtained history, performing a medically appropriate exam/evaluation, counseling and educating, placing orders, and documenting clinical information in the EHR.  Harlene Bogaert, AGNP-BC  [facility] [address] [place], [place] [address]  Phone [phone] Fax [phone] Note: This document was prepared with digital dictation and possible smart phrase technology. Any transcriptional errors that result from this process are unintentional.         [1]  Current Outpatient Medications on File Prior to Visit  Medication Sig Dispense Refill    anastrozole  (ARIMIDEX ) 1 MG tablet Take 1 tablet (1 mg total) by mouth daily. 90 tablet 3   atorvastatin  (LIPITOR) 20 MG tablet Take 1 tablet (20 mg total) by mouth daily. 90 tablet 3   Blood Glucose Monitoring Suppl DEVI 1 each by Does not apply route as directed. Dispense based on patient and insurance preference. Use up to four times daily as directed. (FOR ICD-10 E10.9, E11.9). 1 each 0   empagliflozin  (JARDIANCE ) 25 MG TABS tablet Take 1 tablet (25 mg total) by mouth daily. 90 tablet 3   Glucose Blood (BLOOD GLUCOSE TEST STRIPS) STRP 1 each by Does not apply route as directed. Dispense based on patient and insurance preference. Use up to four times daily as directed. (FOR ICD-10 E10.9, E11.9). 100 strip 0   glucose blood (TRUE METRIX BLOOD GLUCOSE TEST) test strip Use as instructed. Check blood glucose level by fingerstick twice per day.  E11.65 100 each 12   Lancet Device MISC 1 each by Does not apply route as directed. Dispense based on patient and insurance preference. Use up to four times daily as directed. (FOR ICD-10 E10.9, E11.9). 1 each 0   lisinopril  (ZESTRIL ) 20 MG tablet Take 1 tablet (20 mg total) by mouth daily. 90 tablet 3   nystatin  cream (MYCOSTATIN ) Apply 1 Application topically 2 (two) times daily. 30 g 1   omeprazole  (PRILOSEC) 20  MG capsule Take 1 capsule (20 mg total) by mouth 2 (two) times daily before a meal. 180 capsule 1   ondansetron  (ZOFRAN -ODT) 4 MG disintegrating tablet Dissolve 1 tablet (4 mg total) by mouth every 8 (eight) hours as needed for nausea or vomiting. 30 tablet 1   TRUEplus Lancets 28G MISC Use as instructed. Check blood glucose level by fingerstick twice per day. 100 each 3   fluconazole  (DIFLUCAN ) 100 MG tablet 2 tablets by mouth the first day, then 1 tablet daily for 6 more days. (Patient not taking: Reported on 12/29/2024) 8 tablet 0   glucose blood test strip Use as instructed 100 each 12   Lancet Device MISC 1 each by Does not apply route as directed.  Dispense based on patient and insurance preference. Use up to four times daily as directed. (FOR ICD-10 E10.9, E11.9). 1 each 0   Lancet Devices (LANCET DEVICE WITH EJECTOR) MISC Check your blood sugars twice daily 2 each 0   Lancets MISC 1 each by Does not apply route as directed. Dispense based on patient and insurance preference. Use up to four times daily as directed. (FOR ICD-10 E10.9, E11.9). 100 each 0   Semaglutide ,0.25 or 0.5MG /DOS, (OZEMPIC , 0.25 OR 0.5 MG/DOSE,) 2 MG/3ML SOPN Inject 0.25 mg into the skin once a week. Increase dose to 0.5 mg weekly after 3 to 4 weeks if side effects tolerated (Patient not taking: Reported on 12/18/2024) 3 mL 2   No current facility-administered medications on file prior to visit.  [2]  Allergies Allergen Reactions   Iodine Cough, Hives, Shortness Of Breath, Swelling and Anaphylaxis   Ivp Dye [Iodinated Contrast Media] Anaphylaxis, Hives, Itching and Swelling   Shellfish Allergy Anaphylaxis, Hives and Itching   "

## 2024-12-29 ENCOUNTER — Ambulatory Visit: Admitting: Adult Health

## 2024-12-29 ENCOUNTER — Encounter: Payer: Self-pay | Admitting: Adult Health

## 2024-12-29 ENCOUNTER — Encounter: Admitting: Adult Health

## 2024-12-29 ENCOUNTER — Telehealth: Payer: Self-pay | Admitting: Neurology

## 2024-12-29 VITALS — BP 121/67 | HR 66 | Ht 62.0 in | Wt 246.6 lb

## 2024-12-29 DIAGNOSIS — G4733 Obstructive sleep apnea (adult) (pediatric): Secondary | ICD-10-CM | POA: Diagnosis not present

## 2024-12-29 DIAGNOSIS — R140 Abdominal distension (gaseous): Secondary | ICD-10-CM

## 2024-12-29 NOTE — Progress Notes (Signed)
 SABRA

## 2024-12-29 NOTE — Telephone Encounter (Signed)
 Referral sent through [facility] to [facility] to see Dr. Elspeth Naval. Phone: [phone], Fax: [phone]

## 2024-12-29 NOTE — Patient Instructions (Addendum)
 Your Plan:  Continue nightly use of CPAP with ensuring greater than 4 hours per night for optimal benefit  Will adjust pressure settings - you will see this change on your machine tonight. Please let me know after 1-2 weeks if symptoms persist   Will request mask change to a type of full face mask - you will be called by [facility] to set this up   Continue to follow-up with DME [facility] for any needed supplies and CPAP related concerns      Follow up in 1 year or call earlier if needed      Thank you for coming to see us  at [facility]. I hope we have been able to provide you high quality care today.  You may receive a patient satisfaction survey over the next few weeks. We would appreciate your feedback and comments so that we may continue to improve ourselves and the health of our patients.

## 2025-01-22 ENCOUNTER — Encounter: Admitting: Physical Therapy

## 2025-01-29 ENCOUNTER — Encounter: Admitting: Physical Therapy

## 2025-02-05 ENCOUNTER — Encounter: Admitting: Physical Therapy

## 2025-02-12 ENCOUNTER — Encounter: Admitting: Physical Therapy

## 2025-05-15 ENCOUNTER — Encounter

## 2025-05-28 ENCOUNTER — Ambulatory Visit: Admitting: Emergency Medicine

## 2025-06-18 ENCOUNTER — Inpatient Hospital Stay: Admitting: Hematology and Oncology

## 2025-07-23 ENCOUNTER — Encounter: Admitting: Emergency Medicine

## 2025-07-27 ENCOUNTER — Encounter: Admitting: Emergency Medicine

## 2025-12-29 ENCOUNTER — Ambulatory Visit: Admitting: Adult Health
